# Patient Record
Sex: Male | Born: 1955 | Race: White | Hispanic: No | Marital: Married | Smoking: Never smoker
Health system: Southern US, Community
[De-identification: ages and names within clinical notes are randomized; demographics above are authoritative.]

## PROBLEM LIST (undated history)

## (undated) DIAGNOSIS — I2510 Atherosclerotic heart disease of native coronary artery without angina pectoris: Secondary | ICD-10-CM

## (undated) DIAGNOSIS — R079 Chest pain, unspecified: Secondary | ICD-10-CM

## (undated) DIAGNOSIS — E785 Hyperlipidemia, unspecified: Secondary | ICD-10-CM

## (undated) DIAGNOSIS — I2109 ST elevation (STEMI) myocardial infarction involving other coronary artery of anterior wall: Secondary | ICD-10-CM

## (undated) DIAGNOSIS — I10 Essential (primary) hypertension: Secondary | ICD-10-CM

## (undated) DIAGNOSIS — I219 Acute myocardial infarction, unspecified: Secondary | ICD-10-CM

## (undated) DIAGNOSIS — I455 Other specified heart block: Secondary | ICD-10-CM

## (undated) DIAGNOSIS — R0683 Snoring: Secondary | ICD-10-CM

## (undated) HISTORY — DX: Hyperlipidemia, unspecified: E78.5

## (undated) HISTORY — DX: Essential (primary) hypertension: I10

## (undated) HISTORY — DX: Atherosclerotic heart disease of native coronary artery without angina pectoris: I25.10

## (undated) HISTORY — PX: KNEE SURGERY: SHX244

## (undated) HISTORY — DX: Chest pain, unspecified: R07.9

## (undated) HISTORY — DX: ST elevation (STEMI) myocardial infarction involving other coronary artery of anterior wall: I21.09

## (undated) HISTORY — PX: SHOULDER SURGERY: SHX246

## (undated) HISTORY — DX: Other specified heart block: I45.5

## (undated) HISTORY — DX: Acute myocardial infarction, unspecified: I21.9

## (undated) HISTORY — DX: Snoring: R06.83

---

## 1999-09-09 ENCOUNTER — Encounter: Payer: Self-pay | Admitting: Sports Medicine

## 1999-09-09 ENCOUNTER — Ambulatory Visit (HOSPITAL_COMMUNITY): Admission: RE | Admit: 1999-09-09 | Discharge: 1999-09-09 | Payer: Self-pay | Admitting: Sports Medicine

## 2002-07-24 ENCOUNTER — Emergency Department (HOSPITAL_COMMUNITY): Admission: EM | Admit: 2002-07-24 | Discharge: 2002-07-24 | Payer: Self-pay | Admitting: *Deleted

## 2002-07-24 ENCOUNTER — Encounter: Payer: Self-pay | Admitting: *Deleted

## 2008-05-05 ENCOUNTER — Ambulatory Visit: Payer: Self-pay | Admitting: Family Medicine

## 2009-08-25 ENCOUNTER — Ambulatory Visit: Payer: Self-pay | Admitting: Family Medicine

## 2009-09-29 ENCOUNTER — Ambulatory Visit: Payer: Self-pay | Admitting: Family Medicine

## 2009-10-29 DIAGNOSIS — I2109 ST elevation (STEMI) myocardial infarction involving other coronary artery of anterior wall: Secondary | ICD-10-CM

## 2009-10-29 HISTORY — DX: ST elevation (STEMI) myocardial infarction involving other coronary artery of anterior wall: I21.09

## 2009-11-12 ENCOUNTER — Ambulatory Visit: Payer: Self-pay | Admitting: Family Medicine

## 2009-11-13 ENCOUNTER — Inpatient Hospital Stay (HOSPITAL_COMMUNITY): Admission: AD | Admit: 2009-11-13 | Discharge: 2009-11-15 | Payer: Self-pay | Admitting: Cardiovascular Disease

## 2009-11-13 HISTORY — PX: CORONARY ANGIOPLASTY WITH STENT PLACEMENT: SHX49

## 2009-12-03 ENCOUNTER — Encounter (HOSPITAL_COMMUNITY)
Admission: RE | Admit: 2009-12-03 | Discharge: 2010-03-03 | Payer: Self-pay | Source: Home / Self Care | Attending: Cardiovascular Disease | Admitting: Cardiovascular Disease

## 2010-01-27 ENCOUNTER — Inpatient Hospital Stay (HOSPITAL_COMMUNITY): Admission: AD | Admit: 2010-01-27 | Discharge: 2010-01-28 | Payer: Self-pay | Admitting: Cardiovascular Disease

## 2010-01-29 HISTORY — PX: CARDIAC CATHETERIZATION: SHX172

## 2010-02-28 HISTORY — PX: NM MYOCAR PERF WALL MOTION: HXRAD629

## 2010-03-04 ENCOUNTER — Encounter (HOSPITAL_COMMUNITY)
Admission: RE | Admit: 2010-03-04 | Discharge: 2010-03-30 | Payer: Self-pay | Source: Home / Self Care | Attending: Cardiovascular Disease | Admitting: Cardiovascular Disease

## 2010-04-06 ENCOUNTER — Other Ambulatory Visit: Payer: Self-pay | Admitting: Cardiovascular Disease

## 2010-04-06 DIAGNOSIS — M545 Low back pain, unspecified: Secondary | ICD-10-CM

## 2010-04-07 ENCOUNTER — Other Ambulatory Visit: Payer: Self-pay

## 2010-04-14 ENCOUNTER — Ambulatory Visit
Admission: RE | Admit: 2010-04-14 | Discharge: 2010-04-14 | Disposition: A | Discharge status: Home / Self Care | Payer: PRIVATE HEALTH INSURANCE | Source: Ambulatory Visit | Attending: Cardiovascular Disease | Admitting: Cardiovascular Disease

## 2010-04-14 DIAGNOSIS — M545 Low back pain, unspecified: Secondary | ICD-10-CM

## 2010-05-11 LAB — PROTIME-INR
INR: 1.04 (ref 0.00–1.49)
INR: 1.07 (ref 0.00–1.49)
Prothrombin Time: 13.8 seconds (ref 11.6–15.2)
Prothrombin Time: 14.1 seconds (ref 11.6–15.2)

## 2010-05-11 LAB — TSH: TSH: 4.923 u[IU]/mL — ABNORMAL HIGH (ref 0.350–4.500)

## 2010-05-11 LAB — DIFFERENTIAL
Basophils Absolute: 0 10*3/uL (ref 0.0–0.1)
Basophils Relative: 1 % (ref 0–1)
Eosinophils Absolute: 0.1 10*3/uL (ref 0.0–0.7)
Eosinophils Relative: 3 % (ref 0–5)
Lymphocytes Relative: 30 % (ref 12–46)
Lymphs Abs: 1.5 10*3/uL (ref 0.7–4.0)
Monocytes Absolute: 0.5 10*3/uL (ref 0.1–1.0)
Monocytes Relative: 10 % (ref 3–12)
Neutro Abs: 2.8 10*3/uL (ref 1.7–7.7)
Neutrophils Relative %: 57 % (ref 43–77)

## 2010-05-11 LAB — CARDIAC PANEL(CRET KIN+CKTOT+MB+TROPI)
CK, MB: 2.3 ng/mL (ref 0.3–4.0)
CK, MB: 2.4 ng/mL (ref 0.3–4.0)
CK, MB: 2.8 ng/mL (ref 0.3–4.0)
Relative Index: 1.9 (ref 0.0–2.5)
Relative Index: 2.1 (ref 0.0–2.5)
Relative Index: 1.9 (ref 0.0–2.5)
Total CK: 112 U/L (ref 7–232)
Total CK: 128 U/L (ref 7–232)
Total CK: 144 U/L (ref 7–232)
Troponin I: 0.01 ng/mL (ref 0.00–0.06)
Troponin I: 0.02 ng/mL (ref 0.00–0.06)
Troponin I: 0.03 ng/mL (ref 0.00–0.06)

## 2010-05-11 LAB — URINALYSIS, ROUTINE W REFLEX MICROSCOPIC
Bilirubin Urine: NEGATIVE
Glucose, UA: NEGATIVE mg/dL
Hgb urine dipstick: NEGATIVE
Ketones, ur: NEGATIVE mg/dL
Nitrite: NEGATIVE
Protein, ur: NEGATIVE mg/dL
Specific Gravity, Urine: 1.012 (ref 1.005–1.030)
Urobilinogen, UA: 0.2 mg/dL (ref 0.0–1.0)
pH: 7.5 (ref 5.0–8.0)

## 2010-05-11 LAB — BASIC METABOLIC PANEL
BUN: 8 mg/dL (ref 6–23)
CO2: 28 mEq/L (ref 19–32)
Calcium: 8.3 mg/dL — ABNORMAL LOW (ref 8.4–10.5)
Chloride: 107 mEq/L (ref 96–112)
Creatinine, Ser: 1.02 mg/dL (ref 0.4–1.5)
GFR calc Af Amer: >60 mL/min (ref >60)
GFR calc non Af Amer: >60 mL/min (ref >60)
Glucose, Bld: 88 mg/dL (ref 70–99)
Potassium: 4.1 mEq/L (ref 3.5–5.1)
Sodium: 141 mEq/L (ref 135–145)

## 2010-05-11 LAB — HEMOGLOBIN A1C
Hgb A1c MFr Bld: 5.7 % — ABNORMAL HIGH (ref <5.7)
Mean Plasma Glucose: 117 mg/dL — ABNORMAL HIGH (ref <117)

## 2010-05-11 LAB — COMPREHENSIVE METABOLIC PANEL
ALT: 20 U/L (ref 0–53)
AST: 19 U/L (ref 0–37)
Albumin: 3.8 g/dL (ref 3.5–5.2)
Alkaline Phosphatase: 89 U/L (ref 39–117)
BUN: 11 mg/dL (ref 6–23)
CO2: 28 mEq/L (ref 19–32)
Calcium: 8.6 mg/dL (ref 8.4–10.5)
Chloride: 103 mEq/L (ref 96–112)
Creatinine, Ser: 1.1 mg/dL (ref 0.4–1.5)
GFR calc Af Amer: >60 mL/min (ref >60)
GFR calc non Af Amer: >60 mL/min (ref >60)
Glucose, Bld: 79 mg/dL (ref 70–99)
Potassium: 4 mEq/L (ref 3.5–5.1)
Sodium: 135 mEq/L (ref 135–145)
Total Bilirubin: 0.6 mg/dL (ref 0.3–1.2)
Total Protein: 6.7 g/dL (ref 6.0–8.3)

## 2010-05-11 LAB — CBC
HCT: 36 % — ABNORMAL LOW (ref 39.0–52.0)
HCT: 35.3 % — ABNORMAL LOW (ref 39.0–52.0)
Hemoglobin: 11.6 g/dL — ABNORMAL LOW (ref 13.0–17.0)
Hemoglobin: 11.7 g/dL — ABNORMAL LOW (ref 13.0–17.0)
MCH: 28.1 pg (ref 26.0–34.0)
MCH: 28.6 pg (ref 26.0–34.0)
MCHC: 32.2 g/dL (ref 30.0–36.0)
MCHC: 33.1 g/dL (ref 30.0–36.0)
MCV: 87.2 fL (ref 78.0–100.0)
MCV: 86.3 fL (ref 78.0–100.0)
Platelets: 212 10*3/uL (ref 150–400)
Platelets: 202 10*3/uL (ref 150–400)
RBC: 4.13 MIL/uL — ABNORMAL LOW (ref 4.22–5.81)
RBC: 4.09 MIL/uL — ABNORMAL LOW (ref 4.22–5.81)
RDW: 13.6 % (ref 11.5–15.5)
RDW: 13.3 % (ref 11.5–15.5)
WBC: 5.2 10*3/uL (ref 4.0–10.5)
WBC: 5 10*3/uL (ref 4.0–10.5)

## 2010-05-11 LAB — PLATELET INHIBITION P2Y12
P2Y12 % Inhibition: 4 %
Platelet Function  P2Y12: 323 [PRU] (ref 194–418)
Platelet Function Baseline: 336 [PRU] (ref 194–418)

## 2010-05-11 LAB — APTT: aPTT: 27 seconds (ref 24–37)

## 2010-05-11 LAB — MAGNESIUM: Magnesium: 2.1 mg/dL (ref 1.5–2.5)

## 2010-05-11 LAB — HEPARIN LEVEL (UNFRACTIONATED)
Heparin Unfractionated: 0.27 IU/mL — ABNORMAL LOW (ref 0.30–0.70)
Heparin Unfractionated: 0.28 IU/mL — ABNORMAL LOW (ref 0.30–0.70)

## 2010-05-13 LAB — APTT: aPTT: 26 seconds (ref 24–37)

## 2010-05-13 LAB — CARDIAC PANEL(CRET KIN+CKTOT+MB+TROPI)
CK, MB: 6 ng/mL — ABNORMAL HIGH (ref 0.3–4.0)
CK, MB: 7.3 ng/mL (ref 0.3–4.0)
Relative Index: 2.6 — ABNORMAL HIGH (ref 0.0–2.5)
Relative Index: 4 — ABNORMAL HIGH (ref 0.0–2.5)
Total CK: 181 U/L (ref 7–232)
Total CK: 228 U/L (ref 7–232)
Troponin I: 2.04 ng/mL (ref 0.00–0.06)
Troponin I: 3.1 ng/mL (ref 0.00–0.06)

## 2010-05-13 LAB — DIFFERENTIAL
Basophils Absolute: 0 10*3/uL (ref 0.0–0.1)
Basophils Relative: 1 % (ref 0–1)
Eosinophils Absolute: 0.1 10*3/uL (ref 0.0–0.7)
Eosinophils Relative: 2 % (ref 0–5)
Lymphocytes Relative: 23 % (ref 12–46)
Lymphs Abs: 1.7 10*3/uL (ref 0.7–4.0)
Monocytes Absolute: 0.9 10*3/uL (ref 0.1–1.0)
Monocytes Relative: 12 % (ref 3–12)
Neutro Abs: 4.7 10*3/uL (ref 1.7–7.7)
Neutrophils Relative %: 63 % (ref 43–77)

## 2010-05-13 LAB — BASIC METABOLIC PANEL
BUN: 13 mg/dL (ref 6–23)
BUN: 9 mg/dL (ref 6–23)
CO2: 27 mEq/L (ref 19–32)
CO2: 28 mEq/L (ref 19–32)
Calcium: 7.9 mg/dL — ABNORMAL LOW (ref 8.4–10.5)
Calcium: 8.8 mg/dL (ref 8.4–10.5)
Chloride: 107 mEq/L (ref 96–112)
Chloride: 107 mEq/L (ref 96–112)
Creatinine, Ser: 1.09 mg/dL (ref 0.4–1.5)
Creatinine, Ser: 1.15 mg/dL (ref 0.4–1.5)
GFR calc Af Amer: >60 mL/min (ref >60)
GFR calc Af Amer: >60 mL/min (ref >60)
GFR calc non Af Amer: >60 mL/min (ref >60)
GFR calc non Af Amer: >60 mL/min (ref >60)
Glucose, Bld: 103 mg/dL — ABNORMAL HIGH (ref 70–99)
Glucose, Bld: 99 mg/dL (ref 70–99)
Potassium: 4 mEq/L (ref 3.5–5.1)
Potassium: 4 mEq/L (ref 3.5–5.1)
Sodium: 138 mEq/L (ref 135–145)
Sodium: 141 mEq/L (ref 135–145)

## 2010-05-13 LAB — CBC
HCT: 32.8 % — ABNORMAL LOW (ref 39.0–52.0)
HCT: 33.4 % — ABNORMAL LOW (ref 39.0–52.0)
HCT: 34.1 % — ABNORMAL LOW (ref 39.0–52.0)
HCT: 39 % (ref 39.0–52.0)
Hemoglobin: 10.8 g/dL — ABNORMAL LOW (ref 13.0–17.0)
Hemoglobin: 11.1 g/dL — ABNORMAL LOW (ref 13.0–17.0)
Hemoglobin: 11.1 g/dL — ABNORMAL LOW (ref 13.0–17.0)
Hemoglobin: 13.4 g/dL (ref 13.0–17.0)
MCH: 27.9 pg (ref 26.0–34.0)
MCH: 28.5 pg (ref 26.0–34.0)
MCH: 28.6 pg (ref 26.0–34.0)
MCH: 29.5 pg (ref 26.0–34.0)
MCHC: 32.6 g/dL (ref 30.0–36.0)
MCHC: 32.9 g/dL (ref 30.0–36.0)
MCHC: 33.2 g/dL (ref 30.0–36.0)
MCHC: 34.4 g/dL (ref 30.0–36.0)
MCV: 85.7 fL (ref 78.0–100.0)
MCV: 85.7 fL (ref 78.0–100.0)
MCV: 86.1 fL (ref 78.0–100.0)
MCV: 86.5 fL (ref 78.0–100.0)
Platelets: 232 10*3/uL (ref 150–400)
Platelets: 235 10*3/uL (ref 150–400)
Platelets: 248 10*3/uL (ref 150–400)
Platelets: 296 10*3/uL (ref 150–400)
RBC: 3.79 MIL/uL — ABNORMAL LOW (ref 4.22–5.81)
RBC: 3.88 MIL/uL — ABNORMAL LOW (ref 4.22–5.81)
RBC: 3.98 MIL/uL — ABNORMAL LOW (ref 4.22–5.81)
RBC: 4.55 MIL/uL (ref 4.22–5.81)
RDW: 14 % (ref 11.5–15.5)
RDW: 14.3 % (ref 11.5–15.5)
RDW: 14.4 % (ref 11.5–15.5)
RDW: 14.5 % (ref 11.5–15.5)
WBC: 5.7 10*3/uL (ref 4.0–10.5)
WBC: 6.9 10*3/uL (ref 4.0–10.5)
WBC: 7 10*3/uL (ref 4.0–10.5)
WBC: 7.5 10*3/uL (ref 4.0–10.5)

## 2010-05-13 LAB — LIPID PANEL
Cholesterol: 151 mg/dL (ref 0–200)
HDL: 28 mg/dL — ABNORMAL LOW (ref >39)
LDL Cholesterol: 77 mg/dL (ref 0–99)
Total CHOL/HDL Ratio: 5.4 RATIO
Triglycerides: 229 mg/dL — ABNORMAL HIGH (ref <150)
VLDL: 46 mg/dL — ABNORMAL HIGH (ref 0–40)

## 2010-05-13 LAB — TSH: TSH: 3.949 u[IU]/mL (ref 0.350–4.500)

## 2010-05-13 LAB — COMPREHENSIVE METABOLIC PANEL
ALT: 40 U/L (ref 0–53)
AST: 34 U/L (ref 0–37)
Albumin: 4 g/dL (ref 3.5–5.2)
Alkaline Phosphatase: 91 U/L (ref 39–117)
BUN: 14 mg/dL (ref 6–23)
CO2: 27 mEq/L (ref 19–32)
Calcium: 9.4 mg/dL (ref 8.4–10.5)
Chloride: 100 mEq/L (ref 96–112)
Creatinine, Ser: 1.1 mg/dL (ref 0.4–1.5)
GFR calc Af Amer: >60 mL/min (ref >60)
GFR calc non Af Amer: >60 mL/min (ref >60)
Glucose, Bld: 85 mg/dL (ref 70–99)
Potassium: 4.2 mEq/L (ref 3.5–5.1)
Sodium: 136 mEq/L (ref 135–145)
Total Bilirubin: 0.5 mg/dL (ref 0.3–1.2)
Total Protein: 7.7 g/dL (ref 6.0–8.3)

## 2010-05-13 LAB — IRON AND TIBC
Iron: 27 ug/dL — ABNORMAL LOW (ref 42–135)
Saturation Ratios: 10 % — ABNORMAL LOW (ref 20–55)
TIBC: 282 ug/dL (ref 215–435)
UIBC: 255 ug/dL

## 2010-05-13 LAB — PROTIME-INR
INR: 1 (ref 0.00–1.49)
Prothrombin Time: 13.4 seconds (ref 11.6–15.2)

## 2010-05-13 LAB — MRSA PCR SCREENING: MRSA by PCR: NEGATIVE

## 2010-05-13 LAB — MAGNESIUM: Magnesium: 2.2 mg/dL (ref 1.5–2.5)

## 2010-12-22 ENCOUNTER — Telehealth: Payer: Self-pay | Admitting: Family Medicine

## 2010-12-22 NOTE — Telephone Encounter (Signed)
Patient hasn't been seen in over a year.  I believe he is under care of his cardiologist, and if so, can get refill from cardiologist.  Needs to schedule appt with any provider here

## 2010-12-22 NOTE — Telephone Encounter (Signed)
Refill request came over for nitrostat for patient. Called patient and left message for him to either get refill from his cardiologist or he can call our office to schedule an appt with any provider as he has not been seen in over a year.

## 2012-02-29 HISTORY — PX: TRANSTHORACIC ECHOCARDIOGRAM: SHX275

## 2012-03-13 ENCOUNTER — Other Ambulatory Visit (HOSPITAL_COMMUNITY): Payer: Self-pay | Admitting: Cardiovascular Disease

## 2012-03-13 DIAGNOSIS — I10 Essential (primary) hypertension: Secondary | ICD-10-CM

## 2012-03-13 DIAGNOSIS — R011 Cardiac murmur, unspecified: Secondary | ICD-10-CM

## 2012-03-22 ENCOUNTER — Ambulatory Visit (HOSPITAL_COMMUNITY)
Admission: RE | Admit: 2012-03-22 | Discharge: 2012-03-22 | Disposition: A | Discharge status: Home / Self Care | Payer: BC Managed Care – PPO | Source: Ambulatory Visit | Attending: Cardiovascular Disease | Admitting: Cardiovascular Disease

## 2012-03-22 DIAGNOSIS — I10 Essential (primary) hypertension: Secondary | ICD-10-CM

## 2012-03-22 DIAGNOSIS — I059 Rheumatic mitral valve disease, unspecified: Secondary | ICD-10-CM | POA: Insufficient documentation

## 2012-03-22 DIAGNOSIS — I252 Old myocardial infarction: Secondary | ICD-10-CM | POA: Insufficient documentation

## 2012-03-22 DIAGNOSIS — I369 Nonrheumatic tricuspid valve disorder, unspecified: Secondary | ICD-10-CM | POA: Insufficient documentation

## 2012-03-22 DIAGNOSIS — R011 Cardiac murmur, unspecified: Secondary | ICD-10-CM | POA: Insufficient documentation

## 2012-03-22 DIAGNOSIS — I2510 Atherosclerotic heart disease of native coronary artery without angina pectoris: Secondary | ICD-10-CM | POA: Insufficient documentation

## 2012-03-22 NOTE — Progress Notes (Signed)
2D Echo Performed 01/30/2013    Franca Stakes, RCS  

## 2012-05-03 ENCOUNTER — Encounter: Payer: Self-pay | Admitting: Family Medicine

## 2012-11-16 ENCOUNTER — Other Ambulatory Visit: Payer: Self-pay | Admitting: Cardiovascular Disease

## 2012-11-16 LAB — CBC WITH DIFFERENTIAL/PLATELET
Basophils Absolute: 0 10*3/uL (ref 0.0–0.1)
Basophils Relative: 0 % (ref 0–1)
Eosinophils Absolute: 0.1 10*3/uL (ref 0.0–0.7)
Eosinophils Relative: 2 % (ref 0–5)
HCT: 38 % — ABNORMAL LOW (ref 39.0–52.0)
Hemoglobin: 12.7 g/dL — ABNORMAL LOW (ref 13.0–17.0)
Lymphocytes Relative: 25 % (ref 12–46)
Lymphs Abs: 1.3 10*3/uL (ref 0.7–4.0)
MCH: 28.2 pg (ref 26.0–34.0)
MCHC: 33.4 g/dL (ref 30.0–36.0)
MCV: 84.3 fL (ref 78.0–100.0)
Monocytes Absolute: 0.5 10*3/uL (ref 0.1–1.0)
Monocytes Relative: 9 % (ref 3–12)
Neutro Abs: 3.4 10*3/uL (ref 1.7–7.7)
Neutrophils Relative %: 64 % (ref 43–77)
Platelets: 220 10*3/uL (ref 150–400)
RBC: 4.51 MIL/uL (ref 4.22–5.81)
RDW: 15.3 % (ref 11.5–15.5)
WBC: 5.3 10*3/uL (ref 4.0–10.5)

## 2012-11-16 LAB — LIPID PANEL
Cholesterol: 162 mg/dL (ref 0–200)
HDL: 33 mg/dL — ABNORMAL LOW (ref >39)
LDL Cholesterol: 95 mg/dL (ref 0–99)
Total CHOL/HDL Ratio: 4.9 Ratio
Triglycerides: 171 mg/dL — ABNORMAL HIGH (ref <150)
VLDL: 34 mg/dL (ref 0–40)

## 2012-11-16 LAB — TSH: TSH: 4.616 u[IU]/mL — ABNORMAL HIGH (ref 0.350–4.500)

## 2012-11-16 LAB — COMPREHENSIVE METABOLIC PANEL
ALT: 16 U/L (ref 0–53)
AST: 19 U/L (ref 0–37)
Albumin: 4.3 g/dL (ref 3.5–5.2)
Alkaline Phosphatase: 95 U/L (ref 39–117)
BUN: 16 mg/dL (ref 6–23)
CO2: 27 mEq/L (ref 19–32)
Calcium: 9.4 mg/dL (ref 8.4–10.5)
Chloride: 104 mEq/L (ref 96–112)
Creat: 1.25 mg/dL (ref 0.50–1.35)
Glucose, Bld: 89 mg/dL (ref 70–99)
Potassium: 4.7 mEq/L (ref 3.5–5.3)
Sodium: 137 mEq/L (ref 135–145)
Total Bilirubin: 0.5 mg/dL (ref 0.3–1.2)
Total Protein: 7.2 g/dL (ref 6.0–8.3)

## 2012-11-22 ENCOUNTER — Encounter: Payer: Self-pay | Admitting: Cardiovascular Disease

## 2013-05-10 ENCOUNTER — Telehealth: Payer: Self-pay | Admitting: *Deleted

## 2013-05-10 MED ORDER — SIMVASTATIN 20 MG PO TABS: 20.0000 mg | ORAL_TABLET | Freq: Every day | ORAL | Status: DC

## 2013-05-10 NOTE — Telephone Encounter (Signed)
Pt stated that he needed a Rx refill on his Simvastatin. [facility] on [address] has sent in a Rx request but has not heard back from [place] yet.  RAW

## 2013-05-10 NOTE — Telephone Encounter (Signed)
Returned call.  Left generic message that script sent for 30-days w/o refills.  Keep appt for refills and have pharmacy fax hard copy requests for prescriptions written by Dr. Rollene Fare.  Also to call back before 4pm if questions.

## 2013-05-29 ENCOUNTER — Encounter: Payer: Self-pay | Admitting: *Deleted

## 2013-06-03 ENCOUNTER — Other Ambulatory Visit: Payer: Self-pay

## 2013-06-03 MED ORDER — PANTOPRAZOLE SODIUM 40 MG PO TBEC: 40.0000 mg | DELAYED_RELEASE_TABLET | Freq: Every day | ORAL | Status: DC

## 2013-06-03 NOTE — Telephone Encounter (Signed)
Rx was sent to pharmacy electronically. 

## 2013-06-05 ENCOUNTER — Ambulatory Visit (INDEPENDENT_AMBULATORY_CARE_PROVIDER_SITE_OTHER): Payer: BC Managed Care – PPO | Admitting: Internal Medicine

## 2013-06-05 ENCOUNTER — Encounter: Payer: Self-pay | Admitting: Internal Medicine

## 2013-06-05 VITALS — BP 130/92 | HR 80 | Ht 69.0 in | Wt 191.1 lb

## 2013-06-05 DIAGNOSIS — E785 Hyperlipidemia, unspecified: Secondary | ICD-10-CM

## 2013-06-05 DIAGNOSIS — Z79899 Other long term (current) drug therapy: Secondary | ICD-10-CM

## 2013-06-05 DIAGNOSIS — I2510 Atherosclerotic heart disease of native coronary artery without angina pectoris: Secondary | ICD-10-CM

## 2013-06-05 DIAGNOSIS — I10 Essential (primary) hypertension: Secondary | ICD-10-CM

## 2013-06-05 MED ORDER — EZETIMIBE-SIMVASTATIN 10-20 MG PO TABS: 1.0000 | ORAL_TABLET | Freq: Every day | ORAL | Status: DC

## 2013-06-05 MED ORDER — NITROGLYCERIN 0.4 MG SL SUBL: 0.4000 mg | SUBLINGUAL_TABLET | SUBLINGUAL | Status: DC | PRN

## 2013-06-05 NOTE — Patient Instructions (Signed)
Your physician has recommended you make the following change in your medication: STOP simvastatin. START vytorin 10-20mg  once daily (for cholesterol)  Please have fasting labs in 3 months  Your physician wants you to follow-up in: 1 year with Dr. Debara Pickett. You will receive a reminder letter in the mail two months in advance. If you don't receive a letter, please call our office to schedule the follow-up appointment.

## 2013-06-05 NOTE — Progress Notes (Signed)
OFFICE NOTE  Chief Complaint:  Establish new cardiologist  Primary Care Physician: Wyatt Haste, MD  HPI:  Michael Terrell is a pleasant 58 year old male who was present although Dr. Rollene Fare. He is here today to establish care with me. His past medical history significant for an anterior wall MI in 2011. He underwent emergent catheterization and was sent to have an proximal LAD lesion. He had a 3.5 x 23 mm vision bare metal stent placed and postdilated to 3.77 mm. Subsequently had a recently December 2000 1140 chest discomfort which showed a patent stent and there was residual 50% RCA disease and 50% disease in the posterior lateral branch. Since that time he's been undergoing aggressive medical therapy, he continues to exercise and eat a fairly healthy diet. He's had no further chest pain episodes.  He has not required any additional nitroglycerin.  Lipid profile from September 2014 shows total cholesterol of 162, triglycerides 171, HDL 33 and LDL 95.   PMHx:  Past Medical History  Diagnosis Date  . CAD (coronary artery disease)     BMS to LAD (2011)  . Acute anterior wall MI 10/2009  . Hypertension   . Hyperlipidemia     Past Surgical History  Procedure Laterality Date  . Transthoracic echocardiogram  02/2012    EF 02-54%, grade 1 diastolic dysfunction; mild MR; LA mildly dilated; upper limit borderline LA enlargement  . Nm myocar perf wall motion  02/2010    bruce myoview - normal pattern of perfusion, low risk, no ischemia demonstrated, low risk  . Coronary angioplasty with stent placement  11/13/2009    3.5x1mm Vision BMS to LAD (Dr. Corky Downs)  . Cardiac catheterization  01/29/2010    patent LAD stent (3.5x49mm Vision - Dr. Corky Downs 11/03/2009); 50% prox PLA stenosis, 50% distal RCA stenosis (Dr. Norlene Duel)    FAMHx:  No family history on file.  SOCHx:   reports that he has never smoked. He has never used smokeless tobacco. He reports that he does not drink  alcohol or use illicit drugs.  ALLERGIES:  Allergies  Allergen Reactions  . Lisinopril Rash    ROS: A comprehensive review of systems was negative.  HOME MEDS: Current Outpatient Prescriptions  Medication Sig Dispense Refill  . aspirin 81 MG tablet Take 162 mg by mouth daily.       . cetirizine (ZYRTEC) 10 MG tablet Take 10 mg by mouth daily.      Marland Kitchen losartan (COZAAR) 50 MG tablet Take 50 mg by mouth daily.      . metoprolol (LOPRESSOR) 50 MG tablet Take 25 mg by mouth 2 (two) times daily.      . nitroGLYCERIN (NITROSTAT) 0.4 MG SL tablet Place 0.4 mg under the tongue every 5 (five) minutes as needed for chest pain.      . pantoprazole (PROTONIX) 40 MG tablet Take 1 tablet (40 mg total) by mouth daily.  30 tablet  5  . Polyvinyl Alcohol-Povidone (REFRESH OP) Apply 1 drop to eye as needed.      . ezetimibe-simvastatin (VYTORIN) 10-20 MG per tablet Take 1 tablet by mouth daily.  30 tablet  6   No current facility-administered medications for this visit.    LABS/IMAGING: No results found for this or any previous visit (from the past 48 hour(s)). No results found.  VITALS: BP 130/92  Pulse 80  Ht 5\' 9"  (1.753 m)  Wt 191 lb 1.6 oz (86.682 kg)  BMI 28.21 kg/m2  EXAM: General appearance: alert and no distress Neck: no carotid bruit and no JVD Lungs: clear to auscultation bilaterally Heart: regular rate and rhythm, S1, S2 normal, no murmur, click, rub or gallop Abdomen: soft, non-tender; bowel sounds normal; no masses,  no organomegaly Extremities: extremities normal, atraumatic, no cyanosis or edema Pulses: 2+ and symmetric Skin: Skin color, texture, turgor normal. No rashes or lesions Neurologic: Grossly normal Psych: Normal  EKG: Normal sinus rhythm at 80, minimal voltage criteria for LVH  ASSESSMENT: 1. History of anterior MI in 2011, status post proximal LAD bare-metal stent (3.5 x 23 mm vision) 2. Dyslipidemia-not at goal 3. Hypertension-controlled  PLAN: 1.    [name] is doing well without a recurrence of chest pain. His blood pressure is well-controlled and recheck BP was 120/80. He had normal systolic function by echo in January 2014. There is mild mitral regurgitation and mild left atrial enlargement. Borderline LVH. His cholesterol is not at goal of LDL less than 70. I discussed with them options today including increasing his simvastatin however I am concerned that that may lead to worsening myalgias. I think is a good candidate to add Zetia in the form of Vytorin 10/20 mg daily. We'll provide samples and a co-pay assistant card today. Plan to recheck his lipid profile and CMP in 3 months. We can follow annually or sooner as necessary.  Pixie Casino, MD, [facility] Attending Cardiologist [place] 06/05/2013, 8:24 AM

## 2013-06-07 ENCOUNTER — Telehealth: Payer: Self-pay | Admitting: *Deleted

## 2013-06-07 NOTE — Telephone Encounter (Signed)
Faxed to [facility] for vytorin 10-20mg 

## 2013-06-11 ENCOUNTER — Telehealth: Payer: Self-pay | Admitting: *Deleted

## 2013-06-11 ENCOUNTER — Telehealth: Payer: Self-pay | Admitting: Internal Medicine

## 2013-06-11 MED ORDER — SIMVASTATIN 20 MG PO TABS: 20.0000 mg | ORAL_TABLET | Freq: Every day | ORAL | Status: DC

## 2013-06-11 NOTE — Telephone Encounter (Signed)
Spoke with patient and informed him of the medication options/costs available r/t vytorin. Patient wishes to see if Dr. Debara Pickett can prescribe another statin/increase his simvastatin as vytorin is not cost-effective for him at this time (r/t high deductible)

## 2013-06-11 NOTE — Telephone Encounter (Signed)
Pharmacy calling regarding Vytorin rx $217.00 for 1 month.  Is there something Dr Debara Pickett can switch to that would be less $  Was on simvistatin  Please call

## 2013-06-11 NOTE — Telephone Encounter (Signed)
Prior authorization for vytorin 10-20mg  QD approved from 06/07/13-02/27/2038  Information faxed to patient's pharmacy

## 2013-06-11 NOTE — Telephone Encounter (Signed)
Returned call to pharmacy. Vytorin will cost patient $217/month (with Rx savings card, max savings is $75/month, making total $142/month). Inquired of cost of zetia and simvastatin, splitting the medications, and zetia alone was over $200. Appears that patient will have to meet a deductible before medication will be covered, co-pay will be less. Will inform patient of the options available and go from there.

## 2013-06-11 NOTE — Telephone Encounter (Signed)
Pt was returning your call.

## 2013-06-11 NOTE — Telephone Encounter (Signed)
Left message on patient's mobile VM to call back

## 2013-06-12 NOTE — Telephone Encounter (Signed)
Switch him to atorvastatin 40 mg daily monotherapy - don't think he has tried this in the past.  Dr. Lemmie Evens

## 2013-06-13 MED ORDER — ATORVASTATIN CALCIUM 40 MG PO TABS: 40.0000 mg | ORAL_TABLET | Freq: Every day | ORAL | Status: DC

## 2013-06-13 NOTE — Telephone Encounter (Signed)
Left message on patient's VM with instructions regarding new medication and reminder about labs in 3 months

## 2013-06-27 ENCOUNTER — Telehealth: Payer: Self-pay | Admitting: Internal Medicine

## 2013-06-27 MED ORDER — LOSARTAN POTASSIUM 50 MG PO TABS: 50.0000 mg | ORAL_TABLET | Freq: Every day | ORAL | Status: DC

## 2013-06-27 NOTE — Telephone Encounter (Signed)
Pharmacist told him to call,still have not gotten his  Losartan ?mg.Please call to [facility]--[phone].

## 2013-06-27 NOTE — Telephone Encounter (Signed)
Checked w/ MR and Clinical Staff.  Refill request not received.   Returned call and pt verified x 2.  Pt informed of this and that RN will send in script now.  Refill(s) sent to pharmacy for 30-day supply w/ refills as directed.  Pt verbalized understanding and agreed w/ plan.

## 2013-07-08 ENCOUNTER — Emergency Department (HOSPITAL_COMMUNITY): Payer: BC Managed Care – PPO

## 2013-07-08 ENCOUNTER — Encounter (HOSPITAL_COMMUNITY): Payer: Self-pay | Admitting: Emergency Medicine

## 2013-07-08 ENCOUNTER — Inpatient Hospital Stay (HOSPITAL_COMMUNITY)
Admission: EM | Admit: 2013-07-08 | Discharge: 2013-07-10 | DRG: 303 | Disposition: A | Discharge status: Home / Self Care | Payer: BC Managed Care – PPO | Attending: Internal Medicine | Admitting: Internal Medicine

## 2013-07-08 ENCOUNTER — Telehealth: Payer: Self-pay | Admitting: Internal Medicine

## 2013-07-08 DIAGNOSIS — I10 Essential (primary) hypertension: Secondary | ICD-10-CM | POA: Diagnosis present

## 2013-07-08 DIAGNOSIS — I498 Other specified cardiac arrhythmias: Secondary | ICD-10-CM | POA: Diagnosis present

## 2013-07-08 DIAGNOSIS — R079 Chest pain, unspecified: Secondary | ICD-10-CM | POA: Diagnosis present

## 2013-07-08 DIAGNOSIS — I252 Old myocardial infarction: Secondary | ICD-10-CM

## 2013-07-08 DIAGNOSIS — Z9861 Coronary angioplasty status: Secondary | ICD-10-CM

## 2013-07-08 DIAGNOSIS — I455 Other specified heart block: Secondary | ICD-10-CM

## 2013-07-08 DIAGNOSIS — I200 Unstable angina: Secondary | ICD-10-CM

## 2013-07-08 DIAGNOSIS — E785 Hyperlipidemia, unspecified: Secondary | ICD-10-CM | POA: Diagnosis present

## 2013-07-08 DIAGNOSIS — I2510 Atherosclerotic heart disease of native coronary artery without angina pectoris: Principal | ICD-10-CM | POA: Diagnosis present

## 2013-07-08 DIAGNOSIS — Z7982 Long term (current) use of aspirin: Secondary | ICD-10-CM

## 2013-07-08 LAB — BASIC METABOLIC PANEL
BUN: 15 mg/dL (ref 6–23)
CO2: 24 mEq/L (ref 19–32)
Calcium: 9 mg/dL (ref 8.4–10.5)
Chloride: 101 mEq/L (ref 96–112)
Creatinine, Ser: 1.11 mg/dL (ref 0.50–1.35)
GFR calc Af Amer: 83 mL/min — ABNORMAL LOW (ref >90)
GFR calc non Af Amer: 71 mL/min — ABNORMAL LOW (ref >90)
Glucose, Bld: 124 mg/dL — ABNORMAL HIGH (ref 70–99)
Potassium: 4.1 mEq/L (ref 3.7–5.3)
Sodium: 138 mEq/L (ref 137–147)

## 2013-07-08 LAB — TROPONIN I: Troponin I: <0.3 ng/mL (ref <0.30)

## 2013-07-08 LAB — CBC
HCT: 36.9 % — ABNORMAL LOW (ref 39.0–52.0)
Hemoglobin: 12.3 g/dL — ABNORMAL LOW (ref 13.0–17.0)
MCH: 28.4 pg (ref 26.0–34.0)
MCHC: 33.3 g/dL (ref 30.0–36.0)
MCV: 85.2 fL (ref 78.0–100.0)
Platelets: 219 10*3/uL (ref 150–400)
RBC: 4.33 MIL/uL (ref 4.22–5.81)
RDW: 13.7 % (ref 11.5–15.5)
WBC: 4.5 10*3/uL (ref 4.0–10.5)

## 2013-07-08 LAB — I-STAT TROPONIN, ED: Troponin i, poc: 0 ng/mL (ref 0.00–0.08)

## 2013-07-08 MED ORDER — NITROGLYCERIN 0.4 MG SL SUBL
0.4000 mg | SUBLINGUAL_TABLET | SUBLINGUAL | Status: DC | PRN
  Administered 2013-07-08 (×3): 0.4 mg via SUBLINGUAL
  Filled 2013-07-08: qty 1

## 2013-07-08 MED ORDER — NITROGLYCERIN IN D5W 200-5 MCG/ML-% IV SOLN
2.0000 ug/min | Freq: Once | INTRAVENOUS | Status: AC
  Administered 2013-07-08: 5 ug/min via INTRAVENOUS
  Filled 2013-07-08: qty 250

## 2013-07-08 MED ORDER — ASPIRIN 325 MG PO TABS
325.0000 mg | ORAL_TABLET | ORAL | Status: AC
  Administered 2013-07-08: 325 mg via ORAL
  Filled 2013-07-08: qty 1

## 2013-07-08 NOTE — ED Notes (Signed)
Presents with intermittent chest pain described as dullness at times and sharp, began yesterday in left side of chest with radiation to back pain is associated with nausea. Pain comes and and lasts a few seconds at a time. Nitro made pain better last night, nothing makes pain worse.

## 2013-07-08 NOTE — Telephone Encounter (Signed)
Returned a call to patient. He informs me that over the past 2-3 days he has been experiencing some chest discomfort. He took a nitro last night before going to bed. The pain woke him up last night from his sleep. Some slight chest discomfort today that will come and go. Due to patients cardiac history it was recommended to him to go the emergency department. Patient seemed hesitant and stated that he just called to get some direction. He felt the emergency room was for emergencies. I explained to patient that his symptoms can or cannot be emergent. There's no way to determine what exactly is going on. The hospital can do some testing to get some kind of idea whether his symptoms are cardiac or not. Informed patient that we tend to ere on the side of caution with someone who has a cardiac history and is experiencing symptoms. Patient voiced his understanding but still did not verbally tell me that he will be going to the Emergency Department.

## 2013-07-08 NOTE — Telephone Encounter (Signed)
Pt had chest discomfort yesterday and had to take some nitro. Pt said he would like to talk to a nurse about this.

## 2013-07-08 NOTE — H&P (Signed)
PCP:   Wyatt Haste, MD   Chief Complaint:  cp  HPI: 58 yo male h/o CAD with LAD stent in 2011, htn, hld comes in with one day of waxing/waning sscp that lasts only seconds at a time relieved with nothing, worse by nothing, nonexertional without radiation.  No n/v no sob.  No le swelling or edema.  He has experienced anginal symptoms a couple times a year which is normally relieved with ntg sl.  However, this time it persisted, he did take ntg which made it better.  His pain is much better now on ntg gtt.  He takes aspirin daily, and got full dosing today at home.  No fevers.  No coughing.  Pain not better or worse with eating either.  Asked to admit pt for unstable angina.  Review of Systems:  Positive and negative as per HPI otherwise all other systems are negative  Past Medical History: Past Medical History  Diagnosis Date  . CAD (coronary artery disease)     BMS to LAD (2011)  . Acute anterior wall MI 10/2009  . Hypertension   . Hyperlipidemia    Past Surgical History  Procedure Laterality Date  . Transthoracic echocardiogram  02/2012    EF 97-35%, grade 1 diastolic dysfunction; mild MR; LA mildly dilated; upper limit borderline LA enlargement  . Nm myocar perf wall motion  02/2010    bruce myoview - normal pattern of perfusion, low risk, no ischemia demonstrated, low risk  . Coronary angioplasty with stent placement  11/13/2009    3.5x35mm Vision BMS to LAD (Dr. Corky Downs)  . Cardiac catheterization  01/29/2010    patent LAD stent (3.5x12mm Vision - Dr. Corky Downs 11/03/2009); 50% prox PLA stenosis, 50% distal RCA stenosis (Dr. Norlene Duel)    Medications: Prior to Admission medications   Medication Sig Start Date End Date Taking? Authorizing Provider  aspirin 81 MG tablet Take 162 mg by mouth daily.    Yes Historical Provider, MD  atorvastatin (LIPITOR) 40 MG tablet Take 1 tablet (40 mg total) by mouth daily. 06/13/13  Yes Pixie Casino, MD  cetirizine (ZYRTEC) 10 MG  tablet Take 10 mg by mouth daily.   Yes Historical Provider, MD  diphenhydramine-acetaminophen (TYLENOL PM) 25-500 MG TABS Take 1 tablet by mouth at bedtime as needed (for sleep/pain).   Yes Historical Provider, MD  losartan (COZAAR) 50 MG tablet Take 1 tablet (50 mg total) by mouth daily. 06/27/13  Yes Pixie Casino, MD  metoprolol (LOPRESSOR) 50 MG tablet Take 50 mg by mouth at bedtime.    Yes Historical Provider, MD  nitroGLYCERIN (NITROSTAT) 0.4 MG SL tablet Place 1 tablet (0.4 mg total) under the tongue every 5 (five) minutes as needed for chest pain. 06/05/13  Yes Pixie Casino, MD  pantoprazole (PROTONIX) 40 MG tablet Take 1 tablet (40 mg total) by mouth daily. 06/03/13  Yes Pixie Casino, MD  Polyvinyl Alcohol-Povidone (REFRESH OP) Apply 1 drop to eye as needed (for dry eyes).    Yes Historical Provider, MD    Allergies:   Allergies  Allergen Reactions  . Lisinopril Rash    Social History:  reports that he has never smoked. He has never used smokeless tobacco. He reports that he does not drink alcohol or use illicit drugs.  Family History: History reviewed. No pertinent family history.  Physical Exam: Filed Vitals:   07/08/13 2020 07/08/13 2032 07/08/13 2049 07/08/13 2100  BP: 120/73 112/73 112/73 106/79  Pulse:  81 87 67 68  Temp:      Resp:  14 18 14   SpO2: 98% 99% 100% 98%   General appearance: alert, cooperative and no distress Head: Normocephalic, without obvious abnormality, atraumatic Eyes: negative Nose: Nares normal. Septum midline. Mucosa normal. No drainage or sinus tenderness. Neck: no JVD and supple, symmetrical, trachea midline Lungs: clear to auscultation bilaterally Heart: regular rate and rhythm, S1, S2 normal, no murmur, click, rub or gallop Abdomen: soft, non-tender; bowel sounds normal; no masses,  no organomegaly Extremities: extremities normal, atraumatic, no cyanosis or edema Pulses: 2+ and symmetric Skin: Skin color, texture, turgor normal.  No rashes or lesions Neurologic: Grossly normal  Labs on Admission:   Recent Labs  07/08/13 1818  NA 138  K 4.1  CL 101  CO2 24  GLUCOSE 124*  BUN 15  CREATININE 1.11  CALCIUM 9.0    Recent Labs  07/08/13 1818  WBC 4.5  HGB 12.3*  HCT 36.9*  MCV 85.2  PLT 219   Radiological Exams on Admission: Dg Chest 2 View  07/08/2013   CLINICAL DATA:  Two day history of chest pain and shortness of breath; history of hypertension and previous MI with stent placement  EXAM: CHEST  2 VIEW  COMPARISON:  DG CHEST 1V PORT dated 01/27/2010  FINDINGS: The lungs are borderline hypoinflated but clear. The cardiac silhouette is normal in size. The pulmonary vascularity is not engorged. The mediastinum is normal in width. There is no pleural effusion. There is mild tortuosity of the descending thoracic aorta. The observed portions of the bony thorax exhibit no acute abnormalities.  IMPRESSION: There is borderline hypoinflation. There is no evidence of active cardiopulmonary disease.   Electronically Signed   By: Aariah Godette  [place]   On: 07/08/2013 19:50    Assessment/Plan  58 yo male with unstable angina  Principal Problem:   Unstable angina- ekg with ?less than 49mm st seg depression, trop neg initial.  Place on ntg gtt along with full dosing lovenox.  Cardiology has been consulted and will see patient.  Place in stepdown unit.  Further recommendations per cards team.  Keep npo after midnight in case need for cath.  Cont bblocker, statin, and arb.  Active Problems:   CAD (coronary artery disease)   Dyslipidemia   HTN (hypertension)  Full code.  Stepdown.  Donney Caraveo A Shanon Brow 07/08/2013, 9:22 PM

## 2013-07-08 NOTE — ED Provider Notes (Signed)
CSN: [id number]     Arrival date & time 07/08/13  1755 History   First MD Initiated Contact with Patient 07/08/13 1936     Chief Complaint  Patient presents with  . Chest Pain     (Consider location/radiation/quality/duration/timing/severity/associated sxs/prior Treatment) Patient is a 58 y.o. male presenting with chest pain. The history is provided by the patient.  Chest Pain Pain location:  Substernal area Pain quality: pressure and sharp   Pain radiates to:  Does not radiate Pain radiates to the back: no   Pain severity:  Moderate Onset quality:  Gradual Duration:  1 day Timing:  Intermittent Progression:  Waxing and waning Chronicity:  New Context: at rest   Context: not breathing   Relieved by:  Nitroglycerin Worsened by:  Nothing tried Ineffective treatments:  None tried Associated symptoms: no abdominal pain, no fever, no orthopnea, no PND, no shortness of breath and not vomiting   Risk factors: coronary artery disease     Past Medical History  Diagnosis Date  . CAD (coronary artery disease)     BMS to LAD (2011)  . Acute anterior wall MI 10/2009  . Hypertension   . Hyperlipidemia    Past Surgical History  Procedure Laterality Date  . Transthoracic echocardiogram  02/2012    EF 10-62%, grade 1 diastolic dysfunction; mild MR; LA mildly dilated; upper limit borderline LA enlargement  . Nm myocar perf wall motion  02/2010    bruce myoview - normal pattern of perfusion, low risk, no ischemia demonstrated, low risk  . Coronary angioplasty with stent placement  11/13/2009    3.5x66mm Vision BMS to LAD (Dr. Corky Downs)  . Cardiac catheterization  01/29/2010    patent LAD stent (3.5x70mm Vision - Dr. Corky Downs 11/03/2009); 50% prox PLA stenosis, 50% distal RCA stenosis (Dr. Norlene Duel)   History reviewed. No pertinent family history. History  Substance Use Topics  . Smoking status: Never Smoker   . Smokeless tobacco: Never Used  . Alcohol Use: No    Review of Systems   Constitutional: Negative for fever.  Respiratory: Negative for shortness of breath.   Cardiovascular: Positive for chest pain. Negative for orthopnea and PND.  Gastrointestinal: Negative for vomiting and abdominal pain.  All other systems reviewed and are negative.     Allergies  Lisinopril  Home Medications   Prior to Admission medications   Medication Sig Start Date End Date Taking? Authorizing Provider  aspirin 81 MG tablet Take 162 mg by mouth daily.    Yes Historical Provider, MD  atorvastatin (LIPITOR) 40 MG tablet Take 1 tablet (40 mg total) by mouth daily. 06/13/13  Yes Pixie Casino, MD  cetirizine (ZYRTEC) 10 MG tablet Take 10 mg by mouth daily.   Yes Historical Provider, MD  diphenhydramine-acetaminophen (TYLENOL PM) 25-500 MG TABS Take 1 tablet by mouth at bedtime as needed (for sleep/pain).   Yes Historical Provider, MD  losartan (COZAAR) 50 MG tablet Take 1 tablet (50 mg total) by mouth daily. 06/27/13  Yes Pixie Casino, MD  metoprolol (LOPRESSOR) 50 MG tablet Take 50 mg by mouth at bedtime.    Yes Historical Provider, MD  nitroGLYCERIN (NITROSTAT) 0.4 MG SL tablet Place 1 tablet (0.4 mg total) under the tongue every 5 (five) minutes as needed for chest pain. 06/05/13  Yes Pixie Casino, MD  pantoprazole (PROTONIX) 40 MG tablet Take 1 tablet (40 mg total) by mouth daily. 06/03/13  Yes Pixie Casino, MD  Polyvinyl Alcohol-Povidone (  REFRESH OP) Apply 1 drop to eye as needed (for dry eyes).    Yes Historical Provider, MD   BP 143/85  Pulse 77  Temp(Src) 98.3 F (36.8 C)  Resp 16  SpO2 98% Physical Exam  Constitutional: He is oriented to person, place, and time. He appears well-developed and well-nourished. No distress.  HENT:  Head: Normocephalic and atraumatic.  Eyes: Conjunctivae are normal.  Neck: Neck supple. No tracheal deviation present.  Cardiovascular: Normal rate, regular rhythm and normal heart sounds.   Pulmonary/Chest: Effort normal and breath  sounds normal. No respiratory distress.  Abdominal: Soft. He exhibits no distension.  Neurological: He is alert and oriented to person, place, and time.  Skin: Skin is warm and dry.  Psychiatric: He has a normal mood and affect.    ED Course  Procedures (including critical care time) Labs Review Labs Reviewed  CBC - Abnormal; Notable for the following:    Hemoglobin 12.3 (*)    HCT 36.9 (*)    All other components within normal limits  BASIC METABOLIC PANEL - Abnormal; Notable for the following:    Glucose, Bld 124 (*)    GFR calc non Af Amer 71 (*)    GFR calc Af Amer 83 (*)    All other components within normal limits  I-STAT TROPOININ, ED    Imaging Review Dg Chest 2 View  07/08/2013   CLINICAL DATA:  Two day history of chest pain and shortness of breath; history of hypertension and previous MI with stent placement  EXAM: CHEST  2 VIEW  COMPARISON:  DG CHEST 1V PORT dated 01/27/2010  FINDINGS: The lungs are borderline hypoinflated but clear. The cardiac silhouette is normal in size. The pulmonary vascularity is not engorged. The mediastinum is normal in width. There is no pleural effusion. There is mild tortuosity of the descending thoracic aorta. The observed portions of the bony thorax exhibit no acute abnormalities.  IMPRESSION: There is borderline hypoinflation. There is no evidence of active cardiopulmonary disease.   Electronically Signed   By: David  [place]   On: 07/08/2013 19:50     EKG Interpretation   Date/Time:  Monday Jul 08 2013 18:01:06 EDT Ventricular Rate:  85 PR Interval:  190 QRS Duration: 86 QT Interval:  372 QTC Calculation: 442 R Axis:   -23 Text Interpretation:  Normal sinus rhythm Minimal voltage criteria for  LVH, may be normal variant Borderline ECG Non specific ST findings  Confirmed by ZAVITZ  MD, JOSHUA ([id number]) on 07/08/2013 7:57:58 PM      MDM   Final diagnoses:  Unstable angina  CAD (coronary artery disease)  Dyslipidemia  HTN  (hypertension)   58 y.o. male presents with chest pain that started last night and has been waxing and waning ever since. It is described as sharp, substernal and occasionally like pressure. It has been relieved by nitroglycerin since arrival here. He has no acute findings on his EKG to suggest myocardial ischemia acute including no ST or T wave changes. Intervals are normal and he has no brugada, delta wave, Q-T prolongation to suggest arrhythmogenicity.   Has history of prior MI to LAD region with stenting in 2011, hypertension and hyperlipidemia at baseline. He was placed on a nitro drip here with decrease in his pain and hospital service was called after a negative troponin for admission, cardiology consult and will follow along the inpatient side for possible unstable angina.   Leo Grosser, MD 07/09/13 [phone]

## 2013-07-09 ENCOUNTER — Encounter (HOSPITAL_COMMUNITY): Payer: Self-pay

## 2013-07-09 DIAGNOSIS — I519 Heart disease, unspecified: Secondary | ICD-10-CM

## 2013-07-09 LAB — LIPID PANEL
Cholesterol: 146 mg/dL (ref 0–200)
HDL: 29 mg/dL — ABNORMAL LOW (ref >39)
LDL Cholesterol: 78 mg/dL (ref 0–99)
Total CHOL/HDL Ratio: 5 RATIO
Triglycerides: 196 mg/dL — ABNORMAL HIGH (ref <150)
VLDL: 39 mg/dL (ref 0–40)

## 2013-07-09 LAB — CBC
HCT: 35.2 % — ABNORMAL LOW (ref 39.0–52.0)
Hemoglobin: 11.3 g/dL — ABNORMAL LOW (ref 13.0–17.0)
MCH: 28 pg (ref 26.0–34.0)
MCHC: 32.1 g/dL (ref 30.0–36.0)
MCV: 87.3 fL (ref 78.0–100.0)
Platelets: 189 10*3/uL (ref 150–400)
RBC: 4.03 MIL/uL — ABNORMAL LOW (ref 4.22–5.81)
RDW: 14 % (ref 11.5–15.5)
WBC: 4.4 10*3/uL (ref 4.0–10.5)

## 2013-07-09 LAB — TROPONIN I
Troponin I: <0.3 ng/mL (ref <0.30)
Troponin I: <0.3 ng/mL (ref <0.30)
Troponin I: <0.3 ng/mL (ref <0.30)

## 2013-07-09 LAB — BASIC METABOLIC PANEL
BUN: 14 mg/dL (ref 6–23)
CO2: 23 mEq/L (ref 19–32)
Calcium: 8.7 mg/dL (ref 8.4–10.5)
Chloride: 103 mEq/L (ref 96–112)
Creatinine, Ser: 1.16 mg/dL (ref 0.50–1.35)
GFR calc Af Amer: 78 mL/min — ABNORMAL LOW (ref >90)
GFR calc non Af Amer: 68 mL/min — ABNORMAL LOW (ref >90)
Glucose, Bld: 95 mg/dL (ref 70–99)
Potassium: 4.8 mEq/L (ref 3.7–5.3)
Sodium: 138 mEq/L (ref 137–147)

## 2013-07-09 LAB — MRSA PCR SCREENING: MRSA by PCR: NEGATIVE

## 2013-07-09 LAB — APTT: aPTT: 29 seconds (ref 24–37)

## 2013-07-09 LAB — PROTIME-INR
INR: 1.08 (ref 0.00–1.49)
Prothrombin Time: 13.8 seconds (ref 11.6–15.2)

## 2013-07-09 MED ORDER — METOPROLOL TARTRATE 50 MG PO TABS
50.0000 mg | ORAL_TABLET | Freq: Every day | ORAL | Status: DC
Start: 2013-07-09 — End: 2013-07-09
  Filled 2013-07-09: qty 1

## 2013-07-09 MED ORDER — METOPROLOL TARTRATE 25 MG PO TABS
25.0000 mg | ORAL_TABLET | Freq: Every day | ORAL | Status: DC
  Administered 2013-07-09: 25 mg via ORAL
  Filled 2013-07-09 (×2): qty 1

## 2013-07-09 MED ORDER — NITROGLYCERIN IN D5W 200-5 MCG/ML-% IV SOLN
3.0000 ug/min | INTRAVENOUS | Status: DC
  Administered 2013-07-09: 10 ug/min via INTRAVENOUS

## 2013-07-09 MED ORDER — ENOXAPARIN SODIUM 100 MG/ML ~~LOC~~ SOLN
85.0000 mg | Freq: Two times a day (BID) | SUBCUTANEOUS | Status: DC
  Filled 2013-07-09 (×2): qty 1

## 2013-07-09 MED ORDER — SODIUM CHLORIDE 0.9 % IV SOLN: 250.0000 mL | INTRAVENOUS | Status: DC | PRN

## 2013-07-09 MED ORDER — ASPIRIN 81 MG PO CHEW
162.0000 mg | CHEWABLE_TABLET | Freq: Every day | ORAL | Status: DC
  Administered 2013-07-09 – 2013-07-10 (×2): 162 mg via ORAL
  Filled 2013-07-09 (×2): qty 2

## 2013-07-09 MED ORDER — SODIUM CHLORIDE 0.9 % IJ SOLN
3.0000 mL | Freq: Two times a day (BID) | INTRAMUSCULAR | Status: DC
  Administered 2013-07-09: 3 mL via INTRAVENOUS

## 2013-07-09 MED ORDER — SODIUM CHLORIDE 0.9 % IJ SOLN: 3.0000 mL | INTRAMUSCULAR | Status: DC | PRN

## 2013-07-09 MED ORDER — LOSARTAN POTASSIUM 50 MG PO TABS
50.0000 mg | ORAL_TABLET | Freq: Every day | ORAL | Status: DC
  Administered 2013-07-09 – 2013-07-10 (×2): 50 mg via ORAL
  Filled 2013-07-09 (×2): qty 1

## 2013-07-09 MED ORDER — PANTOPRAZOLE SODIUM 40 MG PO TBEC
40.0000 mg | DELAYED_RELEASE_TABLET | Freq: Every day | ORAL | Status: DC
  Administered 2013-07-09 – 2013-07-10 (×2): 40 mg via ORAL
  Filled 2013-07-09 (×2): qty 1

## 2013-07-09 MED ORDER — ENOXAPARIN SODIUM 100 MG/ML ~~LOC~~ SOLN
85.0000 mg | SUBCUTANEOUS | Status: AC
  Administered 2013-07-09: 85 mg via SUBCUTANEOUS
  Filled 2013-07-09: qty 1

## 2013-07-09 MED ORDER — MORPHINE SULFATE 2 MG/ML IJ SOLN: 2.0000 mg | INTRAMUSCULAR | Status: DC | PRN

## 2013-07-09 MED ORDER — ATORVASTATIN CALCIUM 40 MG PO TABS
40.0000 mg | ORAL_TABLET | Freq: Every day | ORAL | Status: DC
  Administered 2013-07-09 – 2013-07-10 (×2): 40 mg via ORAL
  Filled 2013-07-09 (×2): qty 1

## 2013-07-09 MED ORDER — ENOXAPARIN SODIUM 40 MG/0.4ML ~~LOC~~ SOLN
40.0000 mg | SUBCUTANEOUS | Status: DC
  Administered 2013-07-09 – 2013-07-10 (×2): 40 mg via SUBCUTANEOUS
  Filled 2013-07-09 (×2): qty 0.4

## 2013-07-09 NOTE — ED Notes (Signed)
Attempted to call report

## 2013-07-09 NOTE — Progress Notes (Signed)
ANTICOAGULATION CONSULT NOTE - Initial Consult  Pharmacy Consult for Lovenox Indication: chest pain/ACS  Allergies  Allergen Reactions  . Lisinopril Rash    Patient Measurements: Height: 5\' 8"  (172.7 cm) Weight: 190 lb 11.2 oz (86.5 kg) IBW/kg (Calculated) : 68.4  Vital Signs: Temp: 97.6 F (36.4 C) (05/12 0200) Temp src: Oral (05/12 0200) BP: 117/76 mmHg (05/12 0200) Pulse Rate: 60 (05/12 0200)  Labs:  Recent Labs  07/08/13 1818 07/08/13 2123  HGB 12.3*  --   HCT 36.9*  --   PLT 219  --   CREATININE 1.11  --   TROPONINI  --  <0.30    Estimated Creatinine Clearance: 77.6 ml/min (by C-G formula based on Cr of 1.11).   Medical History: Past Medical History  Diagnosis Date  . CAD (coronary artery disease)     BMS to LAD (2011)  . Acute anterior wall MI 10/2009  . Hypertension   . Hyperlipidemia     Medications:  Prescriptions prior to admission  Medication Sig Dispense Refill  . aspirin 81 MG tablet Take 162 mg by mouth daily.       Marland Kitchen atorvastatin (LIPITOR) 40 MG tablet Take 1 tablet (40 mg total) by mouth daily.  30 tablet  6  . cetirizine (ZYRTEC) 10 MG tablet Take 10 mg by mouth daily.      . diphenhydramine-acetaminophen (TYLENOL PM) 25-500 MG TABS Take 1 tablet by mouth at bedtime as needed (for sleep/pain).      Marland Kitchen losartan (COZAAR) 50 MG tablet Take 1 tablet (50 mg total) by mouth daily.  30 tablet  11  . metoprolol (LOPRESSOR) 50 MG tablet Take 50 mg by mouth at bedtime.       . nitroGLYCERIN (NITROSTAT) 0.4 MG SL tablet Place 1 tablet (0.4 mg total) under the tongue every 5 (five) minutes as needed for chest pain.  25 tablet  3  . pantoprazole (PROTONIX) 40 MG tablet Take 1 tablet (40 mg total) by mouth daily.  30 tablet  5  . Polyvinyl Alcohol-Povidone (REFRESH OP) Apply 1 drop to eye as needed (for dry eyes).        Scheduled:  . aspirin  162 mg Oral Daily  . atorvastatin  40 mg Oral Daily  . enoxaparin (LOVENOX) injection  85 mg Subcutaneous NOW   . enoxaparin (LOVENOX) injection  85 mg Subcutaneous Q12H  . losartan  50 mg Oral Daily  . metoprolol  50 mg Oral QHS  . pantoprazole  40 mg Oral Daily  . sodium chloride  3 mL Intravenous Q12H   Infusions:  . nitroGLYCERIN      Assessment: 58yo male c/o intermittent CP radiating to back associated w/ nausea, initial troponin negative, to begin LMWH.  Goal of Therapy:  Anti-Xa level 0.6-1 units/ml 4hrs after LMWH dose given Monitor platelets by anticoagulation protocol: Yes   Plan:  Will start Lovenox 85mg  SQ Q12H and monitor CBC.  Wynona Neat, PharmD, BCPS  07/09/2013,2:31 AM

## 2013-07-09 NOTE — Progress Notes (Signed)
  Echocardiogram 2D Echocardiogram has been performed.  Michael Terrell 07/09/2013, 2:32 PM

## 2013-07-09 NOTE — Progress Notes (Signed)
UR completed. Patient changed to inpatient- requiring NTG gtt

## 2013-07-09 NOTE — Progress Notes (Signed)
[facility] Progress Note   Dareld Mcauliffe Mangino VHQ:[id number] DOB: 16-Jan-1956 DOA: 07/08/2013 PCP: Wyatt Haste, MD  Brief narrative: Michael Terrell is a 58 y.o. male presenting on 07/08/2013 with  has a past medical history of CAD (coronary artery disease); Acute anterior wall MI (10/2009); Hypertension; and Hyperlipidemia who presents with on and off chest pain for past few days.    Subjective: States his chest pain has improved with th Nitro infusion. No other complaints.   Assessment/Plan: Principal Problem:   Unstable angina--CAD  - cont Nitro - cardiac enzymes negative - awaiting cardio eval  Active Problems:      Dyslipidemia - cont statin    HTN  -  Cont home meds    Code Status: Full code Family Communication: none Disposition Plan: await cardio eval  Consultants: Cardio consult  Procedures: none  Antibiotics: Antibiotics Given (last 72 hours)   None       DVT prophylaxis: Lovenox  Objective: Filed Weights   07/09/13 0200 07/09/13 0445  Weight: 86.5 kg (190 lb 11.2 oz) 86.5 kg (190 lb 11.2 oz)   Blood pressure 119/70, pulse 58, temperature 97.9 F (36.6 C), temperature source Oral, resp. rate 11, height 5\' 8"  (1.727 m), weight 86.5 kg (190 lb 11.2 oz), SpO2 98.00%.  Intake/Output Summary (Last 24 hours) at 07/09/13 1022 Last data filed at 07/09/13 0600  Gross per 24 hour  Intake      9 ml  Output      0 ml  Net      9 ml     Exam: General: No acute respiratory distress Lungs: Clear to auscultation bilaterally without wheezes or crackles Cardiovascular: Regular rate and rhythm without murmur gallop or rub normal S1 and S2 Abdomen: Nontender, nondistended, soft, bowel sounds positive, no rebound, no ascites, no appreciable mass Extremities: No significant cyanosis, clubbing, or edema bilateral lower extremities  Data Reviewed: Basic Metabolic Panel:  Recent Labs Lab 07/08/13 1818 07/09/13 0330  NA 138 138  K 4.1  4.8  CL 101 103  CO2 24 23  GLUCOSE 124* 95  BUN 15 14  CREATININE 1.11 1.16  CALCIUM 9.0 8.7   Liver Function Tests: No results found for this basename: AST, ALT, ALKPHOS, BILITOT, PROT, ALBUMIN,  in the last 168 hours No results found for this basename: LIPASE, AMYLASE,  in the last 168 hours No results found for this basename: AMMONIA,  in the last 168 hours CBC:  Recent Labs Lab 07/08/13 1818 07/09/13 0330  WBC 4.5 4.4  HGB 12.3* 11.3*  HCT 36.9* 35.2*  MCV 85.2 87.3  PLT 219 189   Cardiac Enzymes:  Recent Labs Lab 07/08/13 2123 07/09/13 0330  TROPONINI <0.30 <0.30   BNP (last 3 results) No results found for this basename: PROBNP,  in the last 8760 hours CBG: No results found for this basename: GLUCAP,  in the last 168 hours  Recent Results (from the past 240 hour(s))  MRSA PCR SCREENING     Status: None   Collection Time    07/09/13  2:03 AM      Result Value Ref Range Status   MRSA by PCR NEGATIVE  NEGATIVE Final   Comment:            The GeneXpert MRSA Assay (FDA     approved for NASAL specimens     only), is one component of a     comprehensive MRSA colonization     surveillance program. It is  not     intended to diagnose MRSA     infection nor to guide or     monitor treatment for     MRSA infections.     Studies:  Recent x-ray studies have been reviewed in detail by the Attending Physician  Scheduled Meds:  Scheduled Meds: . aspirin  162 mg Oral Daily  . atorvastatin  40 mg Oral Daily  . enoxaparin (LOVENOX) injection  85 mg Subcutaneous Q12H  . losartan  50 mg Oral Daily  . metoprolol  50 mg Oral QHS  . pantoprazole  40 mg Oral Daily  . sodium chloride  3 mL Intravenous Q12H   Continuous Infusions: . nitroGLYCERIN 10 mcg/min (07/09/13 0309)    Time spent on care of this patient: 35 min   Debbe Odea, MD 07/09/2013, 10:22 AM  LOS: 1 day   [facility] Office  [phone] Pager - Text Page per Shea Evans   If 7PM-7AM,  please contact night-coverage [email or web address]

## 2013-07-09 NOTE — Consult Note (Signed)
Primary cardiologist: Hilty  HPI: 58 year old male with past medical history of coronary artery disease for evaluation of chest pain. Patient is status post PCI of his LAD in the setting of an anterior infarct in 2011. Repeat catheterization in 2011 showed normal LV function, 50% right coronary artery, 50% posterior lateral and patent stent in the LAD. He has been treated medically. Echocardiogram in January of 2014 showed normal LV function, grade 1 diastolic dysfunction, mild left atrial enlargement and mild mitral regurgitation. Nuclear study in January of 2012 showed an ejection fraction of 55%, diaphragmatic attenuation, no ischemia. Patient has had occasional chest pain since his infarct. However he typically denies dyspnea on exertion, orthopnea, PND, pedal edema, palpitations, syncope or exertional chest pain. Over the past 3 days he has had intermittent chest pain. It is in the left chest area and described as a sharp pain and as a pressure. It does not radiate. It is not pleuritic, positional, related to food or exertional. It lasts approximately 20 seconds and resolve spontaneously. Cardiology asked to evaluate  Medications Prior to Admission  Medication Sig Dispense Refill  . aspirin 81 MG tablet Take 162 mg by mouth daily.       Marland Kitchen atorvastatin (LIPITOR) 40 MG tablet Take 1 tablet (40 mg total) by mouth daily.  30 tablet  6  . cetirizine (ZYRTEC) 10 MG tablet Take 10 mg by mouth daily.      . diphenhydramine-acetaminophen (TYLENOL PM) 25-500 MG TABS Take 1 tablet by mouth at bedtime as needed (for sleep/pain).      Marland Kitchen losartan (COZAAR) 50 MG tablet Take 1 tablet (50 mg total) by mouth daily.  30 tablet  11  . metoprolol (LOPRESSOR) 50 MG tablet Take 50 mg by mouth at bedtime.       . nitroGLYCERIN (NITROSTAT) 0.4 MG SL tablet Place 1 tablet (0.4 mg total) under the tongue every 5 (five) minutes as needed for chest pain.  25 tablet  3  . pantoprazole (PROTONIX) 40 MG tablet Take 1 tablet  (40 mg total) by mouth daily.  30 tablet  5  . Polyvinyl Alcohol-Povidone (REFRESH OP) Apply 1 drop to eye as needed (for dry eyes).         Allergies  Allergen Reactions  . Lisinopril Rash    Past Medical History  Diagnosis Date  . CAD (coronary artery disease)     BMS to LAD (2011)  . Acute anterior wall MI 10/2009  . Hypertension   . Hyperlipidemia     Past Surgical History  Procedure Laterality Date  . Transthoracic echocardiogram  02/2012    EF 91-63%, grade 1 diastolic dysfunction; mild MR; LA mildly dilated; upper limit borderline LA enlargement  . Nm myocar perf wall motion  02/2010    bruce myoview - normal pattern of perfusion, low risk, no ischemia demonstrated, low risk  . Coronary angioplasty with stent placement  11/13/2009    3.5x36m [facility] BMS to LAD (Dr. TCorky Downs  . Cardiac catheterization  01/29/2010    patent LAD stent (3.5x235mVision - Dr. T.Corky Downs/07/2009); 50% prox PLA stenosis, 50% distal RCA stenosis (Dr. H.Norlene Duel . Knee surgery    . Shoulder surgery      History   Social History  . Marital Status: Married    Spouse Name: N/A    Number of Children: 2  . Years of Education: N/A   Occupational History  . FuNurse, mental healthor [employer]  Social History Main Topics  . Smoking status: Never Smoker   . Smokeless tobacco: Never Used  . Alcohol Use: No  . Drug Use: No  . Sexual Activity: Not on file   Other Topics Concern  . Not on file   Social History Narrative  . No narrative on file    Family History  Problem Relation Age of Onset  . CAD Father     MI in his 33s    ROS:  no fevers or chills, productive cough, hemoptysis, dysphasia, odynophagia, melena, hematochezia, dysuria, hematuria, rash, seizure activity, orthopnea, PND, pedal edema, claudication. Remaining systems are negative.  Physical Exam:   Blood pressure 125/88, pulse 65, temperature 97.7 F (36.5 C), temperature source Oral, resp. rate 18, height 5' 8"   (1.727 m), weight 190 lb 11.2 oz (86.5 kg), SpO2 99.00%.  General:  Well developed/well nourished in NAD Skin warm/dry Patient not depressed No peripheral clubbing Back-normal HEENT-normal/normal eyelids Neck supple/normal carotid upstroke bilaterally; no bruits; no JVD; no thyromegaly chest - CTA/ normal expansion CV - RRR/normal S1 and S2; no murmurs, rubs; Positive S4;  PMI nondisplaced Abdomen -NT/ND, no HSM, no mass, + bowel sounds, no bruit 2+ femoral pulses, no bruits Ext-no edema, chords, 2+ DP Neuro-grossly nonfocal  ECG Normal sinus rhythm, left ventricular hypertrophy, nonspecific ST changes.  Results for orders placed during the hospital encounter of 07/08/13 (from the past 48 hour(s))  CBC     Status: Abnormal   Collection Time    07/08/13  6:18 PM      Result Value Ref Range   WBC 4.5  4.0 - 10.5 K/uL   RBC 4.33  4.22 - 5.81 MIL/uL   Hemoglobin 12.3 (*) 13.0 - 17.0 g/dL   HCT 36.9 (*) 39.0 - 52.0 %   MCV 85.2  78.0 - 100.0 fL   MCH 28.4  26.0 - 34.0 pg   MCHC 33.3  30.0 - 36.0 g/dL   RDW 13.7  11.5 - 15.5 %   Platelets 219  150 - 400 K/uL  BASIC METABOLIC PANEL     Status: Abnormal   Collection Time    07/08/13  6:18 PM      Result Value Ref Range   Sodium 138  137 - 147 mEq/L   Potassium 4.1  3.7 - 5.3 mEq/L   Chloride 101  96 - 112 mEq/L   CO2 24  19 - 32 mEq/L   Glucose, Bld 124 (*) 70 - 99 mg/dL   BUN 15  6 - 23 mg/dL   Creatinine, Ser 1.11  0.50 - 1.35 mg/dL   Calcium 9.0  8.4 - 10.5 mg/dL   GFR calc non Af Amer 71 (*) >90 mL/min   GFR calc Af Amer 83 (*) >90 mL/min   Comment: (NOTE)     The eGFR has been calculated using the CKD EPI equation.     This calculation has not been validated in all clinical situations.     eGFR's persistently <90 mL/min signify possible Chronic Kidney     Disease.  Randolm Idol, ED     Status: None   Collection Time    07/08/13  6:31 PM      Result Value Ref Range   Troponin i, poc 0.00  0.00 - 0.08 ng/mL    Comment 3            Comment: Due to the release kinetics of cTnI,     a negative result within the  first hours     of the onset of symptoms does not rule out     myocardial infarction with certainty.     If myocardial infarction is still suspected,     repeat the test at appropriate intervals.  TROPONIN I     Status: None   Collection Time    07/08/13  9:23 PM      Result Value Ref Range   Troponin I <0.30  <0.30 ng/mL   Comment:            Due to the release kinetics of cTnI,     a negative result within the first hours     of the onset of symptoms does not rule out     myocardial infarction with certainty.     If myocardial infarction is still suspected,     repeat the test at appropriate intervals.  MRSA PCR SCREENING     Status: None   Collection Time    07/09/13  2:03 AM      Result Value Ref Range   MRSA by PCR NEGATIVE  NEGATIVE   Comment:            The GeneXpert MRSA Assay (FDA     approved for NASAL specimens     only), is one component of a     comprehensive MRSA colonization     surveillance program. It is not     intended to diagnose MRSA     infection nor to guide or     monitor treatment for     MRSA infections.  TROPONIN I     Status: None   Collection Time    07/09/13  3:30 AM      Result Value Ref Range   Troponin I <0.30  <0.30 ng/mL   Comment:            Due to the release kinetics of cTnI,     a negative result within the first hours     of the onset of symptoms does not rule out     myocardial infarction with certainty.     If myocardial infarction is still suspected,     repeat the test at appropriate intervals.  PROTIME-INR     Status: None   Collection Time    07/09/13  3:30 AM      Result Value Ref Range   Prothrombin Time 13.8  11.6 - 15.2 seconds   INR 1.08  0.00 - 1.49  APTT     Status: None   Collection Time    07/09/13  3:30 AM      Result Value Ref Range   aPTT 29  24 - 37 seconds  CBC     Status: Abnormal   Collection Time     07/09/13  3:30 AM      Result Value Ref Range   WBC 4.4  4.0 - 10.5 K/uL   RBC 4.03 (*) 4.22 - 5.81 MIL/uL   Hemoglobin 11.3 (*) 13.0 - 17.0 g/dL   HCT 35.2 (*) 39.0 - 52.0 %   MCV 87.3  78.0 - 100.0 fL   MCH 28.0  26.0 - 34.0 pg   MCHC 32.1  30.0 - 36.0 g/dL   RDW 14.0  11.5 - 15.5 %   Platelets 189  150 - 400 K/uL  BASIC METABOLIC PANEL     Status: Abnormal   Collection Time    07/09/13  3:30 AM  Result Value Ref Range   Sodium 138  137 - 147 mEq/L   Potassium 4.8  3.7 - 5.3 mEq/L   Chloride 103  96 - 112 mEq/L   CO2 23  19 - 32 mEq/L   Glucose, Bld 95  70 - 99 mg/dL   BUN 14  6 - 23 mg/dL   Creatinine, Ser 1.16  0.50 - 1.35 mg/dL   Calcium 8.7  8.4 - 10.5 mg/dL   GFR calc non Af Amer 68 (*) >90 mL/min   GFR calc Af Amer 78 (*) >90 mL/min   Comment: (NOTE)     The eGFR has been calculated using the CKD EPI equation.     This calculation has not been validated in all clinical situations.     eGFR's persistently <90 mL/min signify possible Chronic Kidney     Disease.  LIPID PANEL     Status: Abnormal   Collection Time    07/09/13  3:30 AM      Result Value Ref Range   Cholesterol 146  0 - 200 mg/dL   Triglycerides 196 (*) <150 mg/dL   HDL 29 (*) >39 mg/dL   Total CHOL/HDL Ratio 5.0     VLDL 39  0 - 40 mg/dL   LDL Cholesterol 78  0 - 99 mg/dL   Comment:            Total Cholesterol/HDL:CHD Risk     Coronary Heart Disease Risk Table                         Men   Women      1/2 Average Risk   3.4   3.3      Average Risk       5.0   4.4      2 X Average Risk   9.6   7.1      3 X Average Risk  23.4   11.0                Use the calculated Patient Ratio     above and the CHD Risk Table     to determine the patient's CHD Risk.                ATP III CLASSIFICATION (LDL):      <100     mg/dL   Optimal      100-129  mg/dL   Near or Above                        Optimal      130-159  mg/dL   Borderline      160-189  mg/dL   High      >190     mg/dL   Very High    TROPONIN I     Status: None   Collection Time    07/09/13  8:40 AM      Result Value Ref Range   Troponin I <0.30  <0.30 ng/mL   Comment:            Due to the release kinetics of cTnI,     a negative result within the first hours     of the onset of symptoms does not rule out     myocardial infarction with certainty.     If myocardial infarction is still suspected,     repeat the test at appropriate intervals.  Dg Chest 2 View  07/08/2013   CLINICAL DATA:  Two day history of chest pain and shortness of breath; history of hypertension and previous MI with stent placement  EXAM: CHEST  2 VIEW  COMPARISON:  DG CHEST 1V PORT dated 01/27/2010  FINDINGS: The lungs are borderline hypoinflated but clear. The cardiac silhouette is normal in size. The pulmonary vascularity is not engorged. The mediastinum is normal in width. There is no pleural effusion. There is mild tortuosity of the descending thoracic aorta. The observed portions of the bony thorax exhibit no acute abnormalities.  IMPRESSION: There is borderline hypoinflation. There is no evidence of active cardiopulmonary disease.   Electronically Signed   By: David  Martinique   On: 07/08/2013 19:50    Assessment/Plan 1 chest pain-symptoms are very atypical. Electrocardiogram not diagnostic. Enzymes negative. We'll arrange nuclear study tomorrow morning 2 coronary artery disease-continue aspirin and statin. 3 hyperlipidemia-continue statin. 4 hypertension-continue present medications. 5 bradycardia-patient had transient 2-1 AV block at 7 AM. Decrease Toprol to 25 mg daily.  Kirk Ruths MD 07/09/2013, 12:42 PM

## 2013-07-10 ENCOUNTER — Inpatient Hospital Stay (HOSPITAL_COMMUNITY): Payer: BC Managed Care – PPO

## 2013-07-10 ENCOUNTER — Other Ambulatory Visit: Payer: Self-pay | Admitting: Cardiology

## 2013-07-10 DIAGNOSIS — R079 Chest pain, unspecified: Secondary | ICD-10-CM

## 2013-07-10 DIAGNOSIS — I455 Other specified heart block: Secondary | ICD-10-CM

## 2013-07-10 DIAGNOSIS — I441 Atrioventricular block, second degree: Secondary | ICD-10-CM

## 2013-07-10 LAB — CBC
HCT: 36 % — ABNORMAL LOW (ref 39.0–52.0)
Hemoglobin: 11.6 g/dL — ABNORMAL LOW (ref 13.0–17.0)
MCH: 27.6 pg (ref 26.0–34.0)
MCHC: 32.2 g/dL (ref 30.0–36.0)
MCV: 85.7 fL (ref 78.0–100.0)
Platelets: 224 10*3/uL (ref 150–400)
RBC: 4.2 MIL/uL — ABNORMAL LOW (ref 4.22–5.81)
RDW: 13.8 % (ref 11.5–15.5)
WBC: 5.2 10*3/uL (ref 4.0–10.5)

## 2013-07-10 MED ORDER — TECHNETIUM TC 99M SESTAMIBI GENERIC - CARDIOLITE
30.0000 | Freq: Once | INTRAVENOUS | Status: AC | PRN
  Administered 2013-07-10: 30 via INTRAVENOUS

## 2013-07-10 MED ORDER — ACETAMINOPHEN 325 MG PO TABS
ORAL_TABLET | ORAL | Status: AC
  Filled 2013-07-10: qty 2

## 2013-07-10 MED ORDER — TECHNETIUM TC 99M SESTAMIBI GENERIC - CARDIOLITE
10.0000 | Freq: Once | INTRAVENOUS | Status: AC | PRN
  Administered 2013-07-10: 10 via INTRAVENOUS

## 2013-07-10 MED ORDER — REGADENOSON 0.4 MG/5ML IV SOLN
0.4000 mg | Freq: Once | INTRAVENOUS | Status: AC
  Administered 2013-07-10: 0.4 mg via INTRAVENOUS
  Filled 2013-07-10: qty 5

## 2013-07-10 MED ORDER — ACETAMINOPHEN 325 MG PO TABS
650.0000 mg | ORAL_TABLET | Freq: Once | ORAL | Status: AC
  Administered 2013-07-10: 650 mg via ORAL

## 2013-07-10 NOTE — Discharge Summary (Addendum)
Physician Discharge Summary  Michael Terrell PYK:[id number] DOB: August 15, 1955 DOA: 07/08/2013  PCP: Wyatt Haste, MD  Admit date: 07/08/2013 Discharge date: 07/10/2013  Time spent: 35 minutes  Recommendations for Outpatient Follow-up:  1. Follow up with PCP in 1-2 weeks 2. Follow up with Dr. Debara Pickett as scheduled 3. Dr. Debara Pickett to arrange a 2 week event monitor after discharge  Discharge Diagnoses:  Principal Problem:   Unstable angina Active Problems:   CAD (coronary artery disease)   Dyslipidemia   HTN (hypertension) Bradycardia/Sinus pause  Discharge Condition: Stable  Diet recommendation: Cardiac  Filed Weights   07/09/13 0200 07/09/13 0445 07/10/13 0448  Weight: 86.5 kg (190 lb 11.2 oz) 86.5 kg (190 lb 11.2 oz) 86.6 kg (190 lb 14.7 oz)    History of present illness:  58 yo male h/o CAD with LAD stent in 2011, htn, hld comes in with one day of waxing/waning sscp that lasts only seconds at a time relieved with nothing, worse by nothing, nonexertional without radiation. No n/v no sob. No le swelling or edema. He has experienced anginal symptoms a couple times a year which is normally relieved with ntg sl. However, this time it persisted, he did take ntg which made it better. His pain is much better now on ntg gtt. He takes aspirin daily, and got full dosing today at home. No fevers. No coughing. Pain not better or worse with eating either. Asked to admit pt for unstable angina.  Hospital Course:  1. Chest pain/Unstable Angina  1. Pt is s/p stress test this AM - await results 2. Cardiac enzymes neg 3. Cardiology following - discussed results w/ Dr. Debara Pickett 4. 2D echo with EF of 50-55% with no wall motion abnormality noted 5. The patient underwent lexiscan stress on 5/13 with normal results. The patient was subsequently cleared for discharge home with close outpatient follow up. 2. HTN  1. Hold beta blocker (see below) 3. Bradycardia with sinus pause 1. Pause noted on  tele 2. Discussed w/ Dr. Debara Pickett with recs to hold beta blocker for now and plans for 2 week event monitor on d/c 4. DVT prophylaxis  1. lovenox while inpatient  Procedures: Lexiscan stress test 5/13  Consultations:  Cardiology - Dr. Debara Pickett  Discharge Exam: Filed Vitals:   07/10/13 1022 07/10/13 1023 07/10/13 1025 07/10/13 1330  BP: 118/86 135/85 147/81 148/99  Pulse:    98  Temp:    98.1 F (36.7 C)  TempSrc:    Oral  Resp:    18  Height:      Weight:      SpO2:    98%    General: Awake, in nad Cardiovascular: Regular, s1, s2 Respiratory: normal resp effort, no wheezing  Discharge Instructions     Medication List    STOP taking these medications       metoprolol 50 MG tablet  Commonly known as:  LOPRESSOR      TAKE these medications       aspirin 81 MG tablet  Take 162 mg by mouth daily.     atorvastatin 40 MG tablet  Commonly known as:  LIPITOR  Take 1 tablet (40 mg total) by mouth daily.     cetirizine 10 MG tablet  Commonly known as:  ZYRTEC  Take 10 mg by mouth daily.     diphenhydramine-acetaminophen 25-500 MG Tabs  Commonly known as:  TYLENOL PM  Take 1 tablet by mouth at bedtime as needed (for sleep/pain).  losartan 50 MG tablet  Commonly known as:  COZAAR  Take 1 tablet (50 mg total) by mouth daily.     nitroGLYCERIN 0.4 MG SL tablet  Commonly known as:  NITROSTAT  Place 1 tablet (0.4 mg total) under the tongue every 5 (five) minutes as needed for chest pain.     pantoprazole 40 MG tablet  Commonly known as:  PROTONIX  Take 1 tablet (40 mg total) by mouth daily.     REFRESH OP  Apply 1 drop to eye as needed (for dry eyes).       Allergies  Allergen Reactions  . Lisinopril Rash   Follow-up Information   Follow up with Wyatt Haste, MD. Schedule an appointment as soon as possible for a visit in 1 week.   Specialty:  Family Medicine   Contact information:   [address] [place] [address] [phone]        Follow up with Pixie Casino, MD. (office will call you about monitor and follow up)    Specialty:  Cardiology   Contact information:   [place] [address] [phone]        The results of significant diagnostics from this hospitalization (including imaging, microbiology, ancillary and laboratory) are listed below for reference.    Significant Diagnostic Studies: Dg Chest 2 View  07/08/2013   CLINICAL DATA:  Two day history of chest pain and shortness of breath; history of hypertension and previous MI with stent placement  EXAM: CHEST  2 VIEW  COMPARISON:  DG CHEST 1V PORT dated 01/27/2010  FINDINGS: The lungs are borderline hypoinflated but clear. The cardiac silhouette is normal in size. The pulmonary vascularity is not engorged. The mediastinum is normal in width. There is no pleural effusion. There is mild tortuosity of the descending thoracic aorta. The observed portions of the bony thorax exhibit no acute abnormalities.  IMPRESSION: There is borderline hypoinflation. There is no evidence of active cardiopulmonary disease.   Electronically Signed   By: David  Martinique   On: 07/08/2013 19:50    Microbiology: Recent Results (from the past 240 hour(s))  MRSA PCR SCREENING     Status: None   Collection Time    07/09/13  2:03 AM      Result Value Ref Range Status   MRSA by PCR NEGATIVE  NEGATIVE Final   Comment:            The GeneXpert MRSA Assay (FDA     approved for NASAL specimens     only), is one component of a     comprehensive MRSA colonization     surveillance program. It is not     intended to diagnose MRSA     infection nor to guide or     monitor treatment for     MRSA infections.     Labs: Basic Metabolic Panel:  Recent Labs Lab 07/08/13 1818 07/09/13 0330  NA 138 138  K 4.1 4.8  CL 101 103  CO2 24 23  GLUCOSE 124* 95  BUN 15 14  CREATININE 1.11 1.16  CALCIUM 9.0 8.7   Liver Function Tests: No results found for  this basename: AST, ALT, ALKPHOS, BILITOT, PROT, ALBUMIN,  in the last 168 hours No results found for this basename: LIPASE, AMYLASE,  in the last 168 hours No results found for this basename: AMMONIA,  in the last 168 hours CBC:  Recent Labs Lab 07/08/13 1818 07/09/13 0330 07/10/13 0200  WBC 4.5 4.4 5.2  HGB 12.3* 11.3* 11.6*  HCT 36.9* 35.2* 36.0*  MCV 85.2 87.3 85.7  PLT 219 189 224   Cardiac Enzymes:  Recent Labs Lab 07/08/13 2123 07/09/13 0330 07/09/13 0840 07/09/13 1410  TROPONINI <0.30 <0.30 <0.30 <0.30   BNP: BNP (last 3 results) No results found for this basename: PROBNP,  in the last 8760 hours CBG: No results found for this basename: GLUCAP,  in the last 168 hours     Signed:  Donne Hazel  [facility] 07/10/2013, 3:41 PM

## 2013-07-10 NOTE — Progress Notes (Addendum)
Patient seen and examined. Agree with the NP/PA-C note as written.  Echo shows unchanged EF of 50-55%. No new WMA's. Plan for NST today. Chest pain was atypical. If non-ischemic, could be discharged home and follow-up with me in the office. He reportedly had 2:1 AV Block - looks like sinus pause this am. Will d/c b-blocker. I would arrange for a 2 week event monitor prior to seeing me in the office in follow-up.  Pixie Casino, MD, [facility] Attending Cardiologist [place]

## 2013-07-10 NOTE — Progress Notes (Signed)
    Subjective:  No chest pain  Objective:  Vital Signs in the last 24 hours: Temp:  [97.6 F (36.4 C)-98.2 F (36.8 C)] 97.7 F (36.5 C) (05/13 0800) Pulse Rate:  [65-74] 73 (05/13 0955) Resp:  [14-21] 16 (05/13 0800) BP: (118-156)/(85-98) 135/85 mmHg (05/13 1023) SpO2:  [91 %-100 %] 96 % (05/13 0800) Weight:  [190 lb 14.7 oz (86.6 kg)] 190 lb 14.7 oz (86.6 kg) (05/13 0448)  Intake/Output from previous day:  Intake/Output Summary (Last 24 hours) at 07/10/13 1024 Last data filed at 07/09/13 2223  Gross per 24 hour  Intake    243 ml  Output   1225 ml  Net   -982 ml    Physical Exam: General appearance: alert, cooperative and no distress Lungs: clear to auscultation bilaterally Heart: regular rate and rhythm   Rate: 80Rhythm: normal sinus rhythm  Lab Results:  Recent Labs  07/09/13 0330 07/10/13 0200  WBC 4.4 5.2  HGB 11.3* 11.6*  PLT 189 224    Recent Labs  07/08/13 1818 07/09/13 0330  NA 138 138  K 4.1 4.8  CL 101 103  CO2 24 23  GLUCOSE 124* 95  BUN 15 14  CREATININE 1.11 1.16    Recent Labs  07/09/13 0840 07/09/13 1410  TROPONINI <0.30 <0.30    Recent Labs  07/09/13 0330  INR 1.08    Imaging: Imaging results have been reviewed  Cardiac Studies:  Assessment/Plan:   Principal Problem:   Unstable angina Active Problems:   CAD (coronary artery disease)   HTN (hypertension)   Dyslipidemia    PLAN:  Myoview today (changed to Lexiscan in radiology because pt is reported as a"moderate fall risk"  Kerin Ransom PA-C [facility] [phone] 07/10/2013, 10:24 AM

## 2013-07-13 NOTE — ED Provider Notes (Signed)
Medical screening examination/treatment/procedure(s) were conducted as a shared visit with non-physician practitioner(s) or resident  and myself.  I personally evaluated the patient during the encounter and agree with the findings and plan unless otherwise indicated.    I have personally reviewed any xrays and/ or EKG's with the provider and I agree with interpretation.   Patient with known cardiac history and stenting presents with intermittent chest pressure initially lasting seconds but now more prolonged relief with nitroglycerin. No specific exertional symptoms. Possibly similar to previous cardiac history per patient. On exam lungs clear, no distress, normal sinus rhythm, no diaphoresis, no focal leg pain, moist mucous membranes.  Patient requiring nitroglycerin glycerin for pain control. Nitro drip started in ED with improvement in symptoms. EKG reviewed no ST elevation. First troponin negative. Resident spoke with medicine for stepped-down admission and cardiology consulted. On recheck symptoms improved. CRITICAL CARE Performed by: Mariea Clonts   Total critical care time: 30 min  Critical care time was exclusive of separately billable procedures and treating other patients.  Critical care was necessary to treat or prevent imminent or life-threatening deterioration.  Critical care was time spent personally by me on the following activities: development of treatment plan with patient and/or surrogate as well as nursing, discussions with consultants, evaluation of patient's response to treatment, examination of patient, obtaining history from patient or surrogate, ordering and performing treatments and interventions, ordering and review of laboratory studies, ordering and review of radiographic studies, pulse oximetry and re-evaluation of patient's condition.  Unstable angina, chest pain, CAD  Mariea Clonts, MD 07/13/13 [phone]

## 2013-07-18 ENCOUNTER — Telehealth: Payer: Self-pay | Admitting: Internal Medicine

## 2013-07-18 NOTE — Telephone Encounter (Signed)
Closed encounter °

## 2013-07-19 ENCOUNTER — Ambulatory Visit: Payer: BC Managed Care – PPO

## 2013-07-19 ENCOUNTER — Telehealth: Payer: Self-pay | Admitting: Cardiology

## 2013-07-19 DIAGNOSIS — R079 Chest pain, unspecified: Secondary | ICD-10-CM

## 2013-07-19 DIAGNOSIS — I441 Atrioventricular block, second degree: Secondary | ICD-10-CM

## 2013-07-19 NOTE — Progress Notes (Signed)
Monitor placed on patient in office for bradycardia and chest pain per Kerin Ransom, PA-C.

## 2013-07-19 NOTE — Telephone Encounter (Signed)
Pt called saying [employer] had no order for his monitor-(placed at the office today). I called the office and they will arrange for an order to be sent and [employer] will contact the patient and let him know when he can turn on the monitor.   Kerin Ransom PA-C 07/19/2013 6:13 PM

## 2013-07-23 ENCOUNTER — Ambulatory Visit: Payer: BC Managed Care – PPO | Admitting: Cardiology

## 2013-07-30 ENCOUNTER — Ambulatory Visit (INDEPENDENT_AMBULATORY_CARE_PROVIDER_SITE_OTHER): Payer: BC Managed Care – PPO | Admitting: Internal Medicine

## 2013-07-30 ENCOUNTER — Encounter: Payer: Self-pay | Admitting: Internal Medicine

## 2013-07-30 VITALS — BP 130/90 | HR 82 | Ht 69.0 in | Wt 195.0 lb

## 2013-07-30 DIAGNOSIS — I498 Other specified cardiac arrhythmias: Secondary | ICD-10-CM

## 2013-07-30 DIAGNOSIS — I2510 Atherosclerotic heart disease of native coronary artery without angina pectoris: Secondary | ICD-10-CM

## 2013-07-30 DIAGNOSIS — I441 Atrioventricular block, second degree: Secondary | ICD-10-CM

## 2013-07-30 DIAGNOSIS — I10 Essential (primary) hypertension: Secondary | ICD-10-CM

## 2013-07-30 DIAGNOSIS — I455 Other specified heart block: Secondary | ICD-10-CM

## 2013-07-30 DIAGNOSIS — E785 Hyperlipidemia, unspecified: Secondary | ICD-10-CM

## 2013-07-30 DIAGNOSIS — R0609 Other forms of dyspnea: Secondary | ICD-10-CM

## 2013-07-30 DIAGNOSIS — G4710 Hypersomnia, unspecified: Secondary | ICD-10-CM

## 2013-07-30 DIAGNOSIS — R001 Bradycardia, unspecified: Secondary | ICD-10-CM

## 2013-07-30 DIAGNOSIS — R079 Chest pain, unspecified: Secondary | ICD-10-CM

## 2013-07-30 DIAGNOSIS — R0683 Snoring: Secondary | ICD-10-CM | POA: Insufficient documentation

## 2013-07-30 DIAGNOSIS — R0989 Other specified symptoms and signs involving the circulatory and respiratory systems: Secondary | ICD-10-CM

## 2013-07-30 HISTORY — DX: Atrioventricular block, second degree: I44.1

## 2013-07-30 NOTE — Progress Notes (Signed)
OFFICE NOTE  Chief Complaint:  Establish new cardiologist  Primary Care Physician: Wyatt Haste, MD  HPI:  Michael Terrell is a pleasant 58 year old male who was present although Dr. Rollene Fare. He is here today to establish care with me. His past medical history significant for an anterior wall MI in 2011. He underwent emergent catheterization and was sent to have an proximal LAD lesion. He had a 3.5 x 23 mm vision bare metal stent placed and postdilated to 3.77 mm. Subsequently had a recently December 2000 1140 chest discomfort which showed a patent stent and there was residual 50% RCA disease and 50% disease in the posterior lateral branch. Since that time he's been undergoing aggressive medical therapy, he continues to exercise and eat a fairly healthy diet. He's had no further chest pain episodes.  He has not required any additional nitroglycerin.  Lipid profile from September 2014 shows total cholesterol of 162, triglycerides 171, HDL 33 and LDL 95.   Michael Terrell was recently hospitalized for chest pain. He was felt to be atypical however he did undergo a nuclear stress test which was negative for ischemia. During his hospitalization he was noted to have marked bradycardia with second degree type II AV block. He is beta blocker was discontinued. After discharge he was placed on a monitor. He wore the monitor from 07/19/2013 until now. A report from [place] indicated that he's had several episodes of sinus bradycardia with a greater than 3 second pause on 07/20/2013. He also had marked sinus bradycardia with heart rates in the 20s on 07/21/2013. Several of the episodes occur at night while he is asleep and he is generally unaware of them.  He reports his chest pain has resolved.  PMHx:  Past Medical History  Diagnosis Date  . CAD (coronary artery disease)     BMS to LAD (2011)  . Acute anterior wall MI 10/2009  . Hypertension   . Hyperlipidemia     Past Surgical  History  Procedure Laterality Date  . Transthoracic echocardiogram  02/2012    EF 60-63%, grade 1 diastolic dysfunction; mild MR; LA mildly dilated; upper limit borderline LA enlargement  . Nm myocar perf wall motion  02/2010    bruce myoview - normal pattern of perfusion, low risk, no ischemia demonstrated, low risk  . Coronary angioplasty with stent placement  11/13/2009    3.5x40mm Vision BMS to LAD (Dr. Corky Downs)  . Cardiac catheterization  01/29/2010    patent LAD stent (3.5x71mm Vision - Dr. Corky Downs 11/03/2009); 50% prox PLA stenosis, 50% distal RCA stenosis (Dr. Norlene Duel)  . Knee surgery    . Shoulder surgery      FAMHx:  Family History  Problem Relation Age of Onset  . CAD Father     MI in his 23s    SOCHx:   reports that he has never smoked. He has never used smokeless tobacco. He reports that he does not drink alcohol or use illicit drugs.  ALLERGIES:  Allergies  Allergen Reactions  . Lisinopril Rash    ROS: A comprehensive review of systems was negative except for: Cardiovascular: positive for chest pressure/discomfort  HOME MEDS: Current Outpatient Prescriptions  Medication Sig Dispense Refill  . atorvastatin (LIPITOR) 40 MG tablet Take 1 tablet (40 mg total) by mouth daily.  30 tablet  6  . cetirizine (ZYRTEC) 10 MG tablet Take 10 mg by mouth daily.      . diphenhydramine-acetaminophen (TYLENOL PM) 25-500 MG  TABS Take 1 tablet by mouth at bedtime as needed (for sleep/pain).      Marland Kitchen losartan (COZAAR) 50 MG tablet Take 1 tablet (50 mg total) by mouth daily.  30 tablet  11  . nitroGLYCERIN (NITROSTAT) 0.4 MG SL tablet Place 1 tablet (0.4 mg total) under the tongue every 5 (five) minutes as needed for chest pain.  25 tablet  3  . pantoprazole (PROTONIX) 40 MG tablet Take 1 tablet (40 mg total) by mouth daily.  30 tablet  5  . Polyvinyl Alcohol-Povidone (REFRESH OP) Apply 1 drop to eye as needed (for dry eyes).       Marland Kitchen aspirin 81 MG tablet Take 162 mg by mouth daily.         No current facility-administered medications for this visit.    LABS/IMAGING: No results found for this or any previous visit (from the past 48 hour(s)). No results found.  VITALS: BP 130/90  Pulse 82  Ht 5\' 9"  (1.753 m)  Wt 195 lb (88.451 kg)  BMI 28.78 kg/m2  EXAM: Deferred  EKG: Normal sinus rhythm at 80, minimal voltage criteria for LVH  ASSESSMENT: 1. History of anterior MI in 2011, status post proximal LAD bare-metal stent (3.5 x 23 mm vision) 2. Dyslipidemia-not at goal 3. Hypertension-controlled 4. Mobitz II block, sinus pauses  PLAN: 1.   [name] had an episode of chest pain since I last saw him and was hospitalized for this. He ruled out for MI and was low risk by nuclear stress testing. His chest pain was atypical. He was however noted to have bradycardia and second degree type II AV block in the hospital. His beta blocker was discontinued and at discharge he was placed on a monitor. This is demonstrated predominantly episodes of sinus bradycardia at night with long pauses greater than 3 seconds. He is generally unaware of these episodes. I suspect that this could be do to apnea as he reports worsening snoring and daytime somnolence with nonrestorative sleep. I would recommend an overnight sleep study. Although he may meet criteria for a pacemaker I not sure that this is clinically indicated, but I would recommend referral to cardiac electrophysiology for evaluation. He has been off of all beta blockers or negative chronotropes for several weeks.  Pixie Casino, MD, [facility] Attending Cardiologist [place] 07/30/2013, 9:54 AM

## 2013-07-30 NOTE — Patient Instructions (Signed)
You have been referred to our [facility] office for an electrophysiology evaluation.   Your physician has recommended that you have a sleep study. This test records several body functions during sleep, including: brain activity, eye movement, oxygen and carbon dioxide blood levels, heart rate and rhythm, breathing rate and rhythm, the flow of air through your mouth and nose, snoring, body muscle movements, and chest and belly movement. ** this is done at [facility] wants you to follow-up in: 6 months. You will receive a reminder letter in the mail two months in advance. If you don't receive a letter, please call our office to schedule the follow-up appointment.

## 2013-08-01 ENCOUNTER — Encounter: Payer: Self-pay | Admitting: Internal Medicine

## 2013-08-01 ENCOUNTER — Ambulatory Visit (INDEPENDENT_AMBULATORY_CARE_PROVIDER_SITE_OTHER): Payer: BC Managed Care – PPO | Admitting: Internal Medicine

## 2013-08-01 VITALS — BP 153/94 | HR 84 | Ht 69.0 in | Wt 194.0 lb

## 2013-08-01 DIAGNOSIS — I455 Other specified heart block: Secondary | ICD-10-CM

## 2013-08-01 NOTE — Assessment & Plan Note (Signed)
The patient appears to be asymptomatic and his old ECG's demonstrated a short PR interval and a normal QRS duration. He has no obvious conduction system disease. As there is a question of snoring, I agree with sleep evaluation which could certainly contribute to his nocturnal bradycardia. I do not think he has an indication for PPM but have recommended watchful waiting.

## 2013-08-01 NOTE — Progress Notes (Signed)
HPI [name] is referred today by Dr. Debara Terrell for evaluation of bradycardia. He is a very pleasant middle aged man with CAD, s/p anterior MI, treated with stenting who has had preserved LV function. The patient has had documented bradycardia on cardiac monitoring, mostly at nightime. He has never had syncope or near syncope. He had a cardiac stress test with Lexiscan (not a treadmill) which was unremarkable. His nocturnal heart rates have been in the 30's. Allergies  Allergen Reactions  . Lisinopril Rash     Current Outpatient Prescriptions  Medication Sig Dispense Refill  . aspirin 81 MG tablet Take 162 mg by mouth daily.       Marland Kitchen atorvastatin (LIPITOR) 40 MG tablet Take 1 tablet (40 mg total) by mouth daily.  30 tablet  6  . cetirizine (ZYRTEC) 10 MG tablet Take 10 mg by mouth daily.      . diphenhydramine-acetaminophen (TYLENOL PM) 25-500 MG TABS Take 1 tablet by mouth at bedtime as needed (for sleep/pain).      Marland Kitchen losartan (COZAAR) 50 MG tablet Take 1 tablet (50 mg total) by mouth daily.  30 tablet  11  . nitroGLYCERIN (NITROSTAT) 0.4 MG SL tablet Place 1 tablet (0.4 mg total) under the tongue every 5 (five) minutes as needed for chest pain.  25 tablet  3  . pantoprazole (PROTONIX) 40 MG tablet Take 1 tablet (40 mg total) by mouth daily.  30 tablet  5  . Polyvinyl Alcohol-Povidone (REFRESH OP) Apply 1 drop to eye as needed (for dry eyes).        No current facility-administered medications for this visit.     Past Medical History  Diagnosis Date  . CAD (coronary artery disease)     BMS to LAD (2011)  . Acute anterior wall MI 10/2009  . Hypertension   . Hyperlipidemia   . Dyslipidemia   . Chest pain   . Sinus pause   . Snoring     ROS:   All systems reviewed and negative except as noted in the HPI.   Past Surgical History  Procedure Laterality Date  . Transthoracic echocardiogram  02/2012    EF 70-62%, grade 1 diastolic dysfunction; mild MR; LA mildly dilated;  upper limit borderline LA enlargement  . Nm myocar perf wall motion  02/2010    bruce myoview - normal pattern of perfusion, low risk, no ischemia demonstrated, low risk  . Coronary angioplasty with stent placement  11/13/2009    3.5x69mm Vision BMS to LAD (Dr. Corky Downs)  . Cardiac catheterization  01/29/2010    patent LAD stent (3.5x70mm Vision - Dr. Corky Downs 11/03/2009); 50% prox PLA stenosis, 50% distal RCA stenosis (Dr. Norlene Duel)  . Knee surgery    . Shoulder surgery       Family History  Problem Relation Age of Onset  . CAD Father     MI in his 63s     History   Social History  . Marital Status: Married    Spouse Name: N/A    Number of Children: 2  . Years of Education: N/A   Occupational History  . Nurse, 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[employer] History Main Topics  . Smoking status: Never Smoker   . Smokeless tobacco: Never Used  . Alcohol Use: No  . Drug Use: No  . Sexual Activity: Not on file   Other Topics Concern  . Not on file   Social History Narrative  .  No narrative on file     BP 153/94  Pulse 84  Ht 5\' 9"  (1.753 m)  Wt 194 lb (87.998 kg)  BMI 28.64 kg/m2  Physical Exam:  Well appearing middle aged man, NAD HEENT: Unremarkable Neck:  No JVD, no thyromegally Back:  No CVA tenderness Lungs:  Clear with no wheezes HEART:  Regular rate rhythm, no murmurs, no rubs, no clicks Abd:  soft, positive bowel sounds, no organomegally, no rebound, no guarding Ext:  2 plus pulses, no edema, no cyanosis, no clubbing Skin:  No rashes no nodules Neuro:  CN II through XII intact, motor grossly intact    Assess/Plan:

## 2013-08-01 NOTE — Patient Instructions (Signed)
Your physician recommends that you schedule a follow-up appointment as needed  

## 2013-09-24 LAB — COMPREHENSIVE METABOLIC PANEL
ALT: 22 U/L (ref 0–53)
AST: 24 U/L (ref 0–37)
Albumin: 4.2 g/dL (ref 3.5–5.2)
Alkaline Phosphatase: 113 U/L (ref 39–117)
BUN: 15 mg/dL (ref 6–23)
CO2: 26 mEq/L (ref 19–32)
Calcium: 9.3 mg/dL (ref 8.4–10.5)
Chloride: 106 mEq/L (ref 96–112)
Creat: 1.17 mg/dL (ref 0.50–1.35)
Glucose, Bld: 94 mg/dL (ref 70–99)
Potassium: 5.3 mEq/L (ref 3.5–5.3)
Sodium: 140 mEq/L (ref 135–145)
Total Bilirubin: 0.5 mg/dL (ref 0.2–1.2)
Total Protein: 6.8 g/dL (ref 6.0–8.3)

## 2013-09-25 LAB — NMR LIPOPROFILE WITH LIPIDS
Cholesterol, Total: 130 mg/dL (ref <200)
HDL Particle Number: 27.9 umol/L — ABNORMAL LOW (ref >=30.5)
HDL Size: 8.2 nm — ABNORMAL LOW (ref >=9.2)
HDL-C: 39 mg/dL — ABNORMAL LOW (ref >=40)
LDL (calc): 70 mg/dL (ref <100)
LDL Particle Number: 1292 nmol/L — ABNORMAL HIGH (ref <1000)
LDL Size: 20 nm — ABNORMAL LOW (ref >20.5)
LP-IR Score: 65 — ABNORMAL HIGH (ref <=45)
Large HDL-P: 1.4 umol/L — ABNORMAL LOW (ref >=4.8)
Large VLDL-P: 2 nmol/L (ref <=2.7)
Small LDL Particle Number: 947 nmol/L — ABNORMAL HIGH (ref <=527)
Triglycerides: 104 mg/dL (ref <150)
VLDL Size: 47.8 nm — ABNORMAL HIGH (ref <=46.6)

## 2013-09-27 ENCOUNTER — Encounter: Payer: Self-pay | Admitting: *Deleted

## 2013-10-01 ENCOUNTER — Telehealth: Payer: Self-pay | Admitting: Internal Medicine

## 2013-10-01 NOTE — Telephone Encounter (Signed)
Pt said he had sleep study 5 or 6 wks ago and still have not received the results.

## 2013-10-02 NOTE — Telephone Encounter (Signed)
Message forwarded to [place].  Results are not in Epic.

## 2013-10-03 NOTE — Telephone Encounter (Signed)
Spoke with Mariann Laster - reports are to be mailed to our office as [place] closed. Will call patient with final result once available.

## 2013-12-02 ENCOUNTER — Other Ambulatory Visit: Payer: Self-pay | Admitting: Internal Medicine

## 2013-12-02 NOTE — Telephone Encounter (Signed)
Rx was sent to pharmacy electronically. 

## 2013-12-13 ENCOUNTER — Other Ambulatory Visit: Payer: Self-pay

## 2014-01-03 ENCOUNTER — Other Ambulatory Visit: Payer: Self-pay | Admitting: Internal Medicine

## 2014-01-31 ENCOUNTER — Ambulatory Visit (INDEPENDENT_AMBULATORY_CARE_PROVIDER_SITE_OTHER): Payer: BC Managed Care – PPO | Admitting: Internal Medicine

## 2014-01-31 ENCOUNTER — Encounter: Payer: Self-pay | Admitting: Internal Medicine

## 2014-01-31 VITALS — BP 142/86 | HR 76 | Ht 69.0 in | Wt 191.1 lb

## 2014-01-31 DIAGNOSIS — I10 Essential (primary) hypertension: Secondary | ICD-10-CM

## 2014-01-31 DIAGNOSIS — G4731 Primary central sleep apnea: Secondary | ICD-10-CM | POA: Insufficient documentation

## 2014-01-31 DIAGNOSIS — E785 Hyperlipidemia, unspecified: Secondary | ICD-10-CM

## 2014-01-31 DIAGNOSIS — Z79899 Other long term (current) drug therapy: Secondary | ICD-10-CM

## 2014-01-31 DIAGNOSIS — I455 Other specified heart block: Secondary | ICD-10-CM

## 2014-01-31 DIAGNOSIS — G4761 Periodic limb movement disorder: Secondary | ICD-10-CM

## 2014-01-31 DIAGNOSIS — G4733 Obstructive sleep apnea (adult) (pediatric): Secondary | ICD-10-CM

## 2014-01-31 DIAGNOSIS — I2510 Atherosclerotic heart disease of native coronary artery without angina pectoris: Secondary | ICD-10-CM

## 2014-01-31 DIAGNOSIS — R0683 Snoring: Secondary | ICD-10-CM

## 2014-01-31 NOTE — Progress Notes (Signed)
OFFICE NOTE  Chief Complaint:  Follow-up, no complaints  Primary Care Physician: Wyatt Haste, MD  HPI:  Michael Terrell is a pleasant 58 year old male who was present although Dr. Rollene Fare. He is here today to establish care with me. His past medical history significant for an anterior wall MI in 2011. He underwent emergent catheterization and was sent to have an proximal LAD lesion. He had a 3.5 x 23 mm vision bare metal stent placed and postdilated to 3.77 mm. Subsequently had a recently December 2000 1140 chest discomfort which showed a patent stent and there was residual 50% RCA disease and 50% disease in the posterior lateral branch. Since that time he's been undergoing aggressive medical therapy, he continues to exercise and eat a fairly healthy diet. He's had no further chest pain episodes.  He has not required any additional nitroglycerin.  Lipid profile from September 2014 shows total cholesterol of 162, triglycerides 171, HDL 33 and LDL 95.   Michael Terrell was recently hospitalized for chest pain. He was felt to be atypical however he did undergo a nuclear stress test which was negative for ischemia. During his hospitalization he was noted to have marked bradycardia with second degree type II AV block. He is beta blocker was discontinued. After discharge he was placed on a monitor. He wore the monitor from 07/19/2013 until now. A report from [place] indicated that he's had several episodes of sinus bradycardia with a greater than 3 second pause on 07/20/2013. He also had marked sinus bradycardia with heart rates in the 20s on 07/21/2013. Several of the episodes occur at night while he is asleep and he is generally unaware of them.  He reports his chest pain has resolved.  Michael Terrell returns today for follow-up. He has been seen by Dr. Lovena Le in the interim for evaluation for possible pacemaker. Is felt that he's asymptomatic incidental most of his episodes are at  night there is concern for sleep apnea. He was referred for sleep study that was performed on 08/22/2013. This indicated mild obstructive sleep apnea with a preponderance of central sleep apnea. These events were apparently severe in the supine position. He was diagnosed with both obstructive sleep apnea, central sleep apnea and PLMS. It was recommended that he be fitted for CPAP with the likelihood of BiPAP titration based and the fact that he has central apnea.  PMHx:  Past Medical History  Diagnosis Date  . CAD (coronary artery disease)     BMS to LAD (2011)  . Acute anterior wall MI 10/2009  . Hypertension   . Hyperlipidemia   . Dyslipidemia   . Chest pain   . Sinus pause   . Snoring     Past Surgical History  Procedure Laterality Date  . Transthoracic echocardiogram  02/2012    EF 27-78%, grade 1 diastolic dysfunction; mild MR; LA mildly dilated; upper limit borderline LA enlargement  . Nm myocar perf wall motion  02/2010    bruce myoview - normal pattern of perfusion, low risk, no ischemia demonstrated, low risk  . Coronary angioplasty with stent placement  11/13/2009    3.5x77mm Vision BMS to LAD (Dr. Corky Downs)  . Cardiac catheterization  01/29/2010    patent LAD stent (3.5x24mm Vision - Dr. Corky Downs 11/03/2009); 50% prox PLA stenosis, 50% distal RCA stenosis (Dr. Norlene Duel)  . Knee surgery    . Shoulder surgery      FAMHx:  Family History  Problem Relation Age  of Onset  . CAD Father     MI in his 58s    SOCHx:   reports that he has never smoked. He has never used smokeless tobacco. He reports that he does not drink alcohol or use illicit drugs.  ALLERGIES:  Allergies  Allergen Reactions  . Lisinopril Rash    ROS: A comprehensive review of systems was negative.  HOME MEDS: Current Outpatient Prescriptions  Medication Sig Dispense Refill  . aspirin 81 MG tablet Take 162 mg by mouth daily.     Marland Kitchen atorvastatin (LIPITOR) 40 MG tablet TAKE ONE TABLET BY MOUTH ONE TIME  DAILY  30 tablet 5  . cetirizine (ZYRTEC) 10 MG tablet Take 10 mg by mouth daily.    . diphenhydramine-acetaminophen (TYLENOL PM) 25-500 MG TABS Take 1 tablet by mouth at bedtime as needed (for sleep/pain).    Marland Kitchen losartan (COZAAR) 50 MG tablet Take 1 tablet (50 mg total) by mouth daily. 30 tablet 11  . nitroGLYCERIN (NITROSTAT) 0.4 MG SL tablet Place 1 tablet (0.4 mg total) under the tongue every 5 (five) minutes as needed for chest pain. 25 tablet 3  . pantoprazole (PROTONIX) 40 MG tablet TAKE ONE TABLET BY MOUTH ONE TIME DAILY  30 tablet 8  . Polyvinyl Alcohol-Povidone (REFRESH OP) Apply 1 drop to eye as needed (for dry eyes).      No current facility-administered medications for this visit.    LABS/IMAGING: No results found for this or any previous visit (from the past 48 hour(s)). No results found.  VITALS: BP 142/86 mmHg  Pulse 76  Ht 5\' 9"  (1.753 m)  Wt 191 lb 1.6 oz (86.682 kg)  BMI 28.21 kg/m2  EXAM: General appearance: alert and no distress Lungs: clear to auscultation bilaterally Heart: regular rate and rhythm Extremities: extremities normal, atraumatic, no cyanosis or edema Neurologic: Grossly normal  EKG: Normal sinus rhythm at 76  ASSESSMENT: 1. History of anterior MI in 2011, status post proximal LAD bare-metal stent (3.5 x 23 mm vision) 2. Dyslipidemia-not at goal 3. Hypertension-controlled 4. Mobitz II block, sinus pauses - no indication for pacer, mostly at night 5. Severe central sleep apnea, mild OSA, PLMS  PLAN: 1.   Michael Terrell underwent a sleep study in June 2015 and had severe central sleep apnea and mild obstructive sleep apnea and it was recommended that he likely have CPAP titration as well as BiPAP evaluation. He also had PLMS with 36 limb movement events per hour. Unfortunately his study was just prior to the closing of [place] sleep center. He was never titrated or fitted with a mask. We will refer him to the [place] center  for further treatment and CPAP/BiPAP titration.   Pixie Casino, MD, [facility] Attending Cardiologist [facility]  Michael Terrell 01/31/2014, 12:56 PM

## 2014-01-31 NOTE — Patient Instructions (Signed)
Your physician recommends that you return for lab work at your convenience - you will need to be fasting.   Dr. Debara Pickett recommends Dr. Ernie Hew for primary care  Your physician wants you to follow-up in: 1 year with Dr. Debara Pickett. You will receive a reminder letter in the mail two months in advance. If you don't receive a letter, please call our office to schedule the follow-up appointment.

## 2014-01-31 NOTE — Addendum Note (Signed)
Addended by: Pixie Casino on: 01/31/2014 01:06 PM   Modules accepted: Orders

## 2014-02-03 ENCOUNTER — Telehealth: Payer: Self-pay | Admitting: *Deleted

## 2014-02-03 NOTE — Telephone Encounter (Signed)
LMTCB - need to inform patient that Dr. Debara Pickett ordered CPAP titration based on sleep study

## 2014-02-04 ENCOUNTER — Telehealth: Payer: Self-pay | Admitting: Internal Medicine

## 2014-02-04 NOTE — Telephone Encounter (Signed)
Returning your call from yesterday. °

## 2014-02-04 NOTE — Telephone Encounter (Signed)
Scheduled for CPAP titration 03/03/2014

## 2014-02-04 NOTE — Telephone Encounter (Signed)
Spoke with patient and informed him he met criteria for sleep apnea and will need a CPAP titration. Patient agreed with this plan. Dr. Debara Pickett had previously ordered the test. Message forwarded to the scheduling department to setup CPAP titration

## 2014-02-08 LAB — LIPID PANEL
Cholesterol: 133 mg/dL (ref 0–200)
HDL: 38 mg/dL — ABNORMAL LOW (ref >39)
LDL Cholesterol: 71 mg/dL (ref 0–99)
Total CHOL/HDL Ratio: 3.5 Ratio
Triglycerides: 119 mg/dL (ref <150)
VLDL: 24 mg/dL (ref 0–40)

## 2014-02-08 LAB — COMPREHENSIVE METABOLIC PANEL
ALT: 14 U/L (ref 0–53)
AST: 19 U/L (ref 0–37)
Albumin: 4 g/dL (ref 3.5–5.2)
Alkaline Phosphatase: 107 U/L (ref 39–117)
BUN: 15 mg/dL (ref 6–23)
CO2: 29 mEq/L (ref 19–32)
Calcium: 9 mg/dL (ref 8.4–10.5)
Chloride: 105 mEq/L (ref 96–112)
Creat: 1.04 mg/dL (ref 0.50–1.35)
Glucose, Bld: 94 mg/dL (ref 70–99)
Potassium: 5 mEq/L (ref 3.5–5.3)
Sodium: 137 mEq/L (ref 135–145)
Total Bilirubin: 0.4 mg/dL (ref 0.2–1.2)
Total Protein: 6.8 g/dL (ref 6.0–8.3)

## 2014-02-10 ENCOUNTER — Encounter: Payer: Self-pay | Admitting: Internal Medicine

## 2014-02-12 ENCOUNTER — Encounter: Payer: Self-pay | Admitting: *Deleted

## 2014-03-13 ENCOUNTER — Ambulatory Visit (HOSPITAL_BASED_OUTPATIENT_CLINIC_OR_DEPARTMENT_OTHER): Payer: BLUE CROSS/BLUE SHIELD | Attending: Internal Medicine | Admitting: Radiology

## 2014-03-13 VITALS — Ht 69.0 in | Wt 187.0 lb

## 2014-03-13 DIAGNOSIS — R0683 Snoring: Secondary | ICD-10-CM | POA: Insufficient documentation

## 2014-03-13 DIAGNOSIS — G4733 Obstructive sleep apnea (adult) (pediatric): Secondary | ICD-10-CM | POA: Insufficient documentation

## 2014-03-13 DIAGNOSIS — G4761 Periodic limb movement disorder: Secondary | ICD-10-CM | POA: Diagnosis not present

## 2014-03-13 DIAGNOSIS — G4731 Primary central sleep apnea: Secondary | ICD-10-CM | POA: Diagnosis not present

## 2014-03-13 DIAGNOSIS — I455 Other specified heart block: Secondary | ICD-10-CM

## 2014-04-05 NOTE — Sleep Study (Signed)
   NAME: Michael Terrell DATE OF BIRTH:  Dec 31, 1955 MEDICAL RECORD NUMBER [id number]  LOCATION: [facility]  PHYSICIAN: Zaharah Amir A  DATE OF STUDY: 03/13/2014  SLEEP STUDY TYPE: Positive Airway Pressure Titration               REFERRING PHYSICIAN: Pixie Casino., MD  INDICATION FOR STUDY: Abnormal previous sleep study with obstructive and central sleep apnea, snoring, daytime fatigue  EPWORTH SLEEPINESS SCORE:  12, positive for excessive daytime sleepiness HEIGHT: 5\' 9"  (175.3 cm)  WEIGHT: 187 lb (84.823 kg)    Body mass index is 27.6 kg/(m^2).  NECK SIZE: 16 in.  MEDICATIONS:  Polyvinyl Alcohol-Povidone (REFRESH OP) 1 drop, As needed pantoprazole (PROTONIX) 40 MG tablet nitroGLYCERIN (NITROSTAT) 0.4 MG SL tablet 0.4 mg, Every 5 min PRN losartan (COZAAR) 50 MG tablet 50 mg, Daily diphenhydramine-acetaminophen (TYLENOL PM) 25-500 MG TABS 1 tablet, At bedtime PRN cetirizine (ZYRTEC) 10 MG tablet 10 mg, Daily atorvastatin (LIPITOR) 40 MG tablet aspirin 81 MG tablet   SLEEP ARCHITECTURE:  The patient slept for 282.5 minutes out of a sleep period of time of 378 minutes, giving a 73% sleep efficiency.  Sleep latency was 8.5 minutes.  There was abnormal delayed latency to REM sleep at 203 minutes.  RESPIRATORY DATA:  CPAP was initiated at 5 cm and was advanced to 16 cm water pressure.  Due to continued significant events.  He was changed to BiPAP initially at 18/14 and this was titrated to 22/18.  His AHI was 0 at 22/18.  OXYGEN DATA:  Baseline oxygen saturation was 95%.  The lowest oxygen saturation with non-REM sleep was 89% and with REM sleep was 88% at 13 cm water pressure.  CARDIAC DATA:  The patient was in sinus rhythm, although there were occasional PACs noted.  The average heart rate 65 bpm.  MOVEMENT/PARASOMNIA:  There were total of 148 periodic limb movements with an index of 31.4.  IMPRESSION/ RECOMMENDATION:   Previously documented obstructive  and central apnea. Recommend initial use of BiPAP with Bi/Flex at a pressure of 22/18 with a heated humidifier.  The patient used a [employer], Simplus FFM, medium during the CPAP/BiPAP titration.  Recommend download in 30 days and sleep clinic evaluation.   [place], American Board of Sleep Medicine  ELECTRONICALLY SIGNED ON:  04/05/2014, 3:55 PM [place] PH: [phone]   FX: [phone] [place]

## 2014-04-05 NOTE — Addendum Note (Signed)
Addended by: Shelva Majestic A on: 04/05/2014 04:10 PM   Modules accepted: Level of Service

## 2014-04-07 ENCOUNTER — Telehealth: Payer: Self-pay | Admitting: Internal Medicine

## 2014-04-07 NOTE — Telephone Encounter (Signed)
Pt called in stating that he had a sleep study done on 1/14 and he has not received the results yet. Please call  Thanks

## 2014-04-08 NOTE — Telephone Encounter (Signed)
Returned a call to patient informed him that Dr. Claiborne Billings read the study over the weekend and gave to me yesterday. I will do the referral to [facility]. Once his benefits are verified, they will contact him to set up therapy. Patient voiced understanding of the conversation and will await a call from [facility].

## 2014-05-28 ENCOUNTER — Encounter: Payer: Self-pay | Admitting: Cardiovascular Disease

## 2014-06-21 ENCOUNTER — Other Ambulatory Visit: Payer: Self-pay | Admitting: Internal Medicine

## 2014-06-23 NOTE — Telephone Encounter (Signed)
Rx(s) sent to pharmacy electronically.  

## 2014-07-03 ENCOUNTER — Other Ambulatory Visit: Payer: Self-pay | Admitting: *Deleted

## 2014-07-03 MED ORDER — PANTOPRAZOLE SODIUM 40 MG PO TBEC: 40.0000 mg | DELAYED_RELEASE_TABLET | Freq: Every day | ORAL | Status: DC

## 2014-07-03 NOTE — Telephone Encounter (Signed)
Rx(s) sent to pharmacy electronically.  

## 2014-07-21 ENCOUNTER — Encounter: Payer: Self-pay | Admitting: Cardiovascular Disease

## 2014-07-21 ENCOUNTER — Ambulatory Visit (INDEPENDENT_AMBULATORY_CARE_PROVIDER_SITE_OTHER): Payer: 59 | Admitting: Cardiovascular Disease

## 2014-07-21 VITALS — BP 153/90 | HR 66 | Ht 69.0 in | Wt 195.1 lb

## 2014-07-21 DIAGNOSIS — R0683 Snoring: Secondary | ICD-10-CM

## 2014-07-21 DIAGNOSIS — G4731 Primary central sleep apnea: Secondary | ICD-10-CM

## 2014-07-21 DIAGNOSIS — I10 Essential (primary) hypertension: Secondary | ICD-10-CM

## 2014-07-21 DIAGNOSIS — G4733 Obstructive sleep apnea (adult) (pediatric): Secondary | ICD-10-CM

## 2014-07-21 NOTE — Patient Instructions (Signed)
Your physician recommends that you schedule a follow-up appointment as needed with Dr. Claiborne Billings.

## 2014-07-23 ENCOUNTER — Encounter: Payer: Self-pay | Admitting: Cardiovascular Disease

## 2014-07-23 NOTE — Progress Notes (Signed)
Patient ID: ANDRIS BROTHERS, male   DOB: 08-23-1955, 59 y.o.   MRN: [id number]     HPI: KESHAV WINEGAR, is a 59 y.o. male who presents for sleep clinic evaluation following recent initiation of BiPAP therapy.  Mr. Royann Shivers is a former patient of Dr. Rollene Fare and recently has been seeing Dr. Debara Pickett.  He has a remote history of an anterior wall myocardial infarction in 2011 at which time he underwent stenting of his LAD.  He has a history of hypertension, hyperlipidemia, as well as transient second-degree heart block.  In 2015.  He underwent a sleep study at the [facility].  He was noted have mild obstructive sleep apnea but had upon rinse of central apneic events.  Events were severe in the supine position.  There was evidence for heavy snoring, decreased percentage of Rehm sleep and he had an abnormal arousal index.  APAP with likelihood for BiPAP titration due to his central events was recommended but the patient never had follow-up of this.  He subsequent he was referred for a titration at [facility] in January 2016.  He was titrated up to a high pressure of 22/18.  He received an AirCurve 10 Vauto ResMed unit on 05/05/2014.  He has been using a Respironics AmaraView full face mask, medium.  He typically goes to bed between 10:30 and 11 PM and wakes up at 6 AM.  A download was obtained from 06/17/2014 through 07/16/2014.  This revealed 100%.  Days of usage and 1% days greater than 4 hours area.  He is averaging only 5 hours and 21 minutes.  He has been set on an auto unit and his 95th percentile EPAP pressures 13.8 with a maximum of 16-1/95%, IPAP pressure of 17.8 with a maximum of 19.9.  On only 2 days did he have a significant leak.  At times he has noted some difficulty with this mask.  An Epworth Sleepiness Scale score was recalculated today and this still endorsed high at 13.   Epworth Sleepiness Scale: Situation   Chance of Dozing/Sleeping (0 = never ,  1 = slight chance , 2 = moderate chance , 3 = high chance )   sitting and reading 2   watching TV 2   sitting inactive in a public place 1   being a passenger in a motor vehicle for an hour or more 2   lying down in the afternoon 3   sitting and talking to someone 1   sitting quietly after lunch (no alcohol) 1   while stopped for a few minutes in traffic as the driver 1   Total Score  13    Past Medical History  Diagnosis Date  . CAD (coronary artery disease)     BMS to LAD (2011)  . Acute anterior wall MI 10/2009  . Hypertension   . Hyperlipidemia   . Dyslipidemia   . Chest pain   . Sinus pause   . Snoring     Past Surgical History  Procedure Laterality Date  . Transthoracic echocardiogram  02/2012    EF 49-67%, grade 1 diastolic dysfunction; mild MR; LA mildly dilated; upper limit borderline LA enlargement  . Nm myocar perf wall motion  02/2010    bruce myoview - normal pattern of perfusion, low risk, no ischemia demonstrated, low risk  . Coronary angioplasty with stent placement  11/13/2009    3.5x33m Vision BMS to LAD (Dr. TCorky Downs  . Cardiac catheterization  01/29/2010    patent LAD stent (3.5x29m Vision - Dr. TCorky Downs9/07/2009); 50% prox PLA stenosis, 50% distal RCA stenosis (Dr. HNorlene Duel  . Knee surgery    . Shoulder surgery      Allergies  Allergen Reactions  . Lisinopril Rash    Current Outpatient Prescriptions  Medication Sig Dispense Refill  . aspirin 81 MG tablet Take 162 mg by mouth daily.     .Marland Kitchenatorvastatin (LIPITOR) 40 MG tablet TAKE ONE TABLET BY MOUTH ONE TIME DAILY  30 tablet 5  . cetirizine (ZYRTEC) 10 MG tablet Take 10 mg by mouth daily.    . diphenhydramine-acetaminophen (TYLENOL PM) 25-500 MG TABS Take 1 tablet by mouth at bedtime as needed (for sleep/pain).    .Marland Kitchenlosartan (COZAAR) 50 MG tablet Take 1 tablet (50 mg total) by mouth daily. 30 tablet 8  . nitroGLYCERIN (NITROSTAT) 0.4 MG SL tablet Place 1 tablet (0.4 mg total) under the tongue  every 5 (five) minutes as needed for chest pain. 25 tablet 3  . pantoprazole (PROTONIX) 40 MG tablet Take 1 tablet (40 mg total) by mouth daily. 90 tablet 1  . Polyvinyl Alcohol-Povidone (REFRESH OP) Apply 1 drop to eye as needed (for dry eyes).      No current facility-administered medications for this visit.    History   Social History  . Marital Status: Married    Spouse Name: N/A  . Number of Children: 2  . Years of Education: N/A   Occupational History  . [place]urse[place] [employer] [place] [place] [employer] Main Topics  . Smoking status: Never Smoker   . Smokeless tobacco: Never Used  . Alcohol Use: No  . Drug Use: No  . Sexual Activity: Not on file   Other Topics Concern  . Not on file   Social History Narrative    Family History  Problem Relation Age of Onset  . CAD Father     MI in his 387s    ROS General: Negative; No fevers, chills, or night sweats HEENT: Negative; No changes in vision or hearing, sinus congestion, difficulty swallowing Pulmonary: Negative; No cough, wheezing, shortness of breath, hemoptysis Cardiovascular: Negative; No chest pain, presyncope, syncope, palpatations GI: Negative; No nausea, vomiting, diarrhea, or abdominal pain GU: Negative; No dysuria, hematuria, or difficulty voiding Musculoskeletal: Negative; no myalgias, joint pain, or weakness Hematologic: Negative; no easy bruising, bleeding Endocrine: Negative; no heat/cold intolerance Neuro: Negative; no changes in balance, headaches Skin: Negative; No rashes or skin lesions Psychiatric: Negative; No behavioral problems, depression Sleep: Positive for OSA, now on BiPAP therapy with mild residual daytime sleepiness.  bruxism, restless legs, hypnogognic hallucinations, no cataplexy   Physical Exam BP 153/90 mmHg  Pulse 66  Ht 5' 9"  (1.753 m)  Wt 195 lb 1.6 oz (88.497 kg)  BMI 28.80 kg/m2  Wt Readings from Last 3 Encounters:  07/21/14 195 lb 1.6 oz (88.497 kg)  03/13/14 187  lb (84.823 kg)  01/31/14 191 lb 1.6 oz (86.682 kg)   General: Alert, oriented, no distress.  Skin: normal turgor, no rashes HEENT: Normocephalic, atraumatic. Pupils round and reactive; sclera anicteric; extraocular muscles intact; Fundi without hemorrhages or exudates Nose without nasal septal hypertrophy Mouth/Parynx benign; Mallinpatti scale 3 Neck: No JVD, no carotid briuts Lungs: clear to ausculatation and percussion; no wheezing or rales  Chest wall: No tenderness to palpation Heart: RRR, s1 s2 normal  Abdomen: soft, nontender; no hepatosplenomehaly, BS+; abdominal aorta nontender and not dilated  by palpation. Back: No CVA tenderness Pulses 2+ Extremities: no clubbinbg cyanosis or edema, Homan's sign negative  Neurologic: grossly nonfocal; cranial nerves intact. Psychological: Normal affect and mood.       LABS:  BMP Latest Ref Rng 02/07/2014 09/24/2013 07/09/2013  Glucose 70 - 99 mg/dL 94 94 95  BUN 6 - 23 mg/dL 15 15 14   Creatinine 0.50 - 1.35 mg/dL 1.04 1.17 1.16  Sodium 135 - 145 mEq/L 137 140 138  Potassium 3.5 - 5.3 mEq/L 5.0 5.3 4.8  Chloride 96 - 112 mEq/L 105 106 103  CO2 19 - 32 mEq/L 29 26 23   Calcium 8.4 - 10.5 mg/dL 9.0 9.3 8.7     Hepatic Function Latest Ref Rng 02/07/2014 09/24/2013 11/16/2012  Total Protein 6.0 - 8.3 g/dL 6.8 6.8 7.2  Albumin 3.5 - 5.2 g/dL 4.0 4.2 4.3  AST 0 - 37 U/L 19 24 19   ALT 0 - 53 U/L 14 22 16   Alk Phosphatase 39 - 117 U/L 107 113 95  Total Bilirubin 0.2 - 1.2 mg/dL 0.4 0.5 0.5     CBC Latest Ref Rng 07/10/2013 07/09/2013 07/08/2013  WBC 4.0 - 10.5 K/uL 5.2 4.4 4.5  Hemoglobin 13.0 - 17.0 g/dL 11.6(L) 11.3(L) 12.3(L)  Hematocrit 39.0 - 52.0 % 36.0(L) 35.2(L) 36.9(L)  Platelets 150 - 400 K/uL 224 189 219     Lipid Panel     Component Value Date/Time   CHOL 133 02/07/2014 0833   CHOL 130 09/24/2013 0916   TRIG 119 02/07/2014 0833   TRIG 104 09/24/2013 0916   HDL 38* 02/07/2014 0833   HDL 39* 09/24/2013 0916   CHOLHDL  3.5 02/07/2014 0833   VLDL 24 02/07/2014 0833   LDLCALC 71 02/07/2014 0833   LDLCALC 70 09/24/2013 0916     RADIOLOGY: No results found.    ASSESSMENT AND PLAN: Mr. Kamerin Grumbine is a 59 year old gentleman who has complex sleep apnea with history of central apneic events.  He is on a BiPAP auto unit and on his most recent download his AHI is 6.8, which represents an apnea index of 4.2 and an hypotony index of 2.6.  His central apnea index was 2.0, and obstructive 2.0.  He feels improved with therapy.  He is meeting compliance standards.  However, he is not sleeping adequate duration averaging only 5 hours and 21 minutes.  I discussed with him at length the importance of improved sleep duration and ideally he should be sleeping 7-8 hours per night.  We also discussed data regarding increased mortality with sleep deprivation, particularly of less than 5 hours.  He has had some issues with a mask leak.  He may be able to use the new restaurant extreme wear mask.  I discussed with him.  Avoidance of supine sleep.  Since there was a significant positional component to his sleep apnea.  We discussed good sleep hygiene.  A download will be obtained in 30 days.  Further recommendations will be made at that time.  I will see him on an as-needed basis if problems arise.   Time spent: 25 minutes  Troy Sine, MD, [facility]  07/23/2014 7:05 PM

## 2014-08-14 ENCOUNTER — Other Ambulatory Visit: Payer: Self-pay

## 2014-08-14 MED ORDER — LOSARTAN POTASSIUM 50 MG PO TABS: 50.0000 mg | ORAL_TABLET | Freq: Every day | ORAL | Status: DC

## 2014-08-14 NOTE — Telephone Encounter (Signed)
Rx(s) sent to pharmacy electronically.  

## 2014-12-08 ENCOUNTER — Other Ambulatory Visit: Payer: Self-pay | Admitting: Internal Medicine

## 2014-12-08 NOTE — Telephone Encounter (Signed)
REFILL 

## 2015-03-13 ENCOUNTER — Other Ambulatory Visit: Payer: Self-pay | Admitting: Internal Medicine

## 2015-05-04 ENCOUNTER — Other Ambulatory Visit: Payer: Self-pay | Admitting: Family Medicine

## 2015-05-04 DIAGNOSIS — R52 Pain, unspecified: Secondary | ICD-10-CM

## 2015-05-04 DIAGNOSIS — R609 Edema, unspecified: Secondary | ICD-10-CM

## 2015-05-07 ENCOUNTER — Ambulatory Visit
Admission: RE | Admit: 2015-05-07 | Discharge: 2015-05-07 | Disposition: A | Discharge status: Home / Self Care | Payer: 59 | Source: Ambulatory Visit | Attending: Family Medicine | Admitting: Family Medicine

## 2015-05-07 DIAGNOSIS — R609 Edema, unspecified: Secondary | ICD-10-CM

## 2015-05-07 DIAGNOSIS — R52 Pain, unspecified: Secondary | ICD-10-CM

## 2015-06-05 DIAGNOSIS — Z23 Encounter for immunization: Secondary | ICD-10-CM | POA: Diagnosis not present

## 2015-07-15 DIAGNOSIS — E559 Vitamin D deficiency, unspecified: Secondary | ICD-10-CM | POA: Diagnosis not present

## 2015-07-15 DIAGNOSIS — E039 Hypothyroidism, unspecified: Secondary | ICD-10-CM | POA: Diagnosis not present

## 2015-07-15 DIAGNOSIS — I10 Essential (primary) hypertension: Secondary | ICD-10-CM | POA: Diagnosis not present

## 2015-07-15 DIAGNOSIS — R5383 Other fatigue: Secondary | ICD-10-CM | POA: Diagnosis not present

## 2015-07-15 DIAGNOSIS — D649 Anemia, unspecified: Secondary | ICD-10-CM | POA: Diagnosis not present

## 2015-07-17 DIAGNOSIS — E039 Hypothyroidism, unspecified: Secondary | ICD-10-CM | POA: Diagnosis not present

## 2015-07-17 DIAGNOSIS — K219 Gastro-esophageal reflux disease without esophagitis: Secondary | ICD-10-CM | POA: Diagnosis not present

## 2015-07-17 DIAGNOSIS — I10 Essential (primary) hypertension: Secondary | ICD-10-CM | POA: Diagnosis not present

## 2015-07-17 DIAGNOSIS — Z6826 Body mass index (BMI) 26.0-26.9, adult: Secondary | ICD-10-CM | POA: Diagnosis not present

## 2015-10-01 DIAGNOSIS — H04122 Dry eye syndrome of left lacrimal gland: Secondary | ICD-10-CM | POA: Diagnosis not present

## 2015-10-01 DIAGNOSIS — H18603 Keratoconus, unspecified, bilateral: Secondary | ICD-10-CM | POA: Diagnosis not present

## 2015-10-01 DIAGNOSIS — H16103 Unspecified superficial keratitis, bilateral: Secondary | ICD-10-CM | POA: Diagnosis not present

## 2015-10-01 DIAGNOSIS — H04121 Dry eye syndrome of right lacrimal gland: Secondary | ICD-10-CM | POA: Diagnosis not present

## 2015-10-23 DIAGNOSIS — Z23 Encounter for immunization: Secondary | ICD-10-CM | POA: Diagnosis not present

## 2015-11-03 DIAGNOSIS — Z5309 Procedure and treatment not carried out because of other contraindication: Secondary | ICD-10-CM | POA: Diagnosis not present

## 2015-11-03 DIAGNOSIS — I10 Essential (primary) hypertension: Secondary | ICD-10-CM | POA: Diagnosis not present

## 2015-11-03 DIAGNOSIS — I2510 Atherosclerotic heart disease of native coronary artery without angina pectoris: Secondary | ICD-10-CM | POA: Diagnosis not present

## 2015-11-03 DIAGNOSIS — E785 Hyperlipidemia, unspecified: Secondary | ICD-10-CM | POA: Diagnosis not present

## 2015-11-05 DIAGNOSIS — M1711 Unilateral primary osteoarthritis, right knee: Secondary | ICD-10-CM | POA: Diagnosis not present

## 2015-11-05 DIAGNOSIS — M7631 Iliotibial band syndrome, right leg: Secondary | ICD-10-CM | POA: Diagnosis not present

## 2015-11-05 DIAGNOSIS — M25561 Pain in right knee: Secondary | ICD-10-CM | POA: Diagnosis not present

## 2016-01-18 DIAGNOSIS — E039 Hypothyroidism, unspecified: Secondary | ICD-10-CM | POA: Diagnosis not present

## 2016-01-20 DIAGNOSIS — Z6827 Body mass index (BMI) 27.0-27.9, adult: Secondary | ICD-10-CM | POA: Diagnosis not present

## 2016-01-20 DIAGNOSIS — Z23 Encounter for immunization: Secondary | ICD-10-CM | POA: Diagnosis not present

## 2016-01-20 DIAGNOSIS — G4700 Insomnia, unspecified: Secondary | ICD-10-CM | POA: Diagnosis not present

## 2016-01-20 DIAGNOSIS — E039 Hypothyroidism, unspecified: Secondary | ICD-10-CM | POA: Diagnosis not present

## 2016-01-20 DIAGNOSIS — I10 Essential (primary) hypertension: Secondary | ICD-10-CM | POA: Diagnosis not present

## 2016-03-18 ENCOUNTER — Emergency Department (HOSPITAL_COMMUNITY): Payer: BLUE CROSS/BLUE SHIELD

## 2016-03-18 ENCOUNTER — Encounter (HOSPITAL_COMMUNITY): Payer: Self-pay

## 2016-03-18 ENCOUNTER — Emergency Department (HOSPITAL_COMMUNITY)
Admission: EM | Admit: 2016-03-18 | Discharge: 2016-03-18 | Disposition: A | Discharge status: Home / Self Care | Payer: BLUE CROSS/BLUE SHIELD | Attending: Emergency Medicine | Admitting: Emergency Medicine

## 2016-03-18 DIAGNOSIS — I10 Essential (primary) hypertension: Secondary | ICD-10-CM | POA: Insufficient documentation

## 2016-03-18 DIAGNOSIS — Y999 Unspecified external cause status: Secondary | ICD-10-CM | POA: Diagnosis not present

## 2016-03-18 DIAGNOSIS — S42302A Unspecified fracture of shaft of humerus, left arm, initial encounter for closed fracture: Secondary | ICD-10-CM | POA: Diagnosis not present

## 2016-03-18 DIAGNOSIS — I252 Old myocardial infarction: Secondary | ICD-10-CM | POA: Insufficient documentation

## 2016-03-18 DIAGNOSIS — Z955 Presence of coronary angioplasty implant and graft: Secondary | ICD-10-CM | POA: Diagnosis not present

## 2016-03-18 DIAGNOSIS — I2510 Atherosclerotic heart disease of native coronary artery without angina pectoris: Secondary | ICD-10-CM | POA: Insufficient documentation

## 2016-03-18 DIAGNOSIS — Z7982 Long term (current) use of aspirin: Secondary | ICD-10-CM | POA: Diagnosis not present

## 2016-03-18 DIAGNOSIS — W010XXA Fall on same level from slipping, tripping and stumbling without subsequent striking against object, initial encounter: Secondary | ICD-10-CM | POA: Diagnosis not present

## 2016-03-18 DIAGNOSIS — S4992XA Unspecified injury of left shoulder and upper arm, initial encounter: Secondary | ICD-10-CM | POA: Diagnosis present

## 2016-03-18 DIAGNOSIS — Y93K1 Activity, walking an animal: Secondary | ICD-10-CM | POA: Diagnosis not present

## 2016-03-18 DIAGNOSIS — Y929 Unspecified place or not applicable: Secondary | ICD-10-CM | POA: Diagnosis not present

## 2016-03-18 MED ORDER — OXYCODONE-ACETAMINOPHEN 5-325 MG PO TABS: 1.0000 | ORAL_TABLET | ORAL | 0 refills | Status: DC | PRN

## 2016-03-18 MED ORDER — HYDROMORPHONE HCL 1 MG/ML IJ SOLN
0.5000 mg | Freq: Once | INTRAMUSCULAR | Status: AC
  Administered 2016-03-18: 0.5 mg via INTRAMUSCULAR
  Filled 2016-03-18: qty 1

## 2016-03-18 MED ORDER — ONDANSETRON 4 MG PO TBDP
4.0000 mg | ORAL_TABLET | Freq: Once | ORAL | Status: AC
  Administered 2016-03-18: 4 mg via ORAL
  Filled 2016-03-18: qty 1

## 2016-03-18 NOTE — Discharge Instructions (Signed)
Take percocet for breakthrough pain, do not drink alcohol, drive, care for children or do other critical tasks while taking percocet. ° °Please follow with your primary care doctor in the next 2 days for a check-up. They must obtain records for further management.  ° °Do not hesitate to return to the Emergency Department for any new, worsening or concerning symptoms.  ° °

## 2016-03-18 NOTE — ED Triage Notes (Signed)
Pt states that he was walking his dog today and slipped on a patch of ice. He landed on his L shoulder. Denies hitting head or LOC. A&Ox4.

## 2016-03-18 NOTE — ED Provider Notes (Signed)
[facility] DEPT Provider Note   CSN: [id number] Arrival date & time: 03/18/16  1818     History   Chief Complaint Chief Complaint  Patient presents with  . Fall  . Arm Pain    L     HPI   Blood pressure 129/96, pulse 72, temperature 98 F (36.7 C), resp. rate 18, SpO2 100 %.  Michael Terrell is a 61 y.o. male complaining of Severe left (nondominant) upper arm pain status post mechanical fall he slipped on ice when he was walking the dog. He fell on the lateral aspect of his body. He is right-hand-dominant. He states that the pain in the arm is severe. There was no head trauma, he is not anticoagulated no cervicalgia, numbness, weakness, chest pain, abdominal pain, hip pain. He's been ambulatory since the event. No pain medications taken prior to arrival.   Past Medical History:  Diagnosis Date  . Acute anterior wall MI ([place]) 10/2009  . CAD (coronary artery disease)    BMS to LAD (2011)  . Chest pain   . Dyslipidemia   . Hyperlipidemia   . Hypertension   . Sinus pause   . Snoring     Patient Active Problem List   Diagnosis Date Noted  . 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 OSA (obstructive sleep apnea) 01/31/2014  . PLMD (periodic limb movement disorder) 01/31/2014  . Second degree AV block, Mobitz type II 07/30/2013  . Sinus pause 07/30/2013  . Snoring 07/30/2013  . Chest pain 07/08/2013  . CAD (coronary artery disease) 06/05/2013  . Dyslipidemia 06/05/2013  . HTN (hypertension) 06/05/2013    Past Surgical History:  Procedure Laterality Date  . CARDIAC CATHETERIZATION  01/29/2010   patent LAD stent (3.5x33mm Vision - Dr. Corky Downs 11/03/2009); 50% prox PLA stenosis, 50% distal RCA stenosis (Dr. Norlene Duel)  . CORONARY ANGIOPLASTY WITH STENT PLACEMENT  11/13/2009   3.5x45mm Vision BMS to LAD (Dr. Corky Downs)  . KNEE SURGERY    . NM MYOCAR PERF WALL MOTION  02/2010   bruce myoview - normal pattern of perfusion, low risk, no ischemia demonstrated, low risk  .  SHOULDER SURGERY    . TRANSTHORACIC ECHOCARDIOGRAM  02/2012   EF [id number], grade 1 diastolic dysfunction; mild MR; LA mildly dilated; upper limit borderline LA enlargement       Home Medications    Prior to Admission medications   Medication Sig Start Date End Date Taking? Authorizing Provider  aspirin 81 MG tablet Take 162 mg by mouth daily.     Historical Provider, MD  atorvastatin (LIPITOR) 40 MG tablet TAKE ONE TABLET BY MOUTH ONE TIME DAILY  01/03/14   Pixie Casino, MD  cetirizine (ZYRTEC) 10 MG tablet Take 10 mg by mouth daily.    Historical Provider, MD  diphenhydramine-acetaminophen (TYLENOL PM) 25-500 MG TABS Take 1 tablet by mouth at bedtime as needed (for sleep/pain).    Historical Provider, MD  losartan (COZAAR) 50 MG tablet Take 1 tablet (50 mg total) by mouth daily. Appointment needed for future refills 03/13/15   Pixie Casino, MD  nitroGLYCERIN (NITROSTAT) 0.4 MG SL tablet Place 1 tablet (0.4 mg total) under the tongue every 5 (five) minutes as needed for chest pain. 06/05/13   Pixie Casino, MD  oxyCODONE-acetaminophen (PERCOCET) 5-325 MG tablet Take 1 tablet by mouth every 4 (four) hours as needed. 03/18/16   Harland Aguiniga, PA-C  pantoprazole (PROTONIX) 40 MG tablet Take 1 tablet (40 mg total) by  mouth daily. 07/03/14   Pixie Casino, MD  Polyvinyl Alcohol-Povidone (REFRESH OP) Apply 1 drop to eye as needed (for dry eyes).     Historical Provider, MD    Family History Family History  Problem Relation Age of Onset  . CAD Father     MI in his 10s    Social History Social History  Substance Use Topics  . Smoking status: Never Smoker  . Smokeless tobacco: Never Used  . Alcohol use No     Allergies   Lisinopril   Review of Systems Review of Systems  10 systems reviewed and found to be negative, except as noted in the HPI.   Physical Exam Updated Vital Signs BP 129/96 (BP Location: Left Arm)   Pulse 72   Temp 98 F (36.7 C)   Resp 18   SpO2 100%     Physical Exam  Constitutional: He is oriented to person, place, and time. He appears well-developed and well-nourished. No distress.  HENT:  Head: Normocephalic and atraumatic.  Mouth/Throat: Oropharynx is clear and moist.  Eyes: Conjunctivae and EOM are normal. Pupils are equal, round, and reactive to light.  Neck: Normal range of motion.  No midline C-spine  tenderness to palpation or step-offs appreciated. Patient has full range of motion without pain.  Grip strength, biceps, triceps 5/5 bilaterally;  can differentiate between pinprick and light touch bilaterally.   Cardiovascular: Normal rate, regular rhythm and intact distal pulses.   Pulmonary/Chest: Effort normal and breath sounds normal.  Abdominal: Soft. There is no tenderness.  Musculoskeletal: He exhibits edema, tenderness and deformity.  Left upper extremity with midshaft deformity. Can not abduct the shoulder. Full range of motion to elbow and wrist, no snuffbox tenderness. Radial pulses 2+. Grip strength is 5 out of 5, full sensation in all nerve distributions.  Neurological: He is alert and oriented to person, place, and time.  Skin: Capillary refill takes less than 2 seconds. He is not diaphoretic.  Psychiatric: He has a normal mood and affect.  Nursing note and vitals reviewed.    ED Treatments / Results  Labs (all labs ordered are listed, but only abnormal results are displayed) Labs Reviewed - No data to display  EKG  EKG Interpretation None       Radiology Dg Shoulder Left  Result Date: 03/18/2016 CLINICAL DATA:  Pt slipped and fell while walking his dog this evening. Pain throughout left humerus. EXAM: LEFT SHOULDER - 2+ VIEW COMPARISON:  None. FINDINGS: Glenohumeral joint is intact. No evidence of scapular fracture or humeral fracture. The acromioclavicular joint is intact. Partially imaged oblique fracture through the midshaft LEFT humerus. IMPRESSION: 1. LEFT midshaft humeral fracture partially  imaged. 2. No shoulder dislocation. Electronically Signed   By: Suzy Bouchard M.D.   On: 03/18/2016 19:45   Dg Humerus Left  Result Date: 03/18/2016 CLINICAL DATA:  Fall, left arm pain. EXAM: LEFT HUMERUS - 2+ VIEW COMPARISON:  None. FINDINGS: There is a significantly displaced fracture within the mid left humerus, greater than 2 cm displacement, with overriding of the fracture fragments. Remainder of the left humerus appears intact and normally aligned. Adjacent soft tissues are unremarkable. IMPRESSION: Prominently displaced fracture within the mid left humerus. Electronically Signed   By: Franki Cabot M.D.   On: 03/18/2016 19:47    Procedures Procedures (including critical care time)  Medications Ordered in ED Medications  HYDROmorphone (DILAUDID) injection 0.5 mg (0.5 mg Intramuscular Given 03/18/16 2034)  ondansetron (ZOFRAN-ODT) disintegrating tablet 4  mg (4 mg Oral Given 03/18/16 2034)     Initial Impression / Assessment and Plan / ED Course  I have reviewed the triage vital signs and the nursing notes.  Pertinent labs & imaging results that were available during my care of the patient were reviewed by me and considered in my medical decision making (see chart for details).     Vitals:   03/18/16 1825  BP: 129/96  Pulse: 72  Resp: 18  Temp: 98 F (36.7 C)  SpO2: 100%    Medications  HYDROmorphone (DILAUDID) injection 0.5 mg (0.5 mg Intramuscular Given 03/18/16 2034)  ondansetron (ZOFRAN-ODT) disintegrating tablet 4 mg (4 mg Oral Given 03/18/16 2034)    Michael Terrell is 61 y.o. male presenting with Severe left upper arm pain after mechanical slip and fall on ice, no other trauma. Neurovascularly intact. X-ray shows a displaced humerus midshaft fracture. This is a closed injury. He's neurovascularly intact. Full sensation in all distal nerve distributions. Orthopedic consult from Dr. Stann Mainland appreciated: He is evaluated the x-rays and recommends coaptation splint and  having the patient follow with him in the office in 1-2 weeks.  Evaluation does not show pathology that would require ongoing emergent intervention or inpatient treatment. Pt is hemodynamically stable and mentating appropriately. Discussed findings and plan with patient/guardian, who agrees with care plan. All questions answered. Return precautions discussed and outpatient follow up given.      Final Clinical Impressions(s) / ED Diagnoses   Final diagnoses:  Closed fracture of shaft of left humerus, unspecified fracture morphology, initial encounter    New Prescriptions Current Discharge Medication List    START taking these medications   Details  oxyCODONE-acetaminophen (PERCOCET) 5-325 MG tablet Take 1 tablet by mouth every 4 (four) hours as needed. Qty: 29 tablet, Refills: 0         Monico Blitz, PA-C 03/18/16 2159    Fatima Blank, MD 03/19/16 2245

## 2016-03-21 DIAGNOSIS — M25522 Pain in left elbow: Secondary | ICD-10-CM | POA: Diagnosis not present

## 2016-03-22 NOTE — H&P (Signed)
PREOPERATIVE H&P  Chief Complaint: left humeral shaft fracture  HPI: Michael Terrell is a 61 y.o. male who presents for preoperative history and physical with a diagnosis of left humeral shaft fracture. Symptoms are rated as moderate to severe, and have been worsening.  This is significantly impairing activities of daily living.  He has elected for surgical management.   This occurred January 1920 fell on the ice. He had acute onset severe pain. Denies neurologic symptoms. He was seen in the emergency room, splinted, and then evaluated in the office.  Past Medical History:  Diagnosis Date  . Acute anterior wall MI ([place]) 10/2009  . CAD (coronary artery disease)    BMS to LAD (2011)  . Chest pain   . Dyslipidemia   . Hyperlipidemia   . Hypertension   . Sinus pause   . Snoring    Past Surgical History:  Procedure Laterality Date  . CARDIAC CATHETERIZATION  01/29/2010   patent LAD stent (3.5x18mm Vision - Dr. Corky Downs 11/03/2009); 50% prox PLA stenosis, 50% distal RCA stenosis (Dr. Norlene Duel)  . CORONARY ANGIOPLASTY WITH STENT PLACEMENT  11/13/2009   3.5x74mm Vision BMS to LAD (Dr. Corky Downs)  . KNEE SURGERY    . NM MYOCAR PERF WALL MOTION  02/2010   bruce myoview - normal pattern of perfusion, low risk, no ischemia demonstrated, low risk  . SHOULDER SURGERY    . TRANSTHORACIC ECHOCARDIOGRAM  02/2012   EF [id number], grade 1 diastolic dysfunction; mild MR; LA mildly dilated; upper limit borderline LA enlargement   Social History   Social History  . Marital status: Married    Spouse name: N/A  . Number of children: 2  . Years of education: N/A   Occupational History  . Nurse, 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[employer]   Social History Main Topics  . Smoking status: Never Smoker  . Smokeless tobacco: Never Used  . Alcohol use No  . Drug use: No  . Sexual activity: Not on file   Other Topics Concern  . Not on file   Social History Narrative  . No narrative on file    Family History  Problem Relation Age of Onset  . CAD Father     MI in his 69s   Allergies  Allergen Reactions  . Lisinopril Rash   Prior to Admission medications   Medication Sig Start Date End Date Taking? Authorizing Provider  aspirin 81 MG tablet Take 81 mg by mouth daily.    Yes Historical Provider, MD  cetirizine (ZYRTEC) 10 MG tablet Take 10 mg by mouth daily as needed for allergies.    Yes Historical Provider, MD  Cholecalciferol (VITAMIN D-3) 5000 units TABS Take 5,000 Units by mouth daily.   Yes Historical Provider, MD  levothyroxine (SYNTHROID, LEVOTHROID) 50 MCG tablet Take 50 mcg by mouth daily. 01/20/16  Yes Historical Provider, MD  losartan (COZAAR) 100 MG tablet Take 100 mg by mouth daily.   Yes Historical Provider, MD  nitroGLYCERIN (NITROSTAT) 0.4 MG SL tablet Place 1 tablet (0.4 mg total) under the tongue every 5 (five) minutes as needed for chest pain. 06/05/13  Yes Pixie Casino, MD  oxyCODONE-acetaminophen (PERCOCET) 5-325 MG tablet Take 1 tablet by mouth every 4 (four) hours as needed. Patient taking differently: Take 1 tablet by mouth every 4 (four) hours as needed for moderate pain.  03/18/16  Yes Nicole Pisciotta, PA-C  pantoprazole (PROTONIX) 40 MG tablet Take 1 tablet (40 mg total) by  mouth daily. 07/03/14  Yes Pixie Casino, MD  Polyvinyl Alcohol-Povidone (REFRESH OP) Place 1 drop into both eyes 3 (three) times daily as needed (for dry eyes).    Yes Historical Provider, MD     Positive ROS: All other systems have been reviewed and were otherwise negative with the exception of those mentioned in the HPI and as above.  Physical Exam: General: Alert, no acute distress Cardiovascular: No pedal edema Respiratory: No cyanosis, no use of accessory musculature GI: No organomegaly, abdomen is soft and non-tender Skin: No lesions in the area of chief complaint Neurologic: Sensation intact distally Psychiatric: Patient is competent for consent with normal mood and  affect Lymphatic: No axillary or cervical lymphadenopathy  MUSCULOSKELETAL: Left hand has sensation intact throughout, all fingers flex extend and abduct. He is currently in a coaptation splint. Denies neck pain.  X-rays demonstrate a significantly displaced humeral shaft fracture, greater than 100% with no cortical contact. There is also angulation viewed on the AP plane, at least 30.  Assessment: left humeral shaft fracture, acute, displaced   Plan: Plan for Procedure(s): OPEN REDUCTION INTERNAL FIXATION (ORIF) LEFT HUMERAL SHAFT FRACTURE, acute, displaced  The risks benefits and alternatives were discussed with the patient including but not limited to the risks of nonoperative treatment, versus surgical intervention including infection, bleeding, nerve injury, malunion, nonunion, the need for revision surgery, hardware prominence, hardware failure, the need for hardware removal, blood clots, cardiopulmonary complications, morbidity, mortality, among others, and they were willing to proceed.     Johnny Bridge, MD Cell [phone]   03/22/2016 11:58 AM

## 2016-03-23 ENCOUNTER — Other Ambulatory Visit: Payer: Self-pay | Admitting: Orthopedic Surgery

## 2016-03-25 ENCOUNTER — Inpatient Hospital Stay (HOSPITAL_COMMUNITY): Admission: RE | Admit: 2016-03-25 | Payer: 59 | Source: Ambulatory Visit

## 2016-03-25 DIAGNOSIS — Y999 Unspecified external cause status: Secondary | ICD-10-CM | POA: Diagnosis not present

## 2016-03-25 DIAGNOSIS — S42322A Displaced transverse fracture of shaft of humerus, left arm, initial encounter for closed fracture: Secondary | ICD-10-CM | POA: Diagnosis not present

## 2016-03-25 DIAGNOSIS — S42392A Other fracture of shaft of left humerus, initial encounter for closed fracture: Secondary | ICD-10-CM | POA: Diagnosis not present

## 2016-03-25 DIAGNOSIS — G8918 Other acute postprocedural pain: Secondary | ICD-10-CM | POA: Diagnosis not present

## 2016-03-26 ENCOUNTER — Ambulatory Visit (HOSPITAL_COMMUNITY)
Admission: RE | Admit: 2016-03-26 | Payer: BLUE CROSS/BLUE SHIELD | Source: Ambulatory Visit | Admitting: Orthopedic Surgery

## 2016-03-26 ENCOUNTER — Encounter (HOSPITAL_COMMUNITY): Admission: RE | Payer: Self-pay | Source: Ambulatory Visit

## 2016-03-26 SURGERY — OPEN REDUCTION INTERNAL FIXATION (ORIF) HUMERAL SHAFT FRACTURE
Anesthesia: General | Laterality: Left

## 2016-04-08 DIAGNOSIS — S42392D Other fracture of shaft of left humerus, subsequent encounter for fracture with routine healing: Secondary | ICD-10-CM | POA: Diagnosis not present

## 2016-04-20 DIAGNOSIS — E039 Hypothyroidism, unspecified: Secondary | ICD-10-CM | POA: Diagnosis not present

## 2016-04-20 DIAGNOSIS — E559 Vitamin D deficiency, unspecified: Secondary | ICD-10-CM | POA: Diagnosis not present

## 2016-04-20 DIAGNOSIS — D649 Anemia, unspecified: Secondary | ICD-10-CM | POA: Diagnosis not present

## 2016-04-22 DIAGNOSIS — I519 Heart disease, unspecified: Secondary | ICD-10-CM | POA: Diagnosis not present

## 2016-04-22 DIAGNOSIS — E039 Hypothyroidism, unspecified: Secondary | ICD-10-CM | POA: Diagnosis not present

## 2016-04-22 DIAGNOSIS — Z6826 Body mass index (BMI) 26.0-26.9, adult: Secondary | ICD-10-CM | POA: Diagnosis not present

## 2016-04-22 DIAGNOSIS — I10 Essential (primary) hypertension: Secondary | ICD-10-CM | POA: Diagnosis not present

## 2016-04-22 DIAGNOSIS — E559 Vitamin D deficiency, unspecified: Secondary | ICD-10-CM | POA: Diagnosis not present

## 2016-05-02 DIAGNOSIS — H18603 Keratoconus, unspecified, bilateral: Secondary | ICD-10-CM | POA: Diagnosis not present

## 2016-05-02 DIAGNOSIS — H16103 Unspecified superficial keratitis, bilateral: Secondary | ICD-10-CM | POA: Diagnosis not present

## 2016-05-02 DIAGNOSIS — I10 Essential (primary) hypertension: Secondary | ICD-10-CM | POA: Diagnosis not present

## 2016-05-02 DIAGNOSIS — E785 Hyperlipidemia, unspecified: Secondary | ICD-10-CM | POA: Diagnosis not present

## 2016-05-02 DIAGNOSIS — S0502XA Injury of conjunctiva and corneal abrasion without foreign body, left eye, initial encounter: Secondary | ICD-10-CM | POA: Diagnosis not present

## 2016-05-02 DIAGNOSIS — I2510 Atherosclerotic heart disease of native coronary artery without angina pectoris: Secondary | ICD-10-CM | POA: Diagnosis not present

## 2016-05-02 DIAGNOSIS — Z5309 Procedure and treatment not carried out because of other contraindication: Secondary | ICD-10-CM | POA: Diagnosis not present

## 2016-05-09 DIAGNOSIS — S42392D Other fracture of shaft of left humerus, subsequent encounter for fracture with routine healing: Secondary | ICD-10-CM | POA: Diagnosis not present

## 2016-06-06 DIAGNOSIS — S42392D Other fracture of shaft of left humerus, subsequent encounter for fracture with routine healing: Secondary | ICD-10-CM | POA: Diagnosis not present

## 2016-07-11 DIAGNOSIS — S42392D Other fracture of shaft of left humerus, subsequent encounter for fracture with routine healing: Secondary | ICD-10-CM | POA: Diagnosis not present

## 2016-08-10 DIAGNOSIS — E785 Hyperlipidemia, unspecified: Secondary | ICD-10-CM | POA: Diagnosis not present

## 2016-08-10 DIAGNOSIS — Z0000 Encounter for general adult medical examination without abnormal findings: Secondary | ICD-10-CM | POA: Diagnosis not present

## 2016-08-10 DIAGNOSIS — Z125 Encounter for screening for malignant neoplasm of prostate: Secondary | ICD-10-CM | POA: Diagnosis not present

## 2016-08-12 DIAGNOSIS — Z6829 Body mass index (BMI) 29.0-29.9, adult: Secondary | ICD-10-CM | POA: Diagnosis not present

## 2016-08-12 DIAGNOSIS — Z0000 Encounter for general adult medical examination without abnormal findings: Secondary | ICD-10-CM | POA: Diagnosis not present

## 2016-08-12 DIAGNOSIS — H6123 Impacted cerumen, bilateral: Secondary | ICD-10-CM | POA: Diagnosis not present

## 2016-08-12 DIAGNOSIS — Z1211 Encounter for screening for malignant neoplasm of colon: Secondary | ICD-10-CM | POA: Diagnosis not present

## 2016-08-12 DIAGNOSIS — Z23 Encounter for immunization: Secondary | ICD-10-CM | POA: Diagnosis not present

## 2016-08-25 DIAGNOSIS — H18603 Keratoconus, unspecified, bilateral: Secondary | ICD-10-CM | POA: Diagnosis not present

## 2016-08-25 DIAGNOSIS — H16103 Unspecified superficial keratitis, bilateral: Secondary | ICD-10-CM | POA: Diagnosis not present

## 2017-02-14 DIAGNOSIS — E039 Hypothyroidism, unspecified: Secondary | ICD-10-CM | POA: Diagnosis not present

## 2017-02-14 DIAGNOSIS — E782 Mixed hyperlipidemia: Secondary | ICD-10-CM | POA: Diagnosis not present

## 2017-02-14 DIAGNOSIS — E559 Vitamin D deficiency, unspecified: Secondary | ICD-10-CM | POA: Diagnosis not present

## 2017-02-16 DIAGNOSIS — E559 Vitamin D deficiency, unspecified: Secondary | ICD-10-CM | POA: Diagnosis not present

## 2017-02-16 DIAGNOSIS — E785 Hyperlipidemia, unspecified: Secondary | ICD-10-CM | POA: Diagnosis not present

## 2017-02-16 DIAGNOSIS — E039 Hypothyroidism, unspecified: Secondary | ICD-10-CM | POA: Diagnosis not present

## 2017-02-16 DIAGNOSIS — Z23 Encounter for immunization: Secondary | ICD-10-CM | POA: Diagnosis not present

## 2017-02-16 DIAGNOSIS — Z6828 Body mass index (BMI) 28.0-28.9, adult: Secondary | ICD-10-CM | POA: Diagnosis not present

## 2017-05-15 DIAGNOSIS — E559 Vitamin D deficiency, unspecified: Secondary | ICD-10-CM | POA: Diagnosis not present

## 2017-05-15 DIAGNOSIS — Z13220 Encounter for screening for lipoid disorders: Secondary | ICD-10-CM | POA: Diagnosis not present

## 2017-05-15 DIAGNOSIS — I10 Essential (primary) hypertension: Secondary | ICD-10-CM | POA: Diagnosis not present

## 2017-05-15 DIAGNOSIS — E039 Hypothyroidism, unspecified: Secondary | ICD-10-CM | POA: Diagnosis not present

## 2017-05-17 DIAGNOSIS — E559 Vitamin D deficiency, unspecified: Secondary | ICD-10-CM | POA: Diagnosis not present

## 2017-05-17 DIAGNOSIS — I10 Essential (primary) hypertension: Secondary | ICD-10-CM | POA: Diagnosis not present

## 2017-05-17 DIAGNOSIS — E039 Hypothyroidism, unspecified: Secondary | ICD-10-CM | POA: Diagnosis not present

## 2017-05-17 DIAGNOSIS — Z6828 Body mass index (BMI) 28.0-28.9, adult: Secondary | ICD-10-CM | POA: Diagnosis not present

## 2017-06-20 DIAGNOSIS — I2510 Atherosclerotic heart disease of native coronary artery without angina pectoris: Secondary | ICD-10-CM | POA: Diagnosis not present

## 2017-06-20 DIAGNOSIS — E785 Hyperlipidemia, unspecified: Secondary | ICD-10-CM | POA: Diagnosis not present

## 2017-06-20 DIAGNOSIS — Z5309 Procedure and treatment not carried out because of other contraindication: Secondary | ICD-10-CM | POA: Diagnosis not present

## 2017-06-20 DIAGNOSIS — I10 Essential (primary) hypertension: Secondary | ICD-10-CM | POA: Diagnosis not present

## 2017-08-06 ENCOUNTER — Encounter: Payer: Self-pay | Admitting: Neurology

## 2017-08-09 ENCOUNTER — Encounter: Payer: Self-pay | Admitting: Neurology

## 2017-08-09 ENCOUNTER — Institutional Professional Consult (permissible substitution): Payer: Self-pay | Admitting: Neurology

## 2017-08-09 ENCOUNTER — Ambulatory Visit: Payer: BLUE CROSS/BLUE SHIELD | Admitting: Neurology

## 2017-08-09 VITALS — BP 140/88 | HR 76 | Ht 69.0 in | Wt 191.0 lb

## 2017-08-09 DIAGNOSIS — E663 Overweight: Secondary | ICD-10-CM

## 2017-08-09 DIAGNOSIS — G2581 Restless legs syndrome: Secondary | ICD-10-CM | POA: Diagnosis not present

## 2017-08-09 DIAGNOSIS — G4733 Obstructive sleep apnea (adult) (pediatric): Secondary | ICD-10-CM

## 2017-08-09 DIAGNOSIS — G4731 Primary central sleep apnea: Secondary | ICD-10-CM | POA: Diagnosis not present

## 2017-08-09 NOTE — Patient Instructions (Signed)
Thank you for choosing [facility] for your sleep related care! It was nice to meet you today! I appreciate that you entrust me with your sleep related healthcare concerns. I hope, I was able to address at least some of your concerns today, and that I can help you feel reassured and also get better.    Here is what we discussed today and what we came up with as our plan for you:    Based on your symptoms and your exam I believe you are still at risk for obstructive sleep apnea or central sleep apnea or both and would benefit from reevaluation as it has been some years and you need new supplies and may be an updated machine or different machine, like a CPAP. Therefore, I think we should proceed with a sleep study to determine how severe your sleep apnea is. If you have more than mild OSA, I want you to consider ongoing treatment with CPAP. Please remember, the risks and ramifications of moderate to severe obstructive sleep apnea or OSA are: Cardiovascular disease, including congestive heart failure, stroke, difficult to control hypertension, arrhythmias, and even type 2 diabetes has been linked to untreated OSA. Sleep apnea causes disruption of sleep and sleep deprivation in most cases, which, in turn, can cause recurrent headaches, problems with memory, mood, concentration, focus, and vigilance. Most people with untreated sleep apnea report excessive daytime sleepiness, which can affect their ability to drive. Please do not drive if you feel sleepy.   I will likely see you back after your sleep study to go over the test results and where to go from there. We will call you after your sleep study to advise about the results (most likely, you will hear from [place], my nurse) and to set up an appointment at the time, as necessary.    Our sleep lab administrative assistant will call you to schedule your sleep study. If you don't hear back from her by about 2 weeks from now, please feel free  to call her at [phone]. You can leave a message with your phone number and concerns, if you get the voicemail box. She will call back as soon as possible.

## 2017-08-09 NOTE — Progress Notes (Signed)
Subjective:    Patient ID: Michael Terrell is a 62 y.o. male.  HPI     Michael Age, MD, PhD [facility] [address], [address] [place], [place] [address]  Dear Dr. Woody Seller,   I saw your patient, Michael Terrell, upon your kind request, in my neurologic clinic today for initial consultation of his sleep disorder, in particular, re-evaluation of his prior diagnosis of obstructive sleep apnea. The patient is unaccompanied today. As you know, [name] is a 62 year old right-handed gentleman with an underlying medical history of coronary artery disease, status post MI, status post stent placement, reflux disease, hypertension, hyperlipidemia, and overweight state who was previously diagnosed with sleep apnea and placed on BiPAP therapy. He no longer uses his machine. He had sleep study testing in [place] in 2015. His baseline sleep study from June 2015 showed an AHI of 7.9 per hour, average oxygen saturation 95%, nadir of 90%. 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 He had a CPAP titration study on 03/13/2014. He was treated with CPAP and then BiPAP up to a pressure of 22/18. I reviewed your office note from 06/20/2017, which you kindly included.  He was started on auto BiPAP therapy. He no longer uses his machine. His download shows intermittent usage last year, last use in early November of last year. He was not consistent with his BiPAP use and averaged less than 4 hours of usage typically per night. His Epworth sleepiness score is 10 out of 24, fatigue score is 20 out of 63. He is married and lives with his wife. They have 2 children. He is a nonsmoker and does not utilize alcohol, he drinks caffeine in the form of tea, 2-3 glasses per day on average. He has 2 dogs. His bedtime is around 10:30, rise time around 5:30. He does not have difficulty falling asleep but has difficulty staying asleep. He was originally  on BiPAP but could not tolerate the pressure, auto BiPAP became more tolerable but he did not notice any improvement of his sleep difficulties including daytime tiredness, nonrestorative sleep, restless sleep. He has intermittent restless leg symptoms affecting his left lower extremity distally. He has no family history of OSA, his brother was tested at one point. His weight has been stable. He does not currently take any narcotic pain medication, took some last year when he broke his left humerus. He had surgery and hardware in place.   His Past Medical History Is Significant For: Past Medical History:  Diagnosis Date  . Acute anterior wall MI ([place]) 10/2009  . CAD (coronary artery disease)    BMS to LAD (2011)  . Chest pain   . Dyslipidemia   . Hyperlipidemia   . Hypertension   . Sinus pause   . Snoring     His Past Surgical History Is Significant For: Past Surgical History:  Procedure Laterality Date  . CARDIAC CATHETERIZATION  01/29/2010   patent LAD stent (3.5x95mm Vision - Dr. Corky Downs 11/03/2009); 50% prox PLA stenosis, 50% distal RCA stenosis (Dr. Norlene Duel)  . CORONARY ANGIOPLASTY WITH STENT PLACEMENT  11/13/2009   3.5x61mm Vision BMS to LAD (Dr. Corky Downs)  . KNEE SURGERY    . NM MYOCAR PERF WALL MOTION  02/2010   bruce myoview - normal pattern of perfusion, low risk, no ischemia demonstrated, low risk  . SHOULDER SURGERY    . TRANSTHORACIC ECHOCARDIOGRAM  02/2012   EF 55-65%,  grade 1 diastolic dysfunction; mild MR; LA mildly dilated; upper limit borderline LA enlargement    His Family History Is Significant For: Family History  Problem Relation Terrell of Onset  . CAD Father        MI in his 30s    His Social History Is Significant For: Social History   Socioeconomic History  . Marital status: Married    Spouse name: Not on file  . Number of children: 2  . Years of education: Not on file  . Highest education level: Not on file  Occupational History  . 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 Financial resource strain: Not on file  . Food insecurity:    Worry: Not on file    Inability: Not on file  . Transportation needs:    Medical: Not on file    Non-medical: Not on file  Tobacco Use  . Smoking status: Never Smoker  . Smokeless tobacco: Never Used  Substance and Sexual Activity  . Alcohol use: No    Alcohol/week: 0.0 oz  . Drug use: No  . Sexual activity: Not on file  Lifestyle  . Physical activity:    Days per week: Not on file    Minutes per session: Not on file  . Stress: Not on file  Relationships  . Social connections:    Talks on phone: Not on file    Gets together: Not on file    Attends religious service: Not on file    Active member of club or organization: Not on file    Attends meetings of clubs or organizations: Not on file    Relationship status: Not on file  Other Topics Concern  . Not on file  Social History Narrative  . Not on file    His Allergies Are:  Allergies  Allergen Reactions  . Metoprolol   . Lisinopril Rash  :   His Current Medications Are:  Outpatient Encounter Medications as of 08/09/2017  Medication Sig  . amLODipine (NORVASC) 2.5 MG tablet Take 2.5 mg by mouth daily.  Marland Kitchen aspirin 81 MG tablet Take 81 mg by mouth daily.   . cetirizine (ZYRTEC) 10 MG tablet Take 10 mg by mouth daily as needed for allergies.   . Cholecalciferol (VITAMIN D-3 PO) Take 2,500 Units by mouth daily.  Marland Kitchen ezetimibe (ZETIA) 10 MG tablet Take 10 mg by mouth daily.  Marland Kitchen levothyroxine (SYNTHROID, LEVOTHROID) 75 MCG tablet Take 75 mcg by mouth daily before breakfast.  . losartan (COZAAR) 100 MG tablet Take 100 mg by mouth daily.  . montelukast (SINGULAIR) 10 MG tablet Take 10 mg by mouth at bedtime.  . nitroGLYCERIN (NITROSTAT) 0.4 MG SL tablet Place 1 tablet (0.4 mg total) under the tongue every 5 (five) minutes as needed for chest pain.  . pantoprazole (PROTONIX)  20 MG tablet Take 20 mg by mouth daily.  . Polyvinyl Alcohol-Povidone (REFRESH OP) Place 1 drop into both eyes 3 (three) times daily as needed (for dry eyes).   . rosuvastatin (CRESTOR) 5 MG tablet Take 5 mg by mouth daily.  . [DISCONTINUED] Cholecalciferol (VITAMIN D-3) 5000 units TABS Take 5,000 Units by mouth daily.  . [DISCONTINUED] oxyCODONE-acetaminophen (PERCOCET) 5-325 MG tablet Take 1 tablet by mouth every 4 (four) hours as needed. (Patient taking differently: Take 1 tablet by mouth every 4 (four) hours as needed for moderate pain. )  . [DISCONTINUED] pantoprazole (PROTONIX) 40 MG  tablet Take 1 tablet (40 mg total) by mouth daily.   No facility-administered encounter medications on file as of 08/09/2017.   :  Review of Systems:  Out of a complete 14 point review of systems, all are reviewed and negative with the exception of these symptoms as listed below: Review of Systems  Neurological:       Pt presents today to discuss his bipap. Pt has not used his bipap in 6 months. Pt is using [facility]. Pt's bipap is about 3 years.  Epworth Sleepiness Scale 0= would never doze 1= slight chance of dozing 2= moderate chance of dozing 3= high chance of dozing  Sitting and reading: 2 Watching TV: 2 Sitting inactive in a public place (ex. Theater or meeting): 0 As a passenger in a car for an hour without a break: 2 Lying down to rest in the afternoon: 3 Sitting and talking to someone: 0 Sitting quietly after lunch (no alcohol): 1 In a car, while stopped in traffic: 0 Total: 10     Objective:  Neurological Exam  Physical Exam Physical Examination:   Vitals:   08/09/17 1347  BP: 140/88  Pulse: 76   General Examination: The patient is a very pleasant 62 y.o. male in no acute distress. He appears well-developed and well-nourished and well groomed.   HEENT: Normocephalic, atraumatic, pupils are equal, round and reactive to light and accommodation. Extraocular tracking is good  without limitation to gaze excursion or nystagmus noted. Normal smooth pursuit is noted. Hearing is grossly intact. Face is symmetric with normal facial animation and normal facial sensation. Speech is clear with no dysarthria noted. There is no hypophonia. There is no lip, neck/head, jaw or voice tremor. Neck is supple with full range of passive and active motion. There are no carotid bruits on auscultation. Oropharynx exam reveals: mild mouth dryness, adequate dental hygiene and moderate airway crowding, due to tonsils of 2+, larger uvula, smaller airway entry. Mallampati is class II. Tongue protrudes centrally and palate elevates symmetrically. Neck size is 17.75 inches.   Chest: Clear to auscultation without wheezing, rhonchi or crackles noted.  Heart: S1+S2+0, regular and normal without murmurs, rubs or gallops noted.   Abdomen: Soft, non-tender and non-distended with normal bowel sounds appreciated on auscultation.  Extremities: There is no pitting edema in the distal lower extremities bilaterally.   Skin: Warm and dry without trophic changes noted.  Musculoskeletal: exam reveals no obvious joint deformities, tenderness or joint swelling or erythema.   Neurologically:  Mental status: The patient is awake, alert and oriented in all 4 spheres. His immediate and remote memory, attention, language skills and fund of knowledge are appropriate. There is no evidence of aphasia, agnosia, apraxia or anomia. Speech is clear with normal prosody and enunciation. Thought process is linear. Mood is normal and affect is normal.  Cranial nerves II - XII are as described above under HEENT exam. In addition: shoulder shrug is normal with equal shoulder height noted. Motor exam: Normal bulk, strength and tone is noted. There is no drift, tremor or rebound. Romberg is negative. Fine motor skills and coordination: intact with normal finger taps, normal hand movements, normal rapid alternating patting, normal foot  taps and normal foot agility.  Cerebellar testing: No dysmetria or intention tremor. There is no truncal or gait ataxia.  Sensory exam: intact to light touch in the upper and lower extremities.  Gait, station and balance: He stands easily. No veering to one side is noted. No leaning  to one side is noted. Posture is Terrell-appropriate and stance is narrow based. Gait shows normal stride length and normal pace. No problems turning are noted.   Assessment and Plan:  In summary, DORAL VENTRELLA is a very pleasant 62 y.o.-year old male with an underlying medical history of coronary artery disease, status post MI, status post stent placement, reflux disease, hypertension, hyperlipidemia, and overweight state who was Previously diagnosed with primary central sleep apnea as well as obstructive sleep apnea. He has had difficulty tolerating standard BiPAP therapy as well as auto BiPAP therapy. He would benefit from reevaluation and optimization of treatment. I explained to him the difference between central sleep apnea and obstructive sleep apnea, and the different treatment modalities such as CPAP, AutoPap, BiPAP and auto BiPAP. I had a long chat with the patient about my findings and the diagnosis of OSA and CSA, its prognosis and treatment options. We talked about medical treatments, surgical interventions and non-pharmacological approaches. I explained in particular the risks and ramifications of untreated moderate to severe OSA, especially with respect to developing cardiovascular disease down the Road, including congestive heart failure, difficult to treat hypertension, cardiac arrhythmias, or stroke. Even type 2 diabetes has, in part, been linked to untreated OSA. Symptoms of untreated OSA include daytime sleepiness, memory problems, mood irritability and mood disorder such as depression and anxiety, lack of energy, as well as recurrent headaches, especially morning headaches. We talked about trying to maintain  a healthy lifestyle in general, as well as the importance of weight control. I encouraged the patient to eat healthy, exercise daily and keep well hydrated, to keep a scheduled bedtime and wake time routine, to not skip any meals and eat healthy snacks in between meals. I advised the patient not to drive when feeling sleepy. I recommended the following at this time: sleep study with potential positive airway pressure titration. (We will score hypopneas at 3%).   I explained the sleep test procedure to the patient and also outlined possible surgical and non-surgical treatment options of OSA, including the use of a custom-made dental device (which would require a referral to a specialist dentist or oral surgeon), upper airway surgical options, such as pillar implants, radiofrequency surgery, tongue base surgery, and UPPP (which would involve a referral to an ENT surgeon). Rarely, jaw surgery such as mandibular advancement may be considered.  I also explained the CPAP treatment option to the patient, who indicated that he would be willing to try CPAP or BiPAP if the need arises. I explained the importance of being compliant with PAP treatment, not only for insurance purposes but primarily to improve His symptoms, and for the patient's long term health benefit, including to reduce His cardiovascular risks. I answered all his questions today and the patient was in agreement. I would like to see him back after the sleep study is completed and encouraged him to call with any interim questions, concerns, problems or updates.   Thank you very much for allowing me to participate in the care of this nice patient. If I can be of any further assistance to you please do not hesitate to call me at [phone].  Sincerely,   Michael Age, MD, PhD

## 2017-08-15 DIAGNOSIS — Z1329 Encounter for screening for other suspected endocrine disorder: Secondary | ICD-10-CM | POA: Diagnosis not present

## 2017-08-15 DIAGNOSIS — Z1159 Encounter for screening for other viral diseases: Secondary | ICD-10-CM | POA: Diagnosis not present

## 2017-08-15 DIAGNOSIS — Z125 Encounter for screening for malignant neoplasm of prostate: Secondary | ICD-10-CM | POA: Diagnosis not present

## 2017-08-15 DIAGNOSIS — E559 Vitamin D deficiency, unspecified: Secondary | ICD-10-CM | POA: Diagnosis not present

## 2017-08-15 DIAGNOSIS — Z13220 Encounter for screening for lipoid disorders: Secondary | ICD-10-CM | POA: Diagnosis not present

## 2017-08-15 DIAGNOSIS — M791 Myalgia, unspecified site: Secondary | ICD-10-CM | POA: Diagnosis not present

## 2017-08-15 DIAGNOSIS — Z114 Encounter for screening for human immunodeficiency virus [HIV]: Secondary | ICD-10-CM | POA: Diagnosis not present

## 2017-08-18 ENCOUNTER — Other Ambulatory Visit: Payer: Self-pay | Admitting: Family Medicine

## 2017-08-18 DIAGNOSIS — E559 Vitamin D deficiency, unspecified: Secondary | ICD-10-CM | POA: Diagnosis not present

## 2017-08-18 DIAGNOSIS — I2510 Atherosclerotic heart disease of native coronary artery without angina pectoris: Secondary | ICD-10-CM

## 2017-08-18 DIAGNOSIS — E782 Mixed hyperlipidemia: Secondary | ICD-10-CM

## 2017-08-18 DIAGNOSIS — Z6828 Body mass index (BMI) 28.0-28.9, adult: Secondary | ICD-10-CM | POA: Diagnosis not present

## 2017-08-18 DIAGNOSIS — Z0000 Encounter for general adult medical examination without abnormal findings: Secondary | ICD-10-CM | POA: Diagnosis not present

## 2017-08-18 DIAGNOSIS — H6123 Impacted cerumen, bilateral: Secondary | ICD-10-CM | POA: Diagnosis not present

## 2017-08-18 DIAGNOSIS — I10 Essential (primary) hypertension: Secondary | ICD-10-CM

## 2017-08-18 DIAGNOSIS — Z1211 Encounter for screening for malignant neoplasm of colon: Secondary | ICD-10-CM | POA: Diagnosis not present

## 2017-08-18 DIAGNOSIS — M791 Myalgia, unspecified site: Secondary | ICD-10-CM | POA: Diagnosis not present

## 2017-08-18 DIAGNOSIS — I519 Heart disease, unspecified: Secondary | ICD-10-CM | POA: Diagnosis not present

## 2017-08-18 DIAGNOSIS — Z23 Encounter for immunization: Secondary | ICD-10-CM | POA: Diagnosis not present

## 2017-08-28 ENCOUNTER — Ambulatory Visit
Admission: RE | Admit: 2017-08-28 | Discharge: 2017-08-28 | Disposition: A | Discharge status: Home / Self Care | Payer: Self-pay | Source: Ambulatory Visit | Attending: Family Medicine | Admitting: Family Medicine

## 2017-08-28 DIAGNOSIS — I10 Essential (primary) hypertension: Secondary | ICD-10-CM

## 2017-08-28 DIAGNOSIS — I2510 Atherosclerotic heart disease of native coronary artery without angina pectoris: Secondary | ICD-10-CM

## 2017-08-28 DIAGNOSIS — I6523 Occlusion and stenosis of bilateral carotid arteries: Secondary | ICD-10-CM | POA: Diagnosis not present

## 2017-08-28 DIAGNOSIS — E782 Mixed hyperlipidemia: Secondary | ICD-10-CM

## 2017-08-29 DIAGNOSIS — R748 Abnormal levels of other serum enzymes: Secondary | ICD-10-CM | POA: Diagnosis not present

## 2017-08-29 DIAGNOSIS — M791 Myalgia, unspecified site: Secondary | ICD-10-CM | POA: Diagnosis not present

## 2017-08-30 ENCOUNTER — Telehealth: Payer: Self-pay

## 2017-08-30 NOTE — Telephone Encounter (Signed)
We have attempted to call the patient three times to schedule sleep study.  Patient has been unavailable at the phone numbers we have on file and has not returned our calls.  At this point we will send a letter asking patient to please contact the sleep lab to schedule their sleep study.  If patient calls back we will schedule them for their sleep study.  

## 2017-09-01 DIAGNOSIS — M791 Myalgia, unspecified site: Secondary | ICD-10-CM | POA: Diagnosis not present

## 2017-09-01 DIAGNOSIS — K219 Gastro-esophageal reflux disease without esophagitis: Secondary | ICD-10-CM | POA: Diagnosis not present

## 2017-09-01 DIAGNOSIS — Z6828 Body mass index (BMI) 28.0-28.9, adult: Secondary | ICD-10-CM | POA: Diagnosis not present

## 2017-09-01 DIAGNOSIS — I519 Heart disease, unspecified: Secondary | ICD-10-CM | POA: Diagnosis not present

## 2017-09-14 DIAGNOSIS — M791 Myalgia, unspecified site: Secondary | ICD-10-CM | POA: Diagnosis not present

## 2017-09-14 DIAGNOSIS — E785 Hyperlipidemia, unspecified: Secondary | ICD-10-CM | POA: Diagnosis not present

## 2017-09-18 DIAGNOSIS — I10 Essential (primary) hypertension: Secondary | ICD-10-CM | POA: Diagnosis not present

## 2017-09-18 DIAGNOSIS — I519 Heart disease, unspecified: Secondary | ICD-10-CM | POA: Diagnosis not present

## 2017-09-18 DIAGNOSIS — E782 Mixed hyperlipidemia: Secondary | ICD-10-CM | POA: Diagnosis not present

## 2017-09-18 DIAGNOSIS — M791 Myalgia, unspecified site: Secondary | ICD-10-CM | POA: Diagnosis not present

## 2017-09-18 DIAGNOSIS — Z6828 Body mass index (BMI) 28.0-28.9, adult: Secondary | ICD-10-CM | POA: Diagnosis not present

## 2017-09-20 DIAGNOSIS — S42392D Other fracture of shaft of left humerus, subsequent encounter for fracture with routine healing: Secondary | ICD-10-CM | POA: Diagnosis not present

## 2017-09-20 DIAGNOSIS — M25572 Pain in left ankle and joints of left foot: Secondary | ICD-10-CM | POA: Diagnosis not present

## 2017-09-25 ENCOUNTER — Telehealth: Payer: Self-pay | Admitting: Neurology

## 2017-09-25 NOTE — Telephone Encounter (Signed)
We have attempted to call the patient 2 times to schedule sleep study. Patient has been unavailable at the phone numbers we have on file and has not returned our calls. At this point we will send a letter asking pt to please contact the sleep lab to schedule their sleep study. If patient calls back we will schedule them for their sleep study. ° °

## 2017-10-05 DIAGNOSIS — H18603 Keratoconus, unspecified, bilateral: Secondary | ICD-10-CM | POA: Diagnosis not present

## 2017-10-05 DIAGNOSIS — H04123 Dry eye syndrome of bilateral lacrimal glands: Secondary | ICD-10-CM | POA: Diagnosis not present

## 2017-10-05 DIAGNOSIS — H16103 Unspecified superficial keratitis, bilateral: Secondary | ICD-10-CM | POA: Diagnosis not present

## 2017-11-28 DIAGNOSIS — I219 Acute myocardial infarction, unspecified: Secondary | ICD-10-CM

## 2017-11-28 HISTORY — DX: Acute myocardial infarction, unspecified: I21.9

## 2017-11-29 DIAGNOSIS — E782 Mixed hyperlipidemia: Secondary | ICD-10-CM | POA: Diagnosis not present

## 2017-11-29 DIAGNOSIS — M791 Myalgia, unspecified site: Secondary | ICD-10-CM | POA: Diagnosis not present

## 2017-11-29 DIAGNOSIS — I519 Heart disease, unspecified: Secondary | ICD-10-CM | POA: Diagnosis not present

## 2017-12-04 ENCOUNTER — Other Ambulatory Visit: Payer: Self-pay

## 2017-12-04 ENCOUNTER — Encounter (HOSPITAL_COMMUNITY): Payer: Self-pay | Admitting: *Deleted

## 2017-12-04 ENCOUNTER — Inpatient Hospital Stay (HOSPITAL_COMMUNITY)
Admission: EM | Admit: 2017-12-04 | Discharge: 2017-12-06 | DRG: 247 | Disposition: A | Discharge status: Home / Self Care | Payer: BLUE CROSS/BLUE SHIELD | Attending: Cardiology | Admitting: Cardiology

## 2017-12-04 ENCOUNTER — Encounter (HOSPITAL_COMMUNITY)
Admission: EM | Disposition: A | Discharge status: Home / Self Care | Payer: Self-pay | Source: Home / Self Care | Attending: Cardiology

## 2017-12-04 ENCOUNTER — Emergency Department (HOSPITAL_COMMUNITY): Payer: BLUE CROSS/BLUE SHIELD

## 2017-12-04 DIAGNOSIS — Z79890 Hormone replacement therapy (postmenopausal): Secondary | ICD-10-CM | POA: Diagnosis not present

## 2017-12-04 DIAGNOSIS — G4733 Obstructive sleep apnea (adult) (pediatric): Secondary | ICD-10-CM | POA: Diagnosis present

## 2017-12-04 DIAGNOSIS — R001 Bradycardia, unspecified: Secondary | ICD-10-CM | POA: Diagnosis not present

## 2017-12-04 DIAGNOSIS — Z955 Presence of coronary angioplasty implant and graft: Secondary | ICD-10-CM | POA: Diagnosis not present

## 2017-12-04 DIAGNOSIS — Z9119 Patient's noncompliance with other medical treatment and regimen: Secondary | ICD-10-CM | POA: Diagnosis not present

## 2017-12-04 DIAGNOSIS — I499 Cardiac arrhythmia, unspecified: Secondary | ICD-10-CM | POA: Diagnosis not present

## 2017-12-04 DIAGNOSIS — Z79899 Other long term (current) drug therapy: Secondary | ICD-10-CM | POA: Diagnosis not present

## 2017-12-04 DIAGNOSIS — Z8249 Family history of ischemic heart disease and other diseases of the circulatory system: Secondary | ICD-10-CM | POA: Diagnosis not present

## 2017-12-04 DIAGNOSIS — R079 Chest pain, unspecified: Secondary | ICD-10-CM | POA: Diagnosis not present

## 2017-12-04 DIAGNOSIS — I252 Old myocardial infarction: Secondary | ICD-10-CM

## 2017-12-04 DIAGNOSIS — Z7982 Long term (current) use of aspirin: Secondary | ICD-10-CM | POA: Diagnosis not present

## 2017-12-04 DIAGNOSIS — I2510 Atherosclerotic heart disease of native coronary artery without angina pectoris: Secondary | ICD-10-CM | POA: Diagnosis present

## 2017-12-04 DIAGNOSIS — I213 ST elevation (STEMI) myocardial infarction of unspecified site: Secondary | ICD-10-CM | POA: Diagnosis not present

## 2017-12-04 DIAGNOSIS — G4731 Primary central sleep apnea: Secondary | ICD-10-CM

## 2017-12-04 DIAGNOSIS — I472 Ventricular tachycardia: Secondary | ICD-10-CM | POA: Diagnosis not present

## 2017-12-04 DIAGNOSIS — Z9861 Coronary angioplasty status: Secondary | ICD-10-CM | POA: Diagnosis not present

## 2017-12-04 DIAGNOSIS — I2102 ST elevation (STEMI) myocardial infarction involving left anterior descending coronary artery: Secondary | ICD-10-CM | POA: Diagnosis not present

## 2017-12-04 DIAGNOSIS — Z888 Allergy status to other drugs, medicaments and biological substances status: Secondary | ICD-10-CM | POA: Diagnosis not present

## 2017-12-04 DIAGNOSIS — I10 Essential (primary) hypertension: Secondary | ICD-10-CM | POA: Diagnosis present

## 2017-12-04 DIAGNOSIS — R0602 Shortness of breath: Secondary | ICD-10-CM | POA: Diagnosis not present

## 2017-12-04 DIAGNOSIS — I2101 ST elevation (STEMI) myocardial infarction involving left main coronary artery: Secondary | ICD-10-CM | POA: Diagnosis not present

## 2017-12-04 DIAGNOSIS — K219 Gastro-esophageal reflux disease without esophagitis: Secondary | ICD-10-CM | POA: Diagnosis present

## 2017-12-04 DIAGNOSIS — E669 Obesity, unspecified: Secondary | ICD-10-CM | POA: Diagnosis not present

## 2017-12-04 DIAGNOSIS — R0789 Other chest pain: Secondary | ICD-10-CM | POA: Diagnosis not present

## 2017-12-04 DIAGNOSIS — E785 Hyperlipidemia, unspecified: Secondary | ICD-10-CM | POA: Diagnosis present

## 2017-12-04 HISTORY — PX: LEFT HEART CATH AND CORONARY ANGIOGRAPHY: CATH118249

## 2017-12-04 HISTORY — PX: CORONARY/GRAFT ACUTE MI REVASCULARIZATION: CATH118305

## 2017-12-04 LAB — CBC WITH DIFFERENTIAL/PLATELET
Abs Immature Granulocytes: 0 10*3/uL (ref 0.0–0.1)
Basophils Absolute: 0.1 10*3/uL (ref 0.0–0.1)
Basophils Relative: 1 %
Eosinophils Absolute: 0.2 10*3/uL (ref 0.0–0.7)
Eosinophils Relative: 3 %
HCT: 41.6 % (ref 39.0–52.0)
Hemoglobin: 13.8 g/dL (ref 13.0–17.0)
Immature Granulocytes: 1 %
Lymphocytes Relative: 23 %
Lymphs Abs: 1.8 10*3/uL (ref 0.7–4.0)
MCH: 30.9 pg (ref 26.0–34.0)
MCHC: 33.2 g/dL (ref 30.0–36.0)
MCV: 93.3 fL (ref 78.0–100.0)
Monocytes Absolute: 0.6 10*3/uL (ref 0.1–1.0)
Monocytes Relative: 8 %
Neutro Abs: 5.2 10*3/uL (ref 1.7–7.7)
Neutrophils Relative %: 66 %
Platelets: 214 10*3/uL (ref 150–400)
RBC: 4.46 MIL/uL (ref 4.22–5.81)
RDW: 12.8 % (ref 11.5–15.5)
WBC: 7.9 10*3/uL (ref 4.0–10.5)

## 2017-12-04 LAB — BASIC METABOLIC PANEL
Anion gap: 11 (ref 5–15)
BUN: 16 mg/dL (ref 8–23)
CO2: 23 mmol/L (ref 22–32)
Calcium: 9 mg/dL (ref 8.9–10.3)
Chloride: 102 mmol/L (ref 98–111)
Creatinine, Ser: 1.11 mg/dL (ref 0.61–1.24)
GFR calc Af Amer: >60 mL/min (ref >60)
GFR calc non Af Amer: >60 mL/min (ref >60)
Glucose, Bld: 96 mg/dL (ref 70–99)
Potassium: 3.6 mmol/L (ref 3.5–5.1)
Sodium: 136 mmol/L (ref 135–145)

## 2017-12-04 LAB — TROPONIN I: Troponin I: 0.13 ng/mL (ref <0.03)

## 2017-12-04 LAB — LIPID PANEL
Cholesterol: 183 mg/dL (ref 0–200)
HDL: 31 mg/dL — ABNORMAL LOW (ref >40)
LDL Cholesterol: 109 mg/dL — ABNORMAL HIGH (ref 0–99)
Total CHOL/HDL Ratio: 5.9 RATIO
Triglycerides: 215 mg/dL — ABNORMAL HIGH (ref <150)
VLDL: 43 mg/dL — ABNORMAL HIGH (ref 0–40)

## 2017-12-04 LAB — CBC
HCT: 41.5 % (ref 39.0–52.0)
Hemoglobin: 13.9 g/dL (ref 13.0–17.0)
MCH: 31.2 pg (ref 26.0–34.0)
MCHC: 33.5 g/dL (ref 30.0–36.0)
MCV: 93.3 fL (ref 78.0–100.0)
Platelets: 223 10*3/uL (ref 150–400)
RBC: 4.45 MIL/uL (ref 4.22–5.81)
RDW: 12.7 % (ref 11.5–15.5)
WBC: 6.3 10*3/uL (ref 4.0–10.5)

## 2017-12-04 LAB — PROTIME-INR
INR: 1.1
Prothrombin Time: 14.1 seconds (ref 11.4–15.2)

## 2017-12-04 LAB — I-STAT TROPONIN, ED: Troponin i, poc: 0.14 ng/mL (ref 0.00–0.08)

## 2017-12-04 LAB — APTT: aPTT: 186 seconds (ref 24–36)

## 2017-12-04 SURGERY — CORONARY/GRAFT ACUTE MI REVASCULARIZATION
Anesthesia: LOCAL

## 2017-12-04 MED ORDER — EZETIMIBE 10 MG PO TABS
10.0000 mg | ORAL_TABLET | Freq: Every day | ORAL | Status: DC
  Administered 2017-12-05 – 2017-12-06 (×2): 10 mg via ORAL
  Filled 2017-12-04 (×2): qty 1

## 2017-12-04 MED ORDER — TICAGRELOR 90 MG PO TABS
ORAL_TABLET | ORAL | Status: DC | PRN
  Administered 2017-12-04: 180 mg via ORAL

## 2017-12-04 MED ORDER — LIDOCAINE HCL (PF) 1 % IJ SOLN
INTRAMUSCULAR | Status: AC
  Filled 2017-12-04: qty 30

## 2017-12-04 MED ORDER — TIROFIBAN HCL IN NACL 5-0.9 MG/100ML-% IV SOLN
INTRAVENOUS | Status: AC
  Filled 2017-12-04: qty 100

## 2017-12-04 MED ORDER — HEPARIN SODIUM (PORCINE) 1000 UNIT/ML IJ SOLN
1000.0000 [IU] | Freq: Once | INTRAMUSCULAR | Status: DC
  Filled 2017-12-04: qty 1

## 2017-12-04 MED ORDER — TIROFIBAN (AGGRASTAT) BOLUS VIA INFUSION
INTRAVENOUS | Status: DC | PRN
  Administered 2017-12-04: 2110 ug via INTRAVENOUS

## 2017-12-04 MED ORDER — SODIUM CHLORIDE 0.9 % IV SOLN
INTRAVENOUS | Status: DC | PRN
  Administered 2017-12-04: 10 mL/h via INTRAVENOUS

## 2017-12-04 MED ORDER — NITROGLYCERIN 1 MG/10 ML FOR IR/CATH LAB
INTRA_ARTERIAL | Status: AC
  Filled 2017-12-04: qty 10

## 2017-12-04 MED ORDER — BIVALIRUDIN BOLUS VIA INFUSION - CUPID
INTRAVENOUS | Status: DC | PRN
  Administered 2017-12-04: 63.3 mg via INTRAVENOUS

## 2017-12-04 MED ORDER — HEPARIN (PORCINE) IN NACL 1000-0.9 UT/500ML-% IV SOLN
INTRAVENOUS | Status: AC
  Filled 2017-12-04: qty 1000

## 2017-12-04 MED ORDER — SODIUM CHLORIDE 0.9 % IV SOLN
INTRAVENOUS | Status: AC | PRN
  Administered 2017-12-04: 50 mL/h via INTRAVENOUS

## 2017-12-04 MED ORDER — ROSUVASTATIN CALCIUM 5 MG PO TABS: 5.0000 mg | ORAL_TABLET | Freq: Every day | ORAL | Status: DC

## 2017-12-04 MED ORDER — VERAPAMIL HCL 2.5 MG/ML IV SOLN
INTRAVENOUS | Status: AC
  Filled 2017-12-04: qty 2

## 2017-12-04 MED ORDER — HEPARIN SODIUM (PORCINE) 1000 UNIT/ML IJ SOLN
4000.0000 [IU] | Freq: Once | INTRAMUSCULAR | Status: AC
  Administered 2017-12-04: 4000 [IU] via INTRAVENOUS
  Filled 2017-12-04: qty 4

## 2017-12-04 MED ORDER — HEPARIN (PORCINE) IN NACL 1000-0.9 UT/500ML-% IV SOLN
INTRAVENOUS | Status: DC | PRN
  Administered 2017-12-04: 500 mL

## 2017-12-04 MED ORDER — HEPARIN SODIUM (PORCINE) 5000 UNIT/ML IJ SOLN
5000.0000 [IU] | Freq: Once | INTRAMUSCULAR | Status: DC
  Filled 2017-12-04: qty 1

## 2017-12-04 MED ORDER — SODIUM CHLORIDE 0.9 % IV SOLN
INTRAVENOUS | Status: DC
  Administered 2017-12-05: 01:00:00 via INTRAVENOUS

## 2017-12-04 MED ORDER — MIDAZOLAM HCL 2 MG/2ML IJ SOLN
INTRAMUSCULAR | Status: DC | PRN
  Administered 2017-12-04: 2 mg via INTRAVENOUS

## 2017-12-04 MED ORDER — ASPIRIN 81 MG PO CHEW: 324.0000 mg | CHEWABLE_TABLET | Freq: Once | ORAL | Status: DC

## 2017-12-04 MED ORDER — VERAPAMIL HCL 2.5 MG/ML IV SOLN
INTRAVENOUS | Status: DC | PRN
  Administered 2017-12-04: 10 mL via INTRA_ARTERIAL

## 2017-12-04 MED ORDER — FENTANYL CITRATE (PF) 100 MCG/2ML IJ SOLN
INTRAMUSCULAR | Status: AC
  Filled 2017-12-04: qty 2

## 2017-12-04 MED ORDER — FENTANYL CITRATE (PF) 100 MCG/2ML IJ SOLN
INTRAMUSCULAR | Status: DC | PRN
  Administered 2017-12-04: 25 ug via INTRAVENOUS

## 2017-12-04 MED ORDER — SODIUM CHLORIDE 0.9 % IV SOLN
INTRAVENOUS | Status: DC
  Administered 2017-12-04: 22:00:00 via INTRAVENOUS

## 2017-12-04 MED ORDER — LIDOCAINE HCL (PF) 1 % IJ SOLN
INTRAMUSCULAR | Status: DC | PRN
  Administered 2017-12-04: 2 mL

## 2017-12-04 MED ORDER — NITROGLYCERIN 1 MG/10 ML FOR IR/CATH LAB
INTRA_ARTERIAL | Status: DC | PRN
  Administered 2017-12-04: 200 ug via INTRACORONARY

## 2017-12-04 MED ORDER — LEVOTHYROXINE SODIUM 75 MCG PO TABS
75.0000 ug | ORAL_TABLET | Freq: Every day | ORAL | Status: DC
  Administered 2017-12-05 – 2017-12-06 (×2): 75 ug via ORAL
  Filled 2017-12-04 (×3): qty 1

## 2017-12-04 MED ORDER — BIVALIRUDIN TRIFLUOROACETATE 250 MG IV SOLR
INTRAVENOUS | Status: AC
  Filled 2017-12-04: qty 250

## 2017-12-04 MED ORDER — MIDAZOLAM HCL 2 MG/2ML IJ SOLN
INTRAMUSCULAR | Status: AC
  Filled 2017-12-04: qty 2

## 2017-12-04 SURGICAL SUPPLY — 23 items
BALLN SAPPHIRE 2.0X12 (BALLOONS) ×2
BALLN SAPPHIRE 2.5X12 (BALLOONS) ×2
BALLN SAPPHIRE ~~LOC~~ 3.25X18 (BALLOONS) ×1 IMPLANT
BALLOON SAPPHIRE 2.0X12 (BALLOONS) IMPLANT
BALLOON SAPPHIRE 2.5X12 (BALLOONS) IMPLANT
CATH INFINITI 5FR ANG PIGTAIL (CATHETERS) ×1 IMPLANT
CATH OPTITORQUE TIG 4.0 5F (CATHETERS) ×1 IMPLANT
CATH VISTA GUIDE 6FR XBLAD3.5 (CATHETERS) ×1 IMPLANT
CATH VISTA GUIDE 6FR XBLAD4 (CATHETERS) ×1 IMPLANT
DEVICE RAD COMP TR BAND LRG (VASCULAR PRODUCTS) ×1 IMPLANT
GLIDESHEATH SLEND A-KIT 6F 22G (SHEATH) IMPLANT
GLIDESHEATH SLEND SS 6F .021 (SHEATH) ×1 IMPLANT
GUIDEWIRE INQWIRE 1.5J.035X260 (WIRE) IMPLANT
INQWIRE 1.5J .035X260CM (WIRE) ×2
KIT ENCORE 26 ADVANTAGE (KITS) ×1 IMPLANT
KIT HEART LEFT (KITS) ×2 IMPLANT
PACK CARDIAC CATHETERIZATION (CUSTOM PROCEDURE TRAY) ×2 IMPLANT
STENT RESOLUTE ONYX3.0X38 (Permanent Stent) ×1 IMPLANT
TRANSDUCER W/STOPCOCK (MISCELLANEOUS) ×2 IMPLANT
TUBING CIL FLEX 10 FLL-RA (TUBING) ×2 IMPLANT
WIRE HI TORQ BMW 190CM (WIRE) ×1 IMPLANT
WIRE HI TORQ VERSACORE-J 145CM (WIRE) ×1 IMPLANT
WIRE PT2 MS 185 (WIRE) ×1 IMPLANT

## 2017-12-04 NOTE — ED Provider Notes (Signed)
[facility] DEPARTMENT Provider Note   CSN: [id number] Arrival date & time: 12/04/17  2120     History   Chief Complaint Chief Complaint  Patient presents with  . Chest Pain  . Code STEMI    HPI Michael Terrell is a 62 y.o. male.  HPI   62 year old male with PMH significant for CAD status post stent in 2011, HTN, HLD who presents with acute chest pain.  Patient states that 3 hours prior to arrival he was walking his dog when he had acute onset of substernal chest pain.  Chest pain described as 8/10 severity, pressure-like, nonradiating, made better with nitro, worse with exertion.  Patient states chest pain feels similar to prior heart attack.  Patient went home and took 2 nitro with mild relief in pain.  Took 1 baby aspirin.  Due to persistent symptoms patient called EMS who advised for more baby aspirin and administered to more nitro.  Patient denies recent traumas or falls or infections.  Endorses nausea, diaphoresis and mild shortness of breath.  Past Medical History:  Diagnosis Date  . Acute anterior wall MI ([place]) 10/2009  . CAD (coronary artery disease)    BMS to LAD (2011)  . Chest pain   . Dyslipidemia   . Hyperlipidemia   . Hypertension   . Sinus pause   . Snoring     Patient Active Problem List   Diagnosis Date Noted  . STEMI involving left anterior descending coronary artery ([place]) 12/05/2017  . Acute ST elevation myocardial infarction (STEMI) ([place]) 12/04/2017  . ST elevation myocardial infarction (STEMI) ([place])   . 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 OSA (obstructive sleep apnea) 01/31/2014  . PLMD (periodic limb movement disorder) 01/31/2014  . Second degree AV block, Mobitz type II 07/30/2013  . Sinus pause 07/30/2013  . Snoring 07/30/2013  . Chest pain 07/08/2013  . CAD (coronary artery disease) 06/05/2013  . Dyslipidemia 06/05/2013  . HTN (hypertension) 06/05/2013    Past Surgical History:  Procedure Laterality Date  .  CARDIAC CATHETERIZATION  01/29/2010   patent LAD stent (3.5x78mm Vision - Dr. Corky Downs 11/03/2009); 50% prox PLA stenosis, 50% distal RCA stenosis (Dr. Norlene Duel)  . CORONARY ANGIOPLASTY WITH STENT PLACEMENT  11/13/2009   3.5x2mm Vision BMS to LAD (Dr. Corky Downs)  . KNEE SURGERY    . NM MYOCAR PERF WALL MOTION  02/2010   bruce myoview - normal pattern of perfusion, low risk, no ischemia demonstrated, low risk  . SHOULDER SURGERY    . TRANSTHORACIC ECHOCARDIOGRAM  02/2012   EF 17-51%, grade 1 diastolic dysfunction; mild MR; LA mildly dilated; upper limit borderline LA enlargement        Home Medications    Prior to Admission medications   Medication Sig Start Date End Date Taking? Authorizing Provider  amLODipine (NORVASC) 2.5 MG tablet Take 2.5 mg by mouth daily.    [provider]  aspirin 81 MG tablet Take 81 mg by mouth daily.     [provider]  cetirizine (ZYRTEC) 10 MG tablet Take 10 mg by mouth daily as needed for allergies.     [provider]  Cholecalciferol (VITAMIN D-3 PO) Take 2,500 Units by mouth daily.    [provider]  ezetimibe (ZETIA) 10 MG tablet Take 10 mg by mouth daily.    [provider]  levothyroxine (SYNTHROID, LEVOTHROID) 75 MCG tablet Take 75 mcg by mouth daily before breakfast.    [provider]  losartan (COZAAR) 100 MG tablet Take 100 mg by mouth daily.    [provider]  montelukast (SINGULAIR) 10 MG tablet Take 10 mg by mouth at bedtime.    [provider]  nitroGLYCERIN (NITROSTAT) 0.4 MG SL tablet Place 1 tablet (0.4 mg total) under the tongue every 5 (five) minutes as needed for chest pain. 06/05/13   Hilty, Nadean Corwin, MD  pantoprazole (PROTONIX) 20 MG tablet Take 20 mg by mouth daily.    [provider]  Polyvinyl Alcohol-Povidone (REFRESH OP) Place 1 drop into both eyes 3 (three) times daily as needed (for dry eyes).     [provider]  rosuvastatin (CRESTOR) 5  MG tablet Take 5 mg by mouth daily.    [provider]    Family History Family History  Problem Relation Age of Onset  . CAD Father        MI in his 101s    Social History Social History   Tobacco Use  . Smoking status: Never Smoker  . Smokeless tobacco: Never Used  Substance Use Topics  . Alcohol use: No    Alcohol/week: 0.0 standard drinks  . Drug use: No     Allergies   Metoprolol and Lisinopril   Review of Systems Review of Systems  Constitutional: Negative for chills and fever.  HENT: Negative for ear pain and sore throat.   Eyes: Negative for pain and visual disturbance.  Respiratory: Negative for cough and shortness of breath.   Cardiovascular: Positive for chest pain. Negative for palpitations and leg swelling.  Gastrointestinal: Negative for abdominal pain and vomiting.  Genitourinary: Negative for dysuria and hematuria.  Musculoskeletal: Negative for arthralgias and back pain.  Skin: Negative for color change and rash.  Neurological: Negative for seizures and syncope.  All other systems reviewed and are negative.    Physical Exam Updated Vital Signs BP 124/81   Pulse (!) 0   Temp 97.6 F (36.4 C)   Resp (!) 0   Ht 5\' 9"  (1.753 m)   Wt 84.4 kg   SpO2 (!) 0%   BMI 27.47 kg/m   Physical Exam  Constitutional: He is oriented to person, place, and time. He appears well-developed and well-nourished.  Non-toxic appearance. He does not appear ill. He appears distressed.  Diaphoretic  HENT:  Head: Normocephalic and atraumatic.  Eyes: Conjunctivae are normal.  Neck: Neck supple.  Cardiovascular: Regular rhythm. Tachycardia present.  No murmur heard. Pulmonary/Chest: Effort normal and breath sounds normal. No respiratory distress.  Abdominal: Soft. There is no tenderness.  Musculoskeletal: He exhibits no edema.  Neurological: He is alert and oriented to person, place, and time.  Skin: Skin is warm and dry. Capillary refill takes less than 2  seconds.  Psychiatric: He has a normal mood and affect.  Nursing note and vitals reviewed.    ED Treatments / Results  Labs (all labs ordered are listed, but only abnormal results are displayed) Labs Reviewed  APTT - Abnormal; Notable for the following components:      Result Value   aPTT 186 (*)    All other components within normal limits  COMPREHENSIVE METABOLIC PANEL - Abnormal; Notable for the following components:   Creatinine, Ser 2.79 (*)    GFR calc non Af Amer 23 (*)    GFR calc Af Amer 26 (*)    All other components within normal limits  TROPONIN I - Abnormal; Notable for the following components:   Troponin  I 0.13 (*)    All other components within normal limits  LIPID PANEL - Abnormal; Notable for the following components:   Triglycerides 215 (*)    HDL 31 (*)    VLDL 43 (*)    LDL Cholesterol 109 (*)    All other components within normal limits  I-STAT TROPONIN, ED - Abnormal; Notable for the following components:   Troponin i, poc 0.14 (*)    All other components within normal limits  POCT I-STAT, CHEM 8 - Abnormal; Notable for the following components:   Glucose, Bld 102 (*)    Hemoglobin 12.6 (*)    HCT 37.0 (*)    All other components within normal limits  BASIC METABOLIC PANEL  CBC  PROTIME-INR  CBC WITH DIFFERENTIAL/PLATELET  POCT ACTIVATED CLOTTING TIME    EKG EKG Interpretation  Date/Time:  Monday December 04 2017 21:26:13 EDT Ventricular Rate:  69 PR Interval:    QRS Duration: 84 QT Interval:  389 QTC Calculation: 417 R Axis:   -4 Text Interpretation:  Sinus rhythm `marked st dep II, III, AVF, V4-V6,  elev avr/v1 c/w acute ischemia/STEMI Reconfirmed by Lajean Saver [phone]) on 12/04/2017 10:10:57 PM   Radiology Dg Chest Port 1 View  Result Date: 12/04/2017 CLINICAL DATA:  Chest pain starting at 7 p.m. today. Weakness, dizziness, and shortness of breath. EXAM: PORTABLE CHEST 1 VIEW COMPARISON:  07/08/2013 FINDINGS: Upper normal heart size  for AP projection and low lung volumes. Lungs appear clear, without signs of edema. No pleural effusion. Right distal clavicular deformity from an old healed fracture. IMPRESSION: 1.  No active cardiopulmonary disease is radiographically apparent. 2. Old right distal clavicular fracture. Electronically Signed   By: Van Clines M.D.   On: 12/04/2017 22:35    Procedures Procedures (including critical care time)  Medications Ordered in ED Medications  0.9 %  sodium chloride infusion (has no administration in time range)  aspirin chewable tablet 324 mg ( Oral MAR Hold 12/04/17 2240)  0.9 %  sodium chloride infusion ( Intravenous New Bag/Given 12/04/17 2213)  Heparin (Porcine) in NaCl 1000-0.9 UT/500ML-% SOLN (500 mLs  Given 12/04/17 2246)  Heparin (Porcine) in NaCl 1000-0.9 UT/500ML-% SOLN (500 mLs  Given 12/04/17 2246)  0.9 %  sodium chloride infusion (50 mL/hr Intravenous New Bag/Given 12/04/17 2249)  0.9 %  sodium chloride infusion ( Intravenous Stopped 12/05/17 0023)  fentaNYL (SUBLIMAZE) injection (25 mcg Intravenous Given 12/04/17 2254)  midazolam (VERSED) injection (2 mg Intravenous Given 12/04/17 2255)  lidocaine (PF) (XYLOCAINE) 1 % injection (2 mLs  Given 12/04/17 2255)  Radial Cocktail/Verapamil only (10 mLs Intra-arterial Given 12/04/17 2256)  ezetimibe (ZETIA) tablet 10 mg (has no administration in time range)  levothyroxine (SYNTHROID, LEVOTHROID) tablet 75 mcg (has no administration in time range)  rosuvastatin (CRESTOR) tablet 5 mg (has no administration in time range)  ticagrelor (BRILINTA) tablet (180 mg Oral Given 12/04/17 2307)  bivalirudin (ANGIOMAX) BOLUS via infusion (63.3 mg Intravenous Given 12/04/17 2307)  nitroGLYCERIN 1 mg/10 mL (100 mcg/mL) - IR/CATH LAB (200 mcg Intracoronary Given 12/04/17 2335)  tirofiban (AGGRASTAT) bolus via infusion (2,110 mcg Intravenous Given 12/04/17 2341)  tirofiban (AGGRASTAT) infusion 50 mcg/mL 100 mL ( Intravenous Stopped 12/05/17 0013)    iohexol (OMNIPAQUE) 350 MG/ML injection (200 mLs Intra-arterial Given 12/05/17 0010)  heparin injection 4,000 Units (4,000 Units Intravenous Given 12/04/17 2213)     Initial Impression / Assessment and Plan / ED Course  I have reviewed the triage vital signs and the  nursing notes.  Pertinent labs & imaging results that were available during my care of the patient were reviewed by me and considered in my medical decision making (see chart for details).     62 year old male with PMH significant for CAD status post stent in 2011, HTN, HLD who presents with acute chest pain.  History as above.  DDX includes ACS versus pandemic.  Patient tachycardic and normotensive on initial exam.  EKG revealed ST depressions and lead II, III, aVF with ST elevations in aVR and V1.  Patient with lateral depressions as well.  I-STAT troponin elevated 0.14.  Patient activated as STEMI, given heparin and cardiology was consulted.  Cardiology to take patient to Cath Lab.  Patient seen in conjunction with my attending Dr. Ashok Cordia who agrees with plan and disposition.  Final Clinical Impressions(s) / ED Diagnoses   Final diagnoses:  ST elevation myocardial infarction (STEMI), unspecified artery [facility])    ED Discharge Orders         Ordered    AMB Referral to Cardiac Rehabilitation - Phase II     12/05/17 0030           Keenan Bachelor, MD 12/05/17 Sharl Ma    Lajean Saver, MD 12/05/17 1242

## 2017-12-04 NOTE — ED Notes (Addendum)
Dr. Paticia Stack (cardiologist) explained procedure and plan of care to pt. and family at bedside , IV sites intact , pt.'s bilateral groin hair shaved , waiting call from cath lab .

## 2017-12-04 NOTE — ED Provider Notes (Addendum)
2158 ecg shown to me - code stemi activated.  Upon seeing ecg, code stemi was activated by me.  Continuous pulse ox and monitor. o2 [place]. Stat labs. Pcxr.   2208 discussed with Dr Claiborne Billings, st elev avr and v1, with st dep II, III, avf, V5 and v6 - he will be in to cath lab.  Cp improved from prior. Hep. Stat cath lab.  Results for orders placed or performed during the hospital encounter of 12/04/17  MRSA PCR Screening  Result Value Ref Range   MRSA by PCR NEGATIVE NEGATIVE  Basic metabolic panel  Result Value Ref Range   Sodium 136 135 - 145 mmol/L   Potassium 3.6 3.5 - 5.1 mmol/L   Chloride 102 98 - 111 mmol/L   CO2 23 22 - 32 mmol/L   Glucose, Bld 96 70 - 99 mg/dL   BUN 16 8 - 23 mg/dL   Creatinine, Ser 1.11 0.61 - 1.24 mg/dL   Calcium 9.0 8.9 - 10.3 mg/dL   GFR calc non Af Amer >60 >60 mL/min   GFR calc Af Amer >60 >60 mL/min   Anion gap 11 5 - 15  CBC  Result Value Ref Range   WBC 6.3 4.0 - 10.5 K/uL   RBC 4.45 4.22 - 5.81 MIL/uL   Hemoglobin 13.9 13.0 - 17.0 g/dL   HCT 41.5 39.0 - 52.0 %   MCV 93.3 78.0 - 100.0 fL   MCH 31.2 26.0 - 34.0 pg   MCHC 33.5 30.0 - 36.0 g/dL   RDW 12.7 11.5 - 15.5 %   Platelets 223 150 - 400 K/uL  Protime-INR  Result Value Ref Range   Prothrombin Time 14.1 11.4 - 15.2 seconds   INR 1.10   APTT  Result Value Ref Range   aPTT 186 (HH) 24 - 36 seconds  Comprehensive metabolic panel  Result Value Ref Range   Sodium 138 135 - 145 mmol/L   Potassium 4.4 3.5 - 5.1 mmol/L   Chloride 105 98 - 111 mmol/L   CO2 22 22 - 32 mmol/L   Glucose, Bld 94 70 - 99 mg/dL   BUN 21 8 - 23 mg/dL   Creatinine, Ser 2.79 (H) 0.61 - 1.24 mg/dL   Calcium 8.9 8.9 - 10.3 mg/dL   Total Protein 6.6 6.5 - 8.1 g/dL   Albumin 3.7 3.5 - 5.0 g/dL   AST 26 15 - 41 U/L   ALT 23 0 - 44 U/L   Alkaline Phosphatase 75 38 - 126 U/L   Total Bilirubin 0.7 0.3 - 1.2 mg/dL   GFR calc non Af Amer 23 (L) >60 mL/min   GFR calc Af Amer 26 (L) >60 mL/min   Anion gap 11 5 - 15   Troponin I  Result Value Ref Range   Troponin I 0.13 (HH) <0.03 ng/mL  Lipid panel  Result Value Ref Range   Cholesterol 183 0 - 200 mg/dL   Triglycerides 215 (H) <150 mg/dL   HDL 31 (L) >40 mg/dL   Total CHOL/HDL Ratio 5.9 RATIO   VLDL 43 (H) 0 - 40 mg/dL   LDL Cholesterol 109 (H) 0 - 99 mg/dL  CBC with Differential/Platelet  Result Value Ref Range   WBC 7.9 4.0 - 10.5 K/uL   RBC 4.46 4.22 - 5.81 MIL/uL   Hemoglobin 13.8 13.0 - 17.0 g/dL   HCT 41.6 39.0 - 52.0 %   MCV 93.3 78.0 - 100.0 fL   MCH 30.9 26.0 - 34.0  pg   MCHC 33.2 30.0 - 36.0 g/dL   RDW 12.8 11.5 - 15.5 %   Platelets 214 150 - 400 K/uL   Neutrophils Relative % 66 %   Neutro Abs 5.2 1.7 - 7.7 K/uL   Lymphocytes Relative 23 %   Lymphs Abs 1.8 0.7 - 4.0 K/uL   Monocytes Relative 8 %   Monocytes Absolute 0.6 0.1 - 1.0 K/uL   Eosinophils Relative 3 %   Eosinophils Absolute 0.2 0.0 - 0.7 K/uL   Basophils Relative 1 %   Basophils Absolute 0.1 0.0 - 0.1 K/uL   Immature Granulocytes 1 %   Abs Immature Granulocytes 0.0 0.0 - 0.1 K/uL  Basic metabolic panel  Result Value Ref Range   Sodium 137 135 - 145 mmol/L   Potassium 3.8 3.5 - 5.1 mmol/L   Chloride 109 98 - 111 mmol/L   CO2 20 (L) 22 - 32 mmol/L   Glucose, Bld 89 70 - 99 mg/dL   BUN 12 8 - 23 mg/dL   Creatinine, Ser 0.97 0.61 - 1.24 mg/dL   Calcium 8.1 (L) 8.9 - 10.3 mg/dL   GFR calc non Af Amer >60 >60 mL/min   GFR calc Af Amer >60 >60 mL/min   Anion gap 8 5 - 15  CBC  Result Value Ref Range   WBC 8.6 4.0 - 10.5 K/uL   RBC 4.57 4.22 - 5.81 MIL/uL   Hemoglobin 14.0 13.0 - 17.0 g/dL   HCT 42.4 39.0 - 52.0 %   MCV 92.8 80.0 - 100.0 fL   MCH 30.6 26.0 - 34.0 pg   MCHC 33.0 30.0 - 36.0 g/dL   RDW 12.8 11.5 - 15.5 %   Platelets 212 150 - 400 K/uL  HIV antibody (Routine Testing)  Result Value Ref Range   HIV Screen 4th Generation wRfx Non Reactive Non Reactive  Troponin I  Result Value Ref Range   Troponin I 38.55 (HH) <0.03 ng/mL  Troponin I   Result Value Ref Range   Troponin I >65.00 (HH) <0.03 ng/mL  Troponin I  Result Value Ref Range   Troponin I >65.00 (HH) <0.03 ng/mL  Protime-INR  Result Value Ref Range   Prothrombin Time 16.5 (H) 11.4 - 15.2 seconds   INR 0.99   Basic metabolic panel  Result Value Ref Range   Sodium 134 (L) 135 - 145 mmol/L   Potassium 4.1 3.5 - 5.1 mmol/L   Chloride 106 98 - 111 mmol/L   CO2 20 (L) 22 - 32 mmol/L   Glucose, Bld 95 70 - 99 mg/dL   BUN 11 8 - 23 mg/dL   Creatinine, Ser 0.98 0.61 - 1.24 mg/dL   Calcium 8.7 (L) 8.9 - 10.3 mg/dL   GFR calc non Af Amer >60 >60 mL/min   GFR calc Af Amer >60 >60 mL/min   Anion gap 8 5 - 15  I-stat troponin, ED  Result Value Ref Range   Troponin i, poc 0.14 (HH) 0.00 - 0.08 ng/mL   Comment NOTIFIED PHYSICIAN    Comment 3          POCT Activated clotting time  Result Value Ref Range   Activated Clotting Time 549 seconds  I-STAT, chem 8  Result Value Ref Range   Sodium 136 135 - 145 mmol/L   Potassium 3.7 3.5 - 5.1 mmol/L   Chloride 102 98 - 111 mmol/L   BUN 15 8 - 23 mg/dL   Creatinine, Ser 0.90 0.61 -  1.24 mg/dL   Glucose, Bld 102 (H) 70 - 99 mg/dL   Calcium, Ion 1.15 1.15 - 1.40 mmol/L   TCO2 22 22 - 32 mmol/L   Hemoglobin 12.6 (L) 13.0 - 17.0 g/dL   HCT 37.0 (L) 39.0 - 52.0 %  ECHOCARDIOGRAM COMPLETE  Result Value Ref Range   Weight 2,976 oz   Height 69 in   BP 135/88 mmHg   Dg Chest Port 1 View  Result Date: 12/04/2017 CLINICAL DATA:  Chest pain starting at 7 p.m. today. Weakness, dizziness, and shortness of breath. EXAM: PORTABLE CHEST 1 VIEW COMPARISON:  07/08/2013 FINDINGS: Upper normal heart size for AP projection and low lung volumes. Lungs appear clear, without signs of edema. No pleural effusion. Right distal clavicular deformity from an old healed fracture. IMPRESSION: 1.  No active cardiopulmonary disease is radiographically apparent. 2. Old right distal clavicular fracture. Electronically Signed   By: Van Clines M.D.    On: 12/04/2017 22:35   CRITICAL CARE  RE: ACS/STEMI, emergent cath lab activation Performed by: Mirna Mires Total critical care time: 35 minutes Critical care time was exclusive of separately billable procedures and treating other patients. Critical care was necessary to treat or prevent imminent or life-threatening deterioration. Critical care was time spent personally by me on the following activities: development of treatment plan with patient and/or surrogate as well as nursing, discussions with consultants, evaluation of patient's response to treatment, examination of patient, obtaining history from patient or surrogate, ordering and performing treatments and interventions, ordering and review of laboratory studies, ordering and review of radiographic studies, pulse oximetry and re-evaluation of patient's condition.      Lajean Saver, MD 12/04/17 2209    Lajean Saver, MD 12/31/17 [phone]

## 2017-12-04 NOTE — H&P (Addendum)
Cardiology Admission History and Physical:   Patient ID: RAD GRAMLING; MRN: [id number]; DOB: [birth year]   Admission date: 12/04/2017  Primary Care Provider:  Fanny Bien, MD Primary Cardiologist:  Cardiologist at [facility]  Chief Complaint:  Acute onset of chest pain  History of Present Illness:   Michael EGNOR is a 62 y.o. male with a history of CAD (s/p MI and BMS to LAD in 2011), hypertension, hyperlipidemia, GERD, obesity, and sleep apnea (not compliant with BIPAP) who presents to the hospital with acute onset of chest pain at 7pm today while walking his dog. It started as pressure in the center of the chest, no radiation and progressively got worse. In the ED he is complaining of 8/10 chest pain. Denies any nausea or vomiting. He denies shortness of breath, orthopnea or PND. There is no lower extremity edema.   In the ED the ECG revealed more prominent ST elevations in aVR and V1 with ST depressions in the inferolateral leads. He received ASA and 4000 units of IV heparin. The STEMI team was activated. Plan is for diagnostic angiography and possible primary PCI.    Past Medical History:  Diagnosis Date  . Acute anterior wall MI ([place]) 10/2009  . CAD (coronary artery disease)    BMS to LAD (2011)  . Chest pain   . Dyslipidemia   . Hyperlipidemia   . Hypertension   . Sinus pause   . Snoring     Past Surgical History:  Procedure Laterality Date  . CARDIAC CATHETERIZATION  01/29/2010   patent LAD stent (3.5x15mm Vision - Dr. Corky Downs 11/03/2009); 50% prox PLA stenosis, 50% distal RCA stenosis (Dr. Norlene Duel)  . CORONARY ANGIOPLASTY WITH STENT PLACEMENT  11/13/2009   3.5x93mm Vision BMS to LAD (Dr. Corky Downs)  . KNEE SURGERY    . NM MYOCAR PERF WALL MOTION  02/2010   bruce myoview - normal pattern of perfusion, low risk, no ischemia demonstrated, low risk  . SHOULDER SURGERY    . TRANSTHORACIC ECHOCARDIOGRAM  02/2012   EF 97-67%, grade 1 diastolic dysfunction; mild  MR; LA mildly dilated; upper limit borderline LA enlargement     Medications Prior to Admission: Prior to Admission medications   Medication Sig Start Date End Date Taking? Authorizing Provider  amLODipine (NORVASC) 2.5 MG tablet Take 2.5 mg by mouth daily.    [provider]  aspirin 81 MG tablet Take 81 mg by mouth daily.     [provider]  cetirizine (ZYRTEC) 10 MG tablet Take 10 mg by mouth daily as needed for allergies.     [provider]  Cholecalciferol (VITAMIN D-3 PO) Take 2,500 Units by mouth daily.    [provider]  ezetimibe (ZETIA) 10 MG tablet Take 10 mg by mouth daily.    [provider]  levothyroxine (SYNTHROID, LEVOTHROID) 75 MCG tablet Take 75 mcg by mouth daily before breakfast.    [provider]  losartan (COZAAR) 100 MG tablet Take 100 mg by mouth daily.    [provider]  montelukast (SINGULAIR) 10 MG tablet Take 10 mg by mouth at bedtime.    [provider]  nitroGLYCERIN (NITROSTAT) 0.4 MG SL tablet Place 1 tablet (0.4 mg total) under the tongue every 5 (five) minutes as needed for chest pain. 06/05/13   Hilty, Nadean Corwin, MD  pantoprazole (PROTONIX) 20 MG tablet Take 20 mg by mouth daily.    [provider]  Polyvinyl Alcohol-Povidone (REFRESH OP) Place  1 drop into both eyes 3 (three) times daily as needed (for dry eyes).     [provider]  rosuvastatin (CRESTOR) 5 MG tablet Take 5 mg by mouth daily.    [provider]     Allergies:    Allergies  Allergen Reactions  . Metoprolol   . Lisinopril Rash    Social History:   Social History   Socioeconomic History  . Marital status: Married    Spouse name: Not on file  . Number of children: 2  . Years of education: Not on file  . Highest education level: Not on file  Occupational History  . 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 Financial  resource strain: Not on file  . Food insecurity:    Worry: Not on file    Inability: Not on file  . Transportation needs:    Medical: Not on file    Non-medical: Not on file  Tobacco Use  . Smoking status: Never Smoker  . Smokeless tobacco: Never Used  Substance and Sexual Activity  . Alcohol use: No    Alcohol/week: 0.0 standard drinks  . Drug use: No  . Sexual activity: Not on file  Lifestyle  . Physical activity:    Days per week: Not on file    Minutes per session: Not on file  . Stress: Not on file  Relationships  . Social connections:    Talks on phone: Not on file    Gets together: Not on file    Attends religious service: Not on file    Active member of club or organization: Not on file    Attends meetings of clubs or organizations: Not on file    Relationship status: Not on file  . Intimate partner violence:    Fear of current or ex partner: Not on file    Emotionally abused: Not on file    Physically abused: Not on file    Forced sexual activity: Not on file  Other Topics Concern  . Not on file  Social History Narrative  . Not on file     Family History:   The patient's family history includes CAD in his father.     Review of Systems: [y] = yes, [ ]  = no   . General: Weight gain [ ] ; Weight loss [ ] ; Anorexia [ ] ; Fatigue [ ] ; Fever [ ] ; Chills [ ] ; Weakness [ ]   . Cardiac: Chest pain/pressure Blue.Reese ]; Resting SOB [ ] ; Exertional SOB [ ] ; Orthopnea [ ] ; Pedal Edema [ ] ; Palpitations [ ] ; Syncope [ ] ; Presyncope [ ] ; Paroxysmal nocturnal dyspnea[ ]   . Pulmonary: Cough [ ] ; Wheezing[ ] ; Hemoptysis[ ] ; Sputum [ ] ; Snoring [ ]   . GI: Vomiting[ ] ; Dysphagia[ ] ; Melena[ ] ; Hematochezia [ ] ; Heartburn[ ] ; Abdominal pain [ ] ; Constipation [ ] ; Diarrhea [ ] ; BRBPR [ ]   . GU: Hematuria[ ] ; Dysuria [ ] ; Nocturia[ ]   . Vascular: Pain in legs with walking [ ] ; Pain in feet with lying flat [ ] ; Non-healing sores [ ] ; Stroke [ ] ; TIA [ ] ; Slurred speech [ ] ;  . Neuro:  Headaches[ ] ; Vertigo[ ] ; Seizures[ ] ; Paresthesias[ ] ;Blurred vision [ ] ; Diplopia [ ] ; Vision changes [ ]   . Ortho/Skin: Arthritis [ ] ; Joint pain [ ] ; Muscle pain [ ] ; Joint swelling [ ] ; Back Pain [ ] ; Rash [ ]   . Psych: Depression[ ] ;  Anxiety[ ]   . Heme: Bleeding problems [ ] ; Clotting disorders [ ] ; Anemia [ ]   . Endocrine: Diabetes [ ] ; Thyroid dysfunction[ ]      Physical Exam/Data:   Vitals:   12/04/17 2145 12/04/17 2200 12/04/17 2215 12/04/17 2230  BP: (!) 124/95 (!) 137/93 105/79 130/85  Pulse: 63 62 74 65  Resp: (!) 21 12 20 18   Temp:      SpO2: 100% 100% 100% 99%  Weight:      Height:       No intake or output data in the 24 hours ending 12/04/17 2243 Filed Weights   12/04/17 2126  Weight: 84.4 kg   Body mass index is 27.47 kg/m.  General:  Well nourished, well developed, c/o 8/10 chest pain HEENT: normal Lymph: no adenopathy Neck: no JVD Endocrine:  No thryomegaly Vascular: No carotid bruits; FA pulses 2+ bilaterally without bruits  Cardiac:  normal S1, S2; RRR; no murmur Lungs:  clear to auscultation bilaterally, no wheezing, rhonchi or rales  Abd: soft, nontender, no hepatomegaly  Ext: no edema Musculoskeletal:  No deformities, BUE and BLE strength normal and equal Skin: warm and dry  Neuro:  CNs 2-12 intact, no focal abnormalities noted Psych:  Normal affect      Relevant CV Studies: Echocardiogram 07/09/2013 Study Conclusions Left ventricle: The cavity size was normal. Systolic function was normal. The estimated ejection fraction was in the range of 50% to 55%. Although no diagnostic regional wall motion abnormality was identified, this possibility cannot be completely excluded on the basis of this study. Doppler parameters are consistent with abnormal left ventricular relaxation (grade 1 diastolic dysfunction).  Myocardial perfusion scan - 07/10/2013 IMPRESSION: Normal perfusion images with no infarct or ischemia. Visually EF appears normal  but reported at 48% Suggest echo or MRI correlation Low risk study  Laboratory Data:  Chemistry Recent Labs  Lab 12/04/17 2127  NA 136  K 3.6  CL 102  CO2 23  GLUCOSE 96  BUN 16  CREATININE 1.11  CALCIUM 9.0  GFRNONAA >60  GFRAA >60  ANIONGAP 11    No results for input(s): PROT, ALBUMIN, AST, ALT, ALKPHOS, BILITOT in the last 168 hours. Hematology Recent Labs  Lab 12/04/17 2127 12/04/17 2221  WBC 6.3 7.9  RBC 4.45 4.46  HGB 13.9 13.8  HCT 41.5 41.6  MCV 93.3 93.3  MCH 31.2 30.9  MCHC 33.5 33.2  RDW 12.7 12.8  PLT 223 214   Cardiac EnzymesNo results for input(s): TROPONINI in the last 168 hours.  Recent Labs  Lab 12/04/17 2133  TROPIPOC 0.14*    BNPNo results for input(s): BNP, PROBNP in the last 168 hours.  DDimer No results for input(s): DDIMER in the last 168 hours.  Radiology/Studies:  Dg Chest Port 1 View  Result Date: 12/04/2017 CLINICAL DATA:  Chest pain starting at 7 p.m. today. Weakness, dizziness, and shortness of breath. EXAM: PORTABLE CHEST 1 VIEW COMPARISON:  07/08/2013 FINDINGS: Upper normal heart size for AP projection and low lung volumes. Lungs appear clear, without signs of edema. No pleural effusion. Right distal clavicular deformity from an old healed fracture. IMPRESSION: 1.  No active cardiopulmonary disease is radiographically apparent. 2. Old right distal clavicular fracture. Electronically Signed   By: Van Clines M.D.   On: 12/04/2017 22:35    Assessment and Plan:   1. ST elevation myocardial infarction  - ASA 81 mg daily. - Continue Rosuvastatin 5mg  daily. Check lipid panel in AM. - Continue to cycle  biomarkers and get serial ECGs - Unfractionated heparin - Echocardiogram to evaluate LV systolic/diastolic function - Plan is for emergent cardiac catheterization   Severity of Illness: The appropriate patient status for this patient is INPATIENT. Inpatient status is judged to be reasonable and necessary in order to provide  the required intensity of service to ensure the patient's safety. The patient's presenting symptoms, physical exam findings, and initial radiographic and laboratory data in the context of their chronic comorbidities is felt to place them at high risk for further clinical deterioration. Furthermore, it is not anticipated that the patient will be medically stable for discharge from the hospital within 2 midnights of admission. The following factors support the patient status of inpatient.   " The patient's presenting symptoms include chest pain. " The physical exam findings include normal cardiac auscultation. " The initial radiographic and laboratory data are worrisome because of elevated Troponin. " The chronic co-morbidities include hypertension and sleep apnea.   * I certify that at the point of admission it is my clinical judgment that the patient will require inpatient hospital care spanning beyond 2 midnights from the point of admission due to high intensity of service, high risk for further deterioration and high frequency of surveillance required.*    For questions or updates, please contact [place] Please consult [email or web address] for contact info under Cardiology/STEMI.    Signed, Meade Maw, MD  12/04/2017 10:43 PM    Patient seen and examined. Agree with assessment and plan.  Her Summerford is a 62 year old patient who is a former patient of Dr. Rollene Fare.  In September 2011 he was found to have prior myocardial infarction and underwent stenting to his proximal LAD with a 3.5 x 22 mm bare-metal vision stent by me.  Repeat catheterization several months later for recurrent chest pain showed a widely patent stent but he had 50% PDA and PLA stenoses.  He had been doing fairly well and had been on low-dose amlodipine, and losartan for hypertension and low-dose rosuvastatin for hyperlipidemia.  Yesterday he experienced mild chest pain with walking with subsided.  This evening at  approximately 7 PM while walking his dog he developed more intense severe chest pain ultimately prompting ER evaluation.  In the emergency room he had 8 out of 10 chest pain with inferolateral ST-T wave changes and developed subtle 1 to 2 mm J-point elevation in aVR and V1.  A code STEMI was activated.  He was treated with heparin and aspirin.  I saw him in the emergency room and he was brought emergently to the catheterization laboratory for emergent cardiac catheterization. Troy Sine, MD, [facility] 12/05/2017 12:32 AM

## 2017-12-04 NOTE — ED Notes (Signed)
Cards at the bedside

## 2017-12-04 NOTE — ED Triage Notes (Signed)
Pt was walking his dog around 7pm started having sharp, pressure type chest pain similar to MI in 2011 in which one stent. Pt reports weakness, dizziness, and sob. EMS gave 324mg  ASA and 2 nitro with improvement of pain from 10/10 to 5/10. Pt sees [facility].

## 2017-12-05 ENCOUNTER — Inpatient Hospital Stay (HOSPITAL_COMMUNITY): Payer: BLUE CROSS/BLUE SHIELD

## 2017-12-05 ENCOUNTER — Encounter (HOSPITAL_COMMUNITY): Payer: Self-pay | Admitting: Cardiovascular Disease

## 2017-12-05 DIAGNOSIS — I2510 Atherosclerotic heart disease of native coronary artery without angina pectoris: Secondary | ICD-10-CM

## 2017-12-05 DIAGNOSIS — I2102 ST elevation (STEMI) myocardial infarction involving left anterior descending coronary artery: Principal | ICD-10-CM

## 2017-12-05 DIAGNOSIS — Z888 Allergy status to other drugs, medicaments and biological substances status: Secondary | ICD-10-CM | POA: Diagnosis not present

## 2017-12-05 DIAGNOSIS — Z79890 Hormone replacement therapy (postmenopausal): Secondary | ICD-10-CM | POA: Diagnosis not present

## 2017-12-05 DIAGNOSIS — E785 Hyperlipidemia, unspecified: Secondary | ICD-10-CM | POA: Diagnosis not present

## 2017-12-05 DIAGNOSIS — Z955 Presence of coronary angioplasty implant and graft: Secondary | ICD-10-CM | POA: Diagnosis not present

## 2017-12-05 DIAGNOSIS — Z9119 Patient's noncompliance with other medical treatment and regimen: Secondary | ICD-10-CM | POA: Diagnosis not present

## 2017-12-05 DIAGNOSIS — Z9861 Coronary angioplasty status: Secondary | ICD-10-CM | POA: Diagnosis not present

## 2017-12-05 DIAGNOSIS — I2101 ST elevation (STEMI) myocardial infarction involving left main coronary artery: Secondary | ICD-10-CM | POA: Diagnosis not present

## 2017-12-05 DIAGNOSIS — G4731 Primary central sleep apnea: Secondary | ICD-10-CM | POA: Diagnosis not present

## 2017-12-05 DIAGNOSIS — E669 Obesity, unspecified: Secondary | ICD-10-CM | POA: Diagnosis present

## 2017-12-05 DIAGNOSIS — Z8249 Family history of ischemic heart disease and other diseases of the circulatory system: Secondary | ICD-10-CM | POA: Diagnosis not present

## 2017-12-05 DIAGNOSIS — I10 Essential (primary) hypertension: Secondary | ICD-10-CM | POA: Diagnosis not present

## 2017-12-05 DIAGNOSIS — G4733 Obstructive sleep apnea (adult) (pediatric): Secondary | ICD-10-CM | POA: Diagnosis present

## 2017-12-05 DIAGNOSIS — I472 Ventricular tachycardia: Secondary | ICD-10-CM | POA: Diagnosis not present

## 2017-12-05 DIAGNOSIS — Z7982 Long term (current) use of aspirin: Secondary | ICD-10-CM | POA: Diagnosis not present

## 2017-12-05 DIAGNOSIS — R001 Bradycardia, unspecified: Secondary | ICD-10-CM | POA: Diagnosis not present

## 2017-12-05 DIAGNOSIS — Z79899 Other long term (current) drug therapy: Secondary | ICD-10-CM | POA: Diagnosis not present

## 2017-12-05 DIAGNOSIS — I213 ST elevation (STEMI) myocardial infarction of unspecified site: Secondary | ICD-10-CM | POA: Diagnosis present

## 2017-12-05 DIAGNOSIS — K219 Gastro-esophageal reflux disease without esophagitis: Secondary | ICD-10-CM | POA: Diagnosis present

## 2017-12-05 DIAGNOSIS — I252 Old myocardial infarction: Secondary | ICD-10-CM | POA: Diagnosis not present

## 2017-12-05 LAB — BASIC METABOLIC PANEL
Anion gap: 8 (ref 5–15)
BUN: 12 mg/dL (ref 8–23)
CO2: 20 mmol/L — ABNORMAL LOW (ref 22–32)
Calcium: 8.1 mg/dL — ABNORMAL LOW (ref 8.9–10.3)
Chloride: 109 mmol/L (ref 98–111)
Creatinine, Ser: 0.97 mg/dL (ref 0.61–1.24)
GFR calc Af Amer: >60 mL/min (ref >60)
GFR calc non Af Amer: >60 mL/min (ref >60)
Glucose, Bld: 89 mg/dL (ref 70–99)
Potassium: 3.8 mmol/L (ref 3.5–5.1)
Sodium: 137 mmol/L (ref 135–145)

## 2017-12-05 LAB — POCT I-STAT, CHEM 8
BUN: 15 mg/dL (ref 8–23)
Calcium, Ion: 1.15 mmol/L (ref 1.15–1.40)
Chloride: 102 mmol/L (ref 98–111)
Creatinine, Ser: 0.9 mg/dL (ref 0.61–1.24)
Glucose, Bld: 102 mg/dL — ABNORMAL HIGH (ref 70–99)
HCT: 37 % — ABNORMAL LOW (ref 39.0–52.0)
Hemoglobin: 12.6 g/dL — ABNORMAL LOW (ref 13.0–17.0)
Potassium: 3.7 mmol/L (ref 3.5–5.1)
Sodium: 136 mmol/L (ref 135–145)
TCO2: 22 mmol/L (ref 22–32)

## 2017-12-05 LAB — HIV ANTIBODY (ROUTINE TESTING W REFLEX): HIV Screen 4th Generation wRfx: NONREACTIVE

## 2017-12-05 LAB — CBC
HCT: 42.4 % (ref 39.0–52.0)
Hemoglobin: 14 g/dL (ref 13.0–17.0)
MCH: 30.6 pg (ref 26.0–34.0)
MCHC: 33 g/dL (ref 30.0–36.0)
MCV: 92.8 fL (ref 80.0–100.0)
Platelets: 212 10*3/uL (ref 150–400)
RBC: 4.57 MIL/uL (ref 4.22–5.81)
RDW: 12.8 % (ref 11.5–15.5)
WBC: 8.6 10*3/uL (ref 4.0–10.5)

## 2017-12-05 LAB — PROTIME-INR
INR: 1.34
Prothrombin Time: 16.5 seconds — ABNORMAL HIGH (ref 11.4–15.2)

## 2017-12-05 LAB — MRSA PCR SCREENING: MRSA by PCR: NEGATIVE

## 2017-12-05 LAB — TROPONIN I
Troponin I: 38.55 ng/mL (ref <0.03)
Troponin I: >65 ng/mL (ref <0.03)
Troponin I: >65 ng/mL (ref <0.03)

## 2017-12-05 LAB — ECHOCARDIOGRAM COMPLETE
Height: 69 in
Weight: 2976 oz

## 2017-12-05 LAB — POCT ACTIVATED CLOTTING TIME: Activated Clotting Time: 549 seconds

## 2017-12-05 MED ORDER — TIROFIBAN HCL IN NACL 5-0.9 MG/100ML-% IV SOLN
INTRAVENOUS | Status: DC | PRN
  Administered 2017-12-04: 0.15 ug/kg/min via INTRAVENOUS

## 2017-12-05 MED ORDER — ZOLPIDEM TARTRATE 5 MG PO TABS
5.0000 mg | ORAL_TABLET | Freq: Every evening | ORAL | Status: DC | PRN
  Administered 2017-12-06: 5 mg via ORAL
  Filled 2017-12-05: qty 1

## 2017-12-05 MED ORDER — TICAGRELOR 90 MG PO TABS
90.0000 mg | ORAL_TABLET | Freq: Two times a day (BID) | ORAL | Status: DC
  Administered 2017-12-05 – 2017-12-06 (×3): 90 mg via ORAL
  Filled 2017-12-05 (×3): qty 1

## 2017-12-05 MED ORDER — NITROGLYCERIN 0.4 MG SL SUBL: 0.4000 mg | SUBLINGUAL_TABLET | SUBLINGUAL | Status: DC | PRN

## 2017-12-05 MED ORDER — ONDANSETRON HCL 4 MG/2ML IJ SOLN: 4.0000 mg | Freq: Four times a day (QID) | INTRAMUSCULAR | Status: DC | PRN

## 2017-12-05 MED ORDER — SODIUM CHLORIDE 0.9% FLUSH
3.0000 mL | Freq: Two times a day (BID) | INTRAVENOUS | Status: DC
  Administered 2017-12-05 – 2017-12-06 (×2): 3 mL via INTRAVENOUS

## 2017-12-05 MED ORDER — NITROGLYCERIN 1 MG/10 ML FOR IR/CATH LAB
INTRA_ARTERIAL | Status: DC | PRN
  Administered 2017-12-04: 200 ug via INTRACORONARY

## 2017-12-05 MED ORDER — IOHEXOL 350 MG/ML SOLN
INTRAVENOUS | Status: DC | PRN
  Administered 2017-12-05: 200 mL via INTRA_ARTERIAL

## 2017-12-05 MED ORDER — CARVEDILOL 3.125 MG PO TABS
3.1250 mg | ORAL_TABLET | Freq: Two times a day (BID) | ORAL | Status: DC
  Administered 2017-12-05 – 2017-12-06 (×3): 3.125 mg via ORAL
  Filled 2017-12-05 (×3): qty 1

## 2017-12-05 MED ORDER — LOSARTAN POTASSIUM 50 MG PO TABS
50.0000 mg | ORAL_TABLET | Freq: Every day | ORAL | Status: DC
  Administered 2017-12-05 – 2017-12-06 (×2): 50 mg via ORAL
  Filled 2017-12-05 (×2): qty 1

## 2017-12-05 MED ORDER — SODIUM CHLORIDE 0.9 % IV SOLN: 0.1500 mg/kg/h | INTRAVENOUS | Status: DC

## 2017-12-05 MED ORDER — BIVALIRUDIN TRIFLUOROACETATE 250 MG IV SOLR
INTRAVENOUS | Status: AC
  Filled 2017-12-05: qty 250

## 2017-12-05 MED ORDER — SODIUM CHLORIDE 0.9 % IV SOLN
1.7500 mg/kg/h | INTRAVENOUS | Status: AC
  Administered 2017-12-05 (×2): 1.75 mg/kg/h via INTRAVENOUS
  Filled 2017-12-05: qty 250

## 2017-12-05 MED ORDER — SODIUM CHLORIDE 0.9% FLUSH: 3.0000 mL | INTRAVENOUS | Status: DC | PRN

## 2017-12-05 MED ORDER — ASPIRIN EC 81 MG PO TBEC: 81.0000 mg | DELAYED_RELEASE_TABLET | Freq: Every day | ORAL | Status: DC

## 2017-12-05 MED ORDER — ACETAMINOPHEN 325 MG PO TABS: 650.0000 mg | ORAL_TABLET | ORAL | Status: DC | PRN

## 2017-12-05 MED ORDER — DIAZEPAM 5 MG PO TABS: 5.0000 mg | ORAL_TABLET | Freq: Four times a day (QID) | ORAL | Status: DC | PRN

## 2017-12-05 MED ORDER — SODIUM CHLORIDE 0.9 % IV SOLN
INTRAVENOUS | Status: DC
  Administered 2017-12-05: 08:00:00 via INTRAVENOUS

## 2017-12-05 MED ORDER — LABETALOL HCL 5 MG/ML IV SOLN
10.0000 mg | INTRAVENOUS | Status: AC | PRN
  Administered 2017-12-05: 10 mg via INTRAVENOUS
  Filled 2017-12-05: qty 4

## 2017-12-05 MED ORDER — ASPIRIN 81 MG PO CHEW
81.0000 mg | CHEWABLE_TABLET | Freq: Every day | ORAL | Status: DC
  Administered 2017-12-05 – 2017-12-06 (×2): 81 mg via ORAL
  Filled 2017-12-05 (×2): qty 1

## 2017-12-05 MED ORDER — LORATADINE 10 MG PO TABS
10.0000 mg | ORAL_TABLET | Freq: Every day | ORAL | Status: DC
  Filled 2017-12-05: qty 1

## 2017-12-05 MED ORDER — SODIUM CHLORIDE 0.9 % IV SOLN: 250.0000 mL | INTRAVENOUS | Status: DC | PRN

## 2017-12-05 MED ORDER — PANTOPRAZOLE SODIUM 40 MG PO PACK
20.0000 mg | PACK | Freq: Every day | ORAL | Status: DC
  Filled 2017-12-05: qty 20

## 2017-12-05 MED ORDER — LOSARTAN POTASSIUM 50 MG PO TABS: 50.0000 mg | ORAL_TABLET | Freq: Every day | ORAL | Status: DC

## 2017-12-05 MED ORDER — LOSARTAN POTASSIUM 25 MG PO TABS: 25.0000 mg | ORAL_TABLET | Freq: Every day | ORAL | Status: DC

## 2017-12-05 MED ORDER — HYDRALAZINE HCL 20 MG/ML IJ SOLN: 5.0000 mg | INTRAMUSCULAR | Status: AC | PRN

## 2017-12-05 MED ORDER — ROSUVASTATIN CALCIUM 40 MG PO TABS: 40.0000 mg | ORAL_TABLET | Freq: Every day | ORAL | Status: DC

## 2017-12-05 NOTE — Plan of Care (Signed)
  Problem: Education: Goal: Knowledge of General Education information will improve Description Including pain rating scale, medication(s)/side effects and non-pharmacologic comfort measures Outcome: Progressing   Problem: Clinical Measurements: Goal: Respiratory complications will improve Outcome: Progressing Goal: Cardiovascular complication will be avoided Outcome: Progressing  NSR Problem: Coping: Goal: Level of anxiety will decrease Outcome: Progressing   Problem: Elimination: Goal: Will not experience complications related to bowel motility Outcome: Progressing  Voiding in urinal Problem: Safety: Goal: Ability to remain free from injury will improve Outcome: Progressing   Problem: Skin Integrity: Goal: Risk for impaired skin integrity will decrease Outcome: Progressing

## 2017-12-05 NOTE — Progress Notes (Signed)
LVEF 25-30% after large anterior wall MI. Fortunately, he is hemodynamically stable. He has allergy to lisinopril (rash), thus not a candidate. Resume losartan at lower dose 50 mg today, increase tomorrow as tolerated. Started coreg 3.125 mg bid today.  Given his low LVEF, recommend wearable defibrillator.  Michael Mormon, MD [facility] [place] Pager: [phone] Office: [phone] If no answer Cell [phone]

## 2017-12-05 NOTE — Progress Notes (Addendum)
Subjective:  No chest pain No shortness of breath  Occasional NSVT on telemetry  Objective:  Vital Signs in the last 24 hours: Temp:  [97.6 F (36.4 C)-98.5 F (36.9 C)] 97.8 F (36.6 C) (10/08 0735) Pulse Rate:  [0-83] 63 (10/08 0700) Resp:  [0-77] 19 (10/08 0700) BP: (105-151)/(73-111) 126/89 (10/08 0700) SpO2:  [0 %-100 %] 94 % (10/08 0700) Weight:  [84.4 kg] 84.4 kg (10/07 2126)  Intake/Output from previous day: 10/07 0701 - 10/08 0700 In: 1043.7 [I.V.:1043.7] Out: 665 [Urine:665] Intake/Output from this shift: No intake/output data recorded.  Physical Exam: Physical Exam  Constitutional: He is oriented to person, place, and time. He appears well-developed and well-nourished.  HENT:  Head: Normocephalic and atraumatic.  Neck: Normal range of motion. Neck supple. No JVD present.  Cardiovascular: Normal rate, regular rhythm and normal heart sounds.  No murmur heard. Pulmonary/Chest: Effort normal and breath sounds normal. He has no wheezes. He has no rales.  Abdominal: Soft. Bowel sounds are normal. There is no tenderness.  Musculoskeletal: He exhibits no edema.  Lymphadenopathy:    He has no cervical adenopathy.  Neurological: He is alert and oriented to person, place, and time. No cranial nerve deficit.  Skin: Skin is warm and dry.  Psychiatric: He has a normal mood and affect.     Lab Results: Recent Labs    12/04/17 2221 12/05/17 0009 12/05/17 0642  WBC 7.9  --  8.6  HGB 13.8 12.6* 14.0  PLT 214  --  212   Recent Labs    12/04/17 2221 12/05/17 0009 12/05/17 0642  NA 138 136 137  K 4.4 3.7 3.8  CL 105 102 109  CO2 22  --  20*  GLUCOSE 94 102* 89  BUN 21 15 12   CREATININE 2.79* 0.90 0.97   Recent Labs    12/05/17 0112 12/05/17 0642  TROPONINI 38.55* >65.00*   Hepatic Function Panel Recent Labs    12/04/17 2221  PROT 6.6  ALBUMIN 3.7  AST 26  ALT 23  ALKPHOS 75  BILITOT 0.7   Recent Labs    12/04/17 2221  CHOL 183      Cardiac Studies: EKG 12/04/2017: Sinus rhythm STE aVR, V1, diffuse ST depression Cath 12/04/2017:  Prox RCA lesion is 40% stenosed.  Mid RCA lesion is 50% stenosed.  Dist RCA lesion is 40% stenosed.  Post Atrio lesion is 40% stenosed.  Dist LM to Ost LAD lesion is 100% stenosed.  Ost LAD to Prox LAD lesion is 100% stenosed.  A stent was successfully placed.  Post intervention, there is a 0% residual stenosis.  Post intervention, there is a 0% residual stenosis.   Acute ST segment elevation myocardial infarction secondary to total ostial occlusion proximal to the previously placed proximal LAD stent from 2011 in a large LAD vessel.   Normal ramus intermediate vessel and left circumflex coronary artery.  Large dominant RCA with mild to moderate irregularity throughout with 40-50 and 40% stenoses.  LVEDP 21 mmHg.  Successful PCI to the LAD ostially approximately with ultimate insertion of a 3.0 x 38 mm Resolute Onyx stent postdilated to 3.34 mm with the 100% occlusion being reduced to 0% and TIMI 0 flow being improved to TIMI-3 flow.  Initially there was apical LAD thrombus which resolved post Aggrastat and with revascularization.   Assessment/Plan:   62 y/o Caucasian male with CAD s/p prior LAD BMS 2011, now with anterior STEMI 12/04/2017 s/p PPCI to ostial LAD, hypertension, hyperlipidemia  STEMI: Successful PPCI pstial LAD 3.0 X 38 mm Resolute Onyx DES overlapping with prior BMS Moderate disease in RCA and LCx with ectatic vessels.  Hemodynamically stable Echocardiogram pending DAPT aspirin/Brilinta for at least 1 year.  Allergic to metoprolol. I do not think this was true allergy, but significant bradycardia. Start carvedilol 3.125 mg bid and monitor. Resume home medication losartan at lower dose of 50 mg daily. He is reportedly intolerant to multiple statins and is being approved for PCSK9i through his PCP Dr. Ernie Hew. Continue Zetia.  Cardiac  rehab  Hyperlipidemia: Management as above.  Hypertension: Management as above  Transfer to telemetry after echocardiogram   LOS: 0 days    Manish J Patwardhan 12/05/2017, 8:32 AM  Nigel Mormon, MD [facility] [place] Pager: [phone] Office: [phone] If no answer Cell [phone]

## 2017-12-05 NOTE — Care Management Note (Addendum)
Case Management Note  Patient Details  Name: Michael Terrell MRN: [id number] Date of Birth: 04/11/1955  Subjective/Objective:  62 yo male presented with STEMI s/p cath with PCI.                  Action/Plan: CM met with patient/spouse to discuss transitional care needs. Patient lives at home with spouse, independent with ADLs, with no DME in use. Patient verified PCP as: Dr. Elizabeth Dewey; Pharmacy of choice: [facility]. Patient will transition home on Brilinta with copay card provided and patient informed of the monthly est cost of $50, with patient verbalizing understanding. CM discussed the [facility] Pharmacy service, with patient requesting his Rxs be filled prior to transitioning home. Patient's spouse will provide transportation home with CM to continue to follow.   Expected Discharge Date:                  Expected Discharge Plan:  Home/Self Care  In-House Referral:  NA  Discharge planning Services  CM Consult, Medication Assistance(Brilinta benefits check/copay card)  Post Acute Care Choice:  NA Choice offered to:  NA  DME Arranged:  N/A DME Agency:  NA  HH Arranged:  NA HH Agency:  NA  Status of Service:  In process, will continue to follow  If discussed at Long Length of Stay Meetings, dates discussed:    Additional Comments: 12/05/17 @ 1455-Natalie Gay RNCM- Call received from Dr. Patwardhan. Patient will transition home with a Life[employer]. [employer] form completed by Dr. Patwardhan and faxed by CM to [phone]; Jim, [employer] rep informed and will follow for arrangement and fitting. CM will continue to follow.   Natalie Gay RN, BSN, NCM-BC, ACM-RN [phone] 12/05/2017, 12:43 PM  

## 2017-12-05 NOTE — Progress Notes (Signed)
TR band removed @ 800. Patient tolerated well. Site clean,dry and intact. Level 0. V/S stable. Will continue to monitor site.

## 2017-12-05 NOTE — Progress Notes (Signed)
CARDIAC REHAB PHASE I   PRE:  Rate/Rhythm: 74 SR    BP: sitting 127/91    SaO2: 97 RA  MODE:  Ambulation: 370 ft   POST:  Rate/Rhythm: 84 SR    BP: sitting 142/98     SaO2: 96 RA  Pt tolerated well with slow pace. BP elevated which sounds abnormal to him. Seems depressed as he tries to exercise and watch diet.  Ed completed with pt and wife. Understands importance of Brilinta/ASA. Will refer to [place].  [id number]  [place], ACSM 12/05/2017 12:09 PM

## 2017-12-05 NOTE — Care Management (Signed)
#    3.  S/W STACY  @ [facility] [employer] # [phone]  TICAGRELOR- NONE FORMULARY   BRILINTA  90 MG  BID COVER- YES - NONE PREFERRED  CO-PAY- $ 50.00 TIER- NO PRIOR APPROVAL- NO  NO DEDUCTIBLE  PREFERRED PHARMACY : YES [facility], [facility]  90 DAY SUPPLY   FOR M/O   $ 150.00

## 2017-12-05 NOTE — Progress Notes (Signed)
Echocardiogram 2D Echocardiogram has been performed.  Michael Terrell 12/05/2017, 10:51 AM

## 2017-12-06 ENCOUNTER — Other Ambulatory Visit: Payer: Self-pay

## 2017-12-06 DIAGNOSIS — E785 Hyperlipidemia, unspecified: Secondary | ICD-10-CM | POA: Diagnosis not present

## 2017-12-06 LAB — BASIC METABOLIC PANEL
Anion gap: 8 (ref 5–15)
BUN: 11 mg/dL (ref 8–23)
CO2: 20 mmol/L — ABNORMAL LOW (ref 22–32)
Calcium: 8.7 mg/dL — ABNORMAL LOW (ref 8.9–10.3)
Chloride: 106 mmol/L (ref 98–111)
Creatinine, Ser: 0.98 mg/dL (ref 0.61–1.24)
GFR calc Af Amer: >60 mL/min (ref >60)
GFR calc non Af Amer: >60 mL/min (ref >60)
Glucose, Bld: 95 mg/dL (ref 70–99)
Potassium: 4.1 mmol/L (ref 3.5–5.1)
Sodium: 134 mmol/L — ABNORMAL LOW (ref 135–145)

## 2017-12-06 MED ORDER — PANTOPRAZOLE SODIUM 20 MG PO TBEC: 20.0000 mg | DELAYED_RELEASE_TABLET | Freq: Every day | ORAL | 6 refills | Status: DC

## 2017-12-06 MED ORDER — PANTOPRAZOLE SODIUM 20 MG PO TBEC
20.0000 mg | DELAYED_RELEASE_TABLET | Freq: Every day | ORAL | Status: DC
  Administered 2017-12-06: 20 mg via ORAL
  Filled 2017-12-06 (×2): qty 1

## 2017-12-06 MED ORDER — TICAGRELOR 90 MG PO TABS: 90.0000 mg | ORAL_TABLET | Freq: Two times a day (BID) | ORAL | 3 refills | Status: DC

## 2017-12-06 MED ORDER — CARVEDILOL 3.125 MG PO TABS: 3.1250 mg | ORAL_TABLET | Freq: Two times a day (BID) | ORAL | 3 refills | Status: DC

## 2017-12-06 MED ORDER — LOSARTAN POTASSIUM 50 MG PO TABS: 50.0000 mg | ORAL_TABLET | Freq: Every day | ORAL | 1 refills | Status: DC

## 2017-12-06 NOTE — Discharge Summary (Signed)
Physician Discharge Summary  Patient ID: Michael Terrell MRN: [id number] DOB/AGE: May 26, 1955 62 y.o.  Admit date: 12/04/2017 Discharge date: 12/06/2017  Admission Diagnoses: Chest pain  Discharge Diagnoses:  Active Problems:   Dyslipidemia   HTN (hypertension)   Central sleep apnea   OSA (obstructive sleep apnea)   Acute ST elevation myocardial infarction (STEMI) [facility])   Discharged Condition: good  Hospital Course:   62 y/o Caucasian male with CAD s/p prior LAD BMS 2011, was admitted with anterior STEMI on 12/04/2017, s/p PPCI to ostial LAD, hypertension, hyperlipidemia   With cath showing 100% ostial LAD thrombotic occlusion. Patient underwent successful primary PCI with resolute Onyx 3.0 x 38 mm drug-eluting stent. He has residual nonobstructive disease in RCA and left circumflex.  Patient's hospital course was uneventful. He did not have cardiogenic shock. In fact, patient was hypertensive during hospital stay. His home medication losartan was resumed at low dose of 50 mg daily which she tolerated well. Could not start patient on an Entresto given his rash allergy to lisinopril. Patient had previously been told to be "allergic" to metoprolol, which was primarily due to profound bradycardia. Patient was started on carvedilol 3.125 mg twice daily. He did have episodes of bradycardia in in 40s with significant sinus arrhythmia but mostly during the night. Finally, his heart rate is in 70s with appropriate increase on walking.  Echocardiogram showed EF of 20-25% with severe LAD territory hypokinesis. Given his risk for sudden cardiac death, he was recommended to wear variable cardiac defibrillator pending further optimization of his medical therapy.  Given patient's statin intolerance due to myalgias, he has been approved for Repatha through his PCP, although he has not used it yet. Will start this on discharge.   I will see the patient for follow-up on 12/15/2017. Patient may  return to light duty work next week.   Consults: Case Mangement  Significant Diagnostic Studies:  Hospital echocardiogram 12/05/2017: - Left ventricle: The cavity size was mildly dilated. Systolic   function was severely reduced. The estimated ejection fraction   was in the range of 25% to 30%. Doppler parameters are consistent   with abnormal left ventricular relaxation (grade 1 diastolic   dysfunction). - Regional wall motion abnormality: Severe hypokinesis of the   entire anterior, basal-mid anteroseptal, and apical myocardium. - Left atrium: The atrium was mildly dilated. - Tricuspid valve: There was mild-moderate regurgitation. - Pulmonary arteries: PA peak pressure: 38 mm Hg (S).  Impressions:  - Severe LAD territory hypokinesis. EF 25-30%. Grade 1 diastolic   dysfunction.   Mild to moderate tricuspid regurgitation. PASP 38 mmHg.  Results for Michael Terrell, Michael Terrell (MRN [id number]) as of 12/06/2017 08:30  Ref. Range 12/05/2017 01:12 12/05/2017 06:42 12/05/2017 12:22 12/06/2017 83:41  BASIC METABOLIC PANEL Unknown  Rpt (A)  Rpt  Sodium Latest Ref Range: 135 - 145 mmol/L  137    Potassium Latest Ref Range: 3.5 - 5.1 mmol/L  3.8    Chloride Latest Ref Range: 98 - 111 mmol/L  109    CO2 Latest Ref Range: 22 - 32 mmol/L  20 (L)    Glucose Latest Ref Range: 70 - 99 mg/dL  89    BUN Latest Ref Range: 8 - 23 mg/dL  12    Creatinine Latest Ref Range: 0.61 - 1.24 mg/dL  0.97    Calcium Latest Ref Range: 8.9 - 10.3 mg/dL  8.1 (L)    Anion gap Latest Ref Range: 5 - 15   8  GFR, Est Non African American Latest Ref Range: >60 mL/min  >60    GFR, Est African American Latest Ref Range: >60 mL/min  >60    Troponin I Latest Ref Range: <0.03 ng/mL 38.55 (HH) >65.00 (HH) >65.00 (HH)    Results for Michael Terrell, Michael Terrell (MRN [id number]) as of 12/06/2017 08:30  Ref. Range 12/04/2017 22:21  Total CHOL/HDL Ratio Latest Units: RATIO 5.9  Cholesterol Latest Ref Range: 0 - 200 mg/dL 183  HDL  Cholesterol Latest Ref Range: >40 mg/dL 31 (L)  LDL (calc) Latest Ref Range: 0 - 99 mg/dL 109 (H)  Triglycerides Latest Ref Range: <150 mg/dL 215 (H)  VLDL Latest Ref Range: 0 - 40 mg/dL 43 (H)    Treatments:  Cath 12/04/2017: LM: Normal 100% ostial LAD occlusion, successful PPI with Resolute Onyx DES 3.0 X 38 mm Moderate nonobstructive disease in RCA and LCx.  Discharge Exam: Blood pressure (!) 120/92, pulse 69, temperature 97.9 F (36.6 C), temperature source Oral, resp. rate 13, height 5\' 9"  (1.753 m), weight 84.4 kg, SpO2 98 %. Constitutional: He is oriented to person, place, and time. He appears well-developed and well-nourished.  HENT:  Head: Normocephalic and atraumatic.  Neck: Normal range of motion. Neck supple. No JVD present.  Cardiovascular: Normal rate, regular rhythm and normal heart sounds.  No murmur heard. Pulmonary/Chest: Effort normal and breath sounds normal. He has no wheezes. He has no rales.  Abdominal: Soft. Bowel sounds are normal. There is no tenderness.  Musculoskeletal: He exhibits no edema.  Lymphadenopathy:    He has no cervical adenopathy.  Neurological: He is alert and oriented to person, place, and time. No cranial nerve deficit.  Skin: Skin is warm and dry.  Psychiatric: He has a normal mood and affect.    Disposition: Discharge disposition: 01-Home or Self Care       Discharge Instructions    Amb Referral to Cardiac Rehabilitation   Complete by:  As directed    Diagnosis:   Coronary Stents STEMI PTCA     Diet - low sodium heart healthy   Complete by:  As directed    Increase activity slowly   Complete by:  As directed      Allergies as of 12/06/2017      Reactions   Metoprolol    Lisinopril Rash      Medication List    STOP taking these medications   amLODipine 2.5 MG tablet Commonly known as:  NORVASC   rosuvastatin 5 MG tablet Commonly known as:  CRESTOR     TAKE these medications   aspirin 81 MG tablet Take 81  mg by mouth daily.   carvedilol 3.125 MG tablet Commonly known as:  COREG Take 1 tablet (3.125 mg total) by mouth 2 (two) times daily with a meal.   cetirizine 10 MG tablet Commonly known as:  ZYRTEC Take 10 mg by mouth daily as needed for allergies.   ezetimibe 10 MG tablet Commonly known as:  ZETIA Take 10 mg by mouth daily.   levothyroxine 75 MCG tablet Commonly known as:  SYNTHROID, LEVOTHROID Take 75 mcg by mouth daily before breakfast.   losartan 50 MG tablet Commonly known as:  COZAAR Take 1 tablet (50 mg total) by mouth daily. What changed:    medication strength  how much to take   montelukast 10 MG tablet Commonly known as:  SINGULAIR Take 10 mg by mouth at bedtime.   nitroGLYCERIN 0.4 MG SL tablet Commonly known as:  NITROSTAT Place 1 tablet (0.4 mg total) under the tongue every 5 (five) minutes as needed for chest pain.   pantoprazole 20 MG tablet Commonly known as:  PROTONIX Take 20 mg by mouth daily.   ranitidine 300 MG tablet Commonly known as:  ZANTAC Take 300 mg by mouth daily.   REFRESH OP Place 1 drop into both eyes 3 (three) times daily as needed (for dry eyes).   ticagrelor 90 MG Tabs tablet Commonly known as:  BRILINTA Take 1 tablet (90 mg total) by mouth 2 (two) times daily.   VITAMIN D-3 PO Take 2,500 Units by mouth daily.            Durable Medical Equipment  (From admission, onward)         Start     Ordered   12/05/17 1444  For home use only DME Vest life vest  Once     12/05/17 1443         Follow-up Information    Ludia Gartland, Reynold Bowen, MD Follow up on 12/15/2017.   Specialty:  Cardiology Why:  11:30 AM Contact information: [place] [address] [phone]           Signed: Nigel Mormon 12/06/2017, 8:34 AM  Nigel Mormon, MD [facility] [place] Pager: [phone] Office: [phone] If no answer Cell [phone]

## 2017-12-06 NOTE — Progress Notes (Signed)
CARDIAC REHAB PHASE I   Pt reports he ambulated 1110 ft this am at a quick pace. Sts he felt well, no SOB. Gave pt and wife HF booklet and discussed thoroughly. Discussed low sodium and daily wts. Gave them videos to watch regarding lifevest, HF, MI, and stent while they are awaiting lifevest. Voiced understanding. He is depressed.  [id number]  [place], ACSM 12/06/2017 11:50 AM

## 2017-12-07 LAB — COMPREHENSIVE METABOLIC PANEL
ALT: 23 U/L (ref 0–44)
AST: 26 U/L (ref 15–41)
Albumin: 3.7 g/dL (ref 3.5–5.0)
Alkaline Phosphatase: 75 U/L (ref 38–126)
Anion gap: 11 (ref 5–15)
BUN: 21 mg/dL (ref 8–23)
CO2: 22 mmol/L (ref 22–32)
Calcium: 8.9 mg/dL (ref 8.9–10.3)
Chloride: 105 mmol/L (ref 98–111)
Creatinine, Ser: 2.79 mg/dL — ABNORMAL HIGH (ref 0.61–1.24)
GFR calc Af Amer: 26 mL/min — ABNORMAL LOW (ref >60)
GFR calc non Af Amer: 23 mL/min — ABNORMAL LOW (ref >60)
Glucose, Bld: 94 mg/dL (ref 70–99)
Potassium: 4.4 mmol/L (ref 3.5–5.1)
Sodium: 138 mmol/L (ref 135–145)
Total Bilirubin: 0.7 mg/dL (ref 0.3–1.2)
Total Protein: 6.6 g/dL (ref 6.5–8.1)

## 2017-12-08 ENCOUNTER — Telehealth (HOSPITAL_COMMUNITY): Payer: Self-pay

## 2017-12-08 NOTE — Telephone Encounter (Signed)
Pt insurance is active and benefits verified through [employer]. Co-pay $0.00, DED $3,000.00/$176.79 met, out of pocket $7,000.00/$703.76 met, co-insurance 20%. No pre-authorization. Passport, 12/08/17 @ 10:28AM, REF# [id number]  Will contact patient to see if he is interested in the Cardiac Rehab Program. If interested, patient will need to complete follow up appt. Once completed, patient will be contacted for scheduling upon review by the RN Navigator.

## 2017-12-08 NOTE — Telephone Encounter (Signed)
Called patient to see if he is interested in the Cardiac Rehab Program. Patient expressed interest. Explained scheduling process and went over insurance, patient verbalized understanding. Will contact patient for scheduling once f/u has been completed.  °

## 2017-12-11 DIAGNOSIS — I25119 Atherosclerotic heart disease of native coronary artery with unspecified angina pectoris: Secondary | ICD-10-CM | POA: Diagnosis not present

## 2017-12-15 DIAGNOSIS — I5020 Unspecified systolic (congestive) heart failure: Secondary | ICD-10-CM | POA: Diagnosis not present

## 2017-12-15 DIAGNOSIS — I252 Old myocardial infarction: Secondary | ICD-10-CM | POA: Diagnosis not present

## 2017-12-15 DIAGNOSIS — I2510 Atherosclerotic heart disease of native coronary artery without angina pectoris: Secondary | ICD-10-CM | POA: Diagnosis not present

## 2017-12-15 DIAGNOSIS — E782 Mixed hyperlipidemia: Secondary | ICD-10-CM | POA: Diagnosis not present

## 2017-12-21 ENCOUNTER — Telehealth (HOSPITAL_COMMUNITY): Payer: Self-pay

## 2017-12-21 NOTE — Telephone Encounter (Signed)
Attempted to contact pt in regards to Cardiac rehab - lm on vm

## 2017-12-29 ENCOUNTER — Telehealth (HOSPITAL_COMMUNITY): Payer: Self-pay

## 2017-12-29 NOTE — Telephone Encounter (Signed)
Called and spoke with pt in regards to Cardiac Rehab - Scheduled orientation on 12/26 at 8:00am. Pt will attend the 8:15am exc class. Mailed packet.

## 2018-01-06 DIAGNOSIS — I2102 ST elevation (STEMI) myocardial infarction involving left anterior descending coronary artery: Secondary | ICD-10-CM | POA: Diagnosis not present

## 2018-01-06 DIAGNOSIS — I252 Old myocardial infarction: Secondary | ICD-10-CM | POA: Diagnosis not present

## 2018-01-06 DIAGNOSIS — Z955 Presence of coronary angioplasty implant and graft: Secondary | ICD-10-CM | POA: Diagnosis not present

## 2018-01-06 DIAGNOSIS — Z9861 Coronary angioplasty status: Secondary | ICD-10-CM | POA: Diagnosis not present

## 2018-01-09 ENCOUNTER — Telehealth (HOSPITAL_COMMUNITY): Payer: Self-pay

## 2018-01-19 ENCOUNTER — Ambulatory Visit (INDEPENDENT_AMBULATORY_CARE_PROVIDER_SITE_OTHER): Payer: BLUE CROSS/BLUE SHIELD | Admitting: Neurology

## 2018-01-19 DIAGNOSIS — Z23 Encounter for immunization: Secondary | ICD-10-CM | POA: Diagnosis not present

## 2018-01-19 DIAGNOSIS — E663 Overweight: Secondary | ICD-10-CM

## 2018-01-19 DIAGNOSIS — Z6827 Body mass index (BMI) 27.0-27.9, adult: Secondary | ICD-10-CM | POA: Diagnosis not present

## 2018-01-19 DIAGNOSIS — I519 Heart disease, unspecified: Secondary | ICD-10-CM | POA: Diagnosis not present

## 2018-01-19 DIAGNOSIS — G4733 Obstructive sleep apnea (adult) (pediatric): Secondary | ICD-10-CM

## 2018-01-19 DIAGNOSIS — M791 Myalgia, unspecified site: Secondary | ICD-10-CM | POA: Diagnosis not present

## 2018-01-19 DIAGNOSIS — E782 Mixed hyperlipidemia: Secondary | ICD-10-CM | POA: Diagnosis not present

## 2018-01-19 DIAGNOSIS — G2581 Restless legs syndrome: Secondary | ICD-10-CM

## 2018-01-19 DIAGNOSIS — K219 Gastro-esophageal reflux disease without esophagitis: Secondary | ICD-10-CM | POA: Diagnosis not present

## 2018-01-19 DIAGNOSIS — G4731 Primary central sleep apnea: Secondary | ICD-10-CM

## 2018-01-19 DIAGNOSIS — G4720 Circadian rhythm sleep disorder, unspecified type: Secondary | ICD-10-CM

## 2018-01-19 DIAGNOSIS — G4761 Periodic limb movement disorder: Secondary | ICD-10-CM

## 2018-01-29 ENCOUNTER — Telehealth: Payer: Self-pay

## 2018-01-29 DIAGNOSIS — I2510 Atherosclerotic heart disease of native coronary artery without angina pectoris: Secondary | ICD-10-CM | POA: Diagnosis not present

## 2018-01-29 DIAGNOSIS — I255 Ischemic cardiomyopathy: Secondary | ICD-10-CM | POA: Diagnosis not present

## 2018-01-29 NOTE — Procedures (Signed)
PATIENT'S NAME:  Michael Terrell, Michael Terrell DOB:      06/08/1955      MR#:    [id number]     DATE OF RECORDING: 01/19/2018 REFERRING M.D.:  Rachell Cipro, MD Study Performed:  Split-Night Titration Study HISTORY: 62 year old man with a history of coronary artery disease, status post MI, status post stent placement, reflux disease, hypertension, hyperlipidemia, and overweight state who was previously diagnosed with sleep apnea and placed on BiPAP therapy. He no longer uses his machine. The patient endorsed the Epworth Sleepiness Scale at 10/24 points. The patient's weight 191 pounds with a height of 69 (inches), resulting in a BMI of 28.4 kg/m2. The patient's neck circumference measured 17.8 inches.  CURRENT MEDICATIONS: Norvasc, Zyrtec, Vitamin D3, Zetia, Synthroid, Singulair, Nitrostat, Protonix, Crestor.  PROCEDURE:  This is a multichannel digital polysomnogram utilizing the Somnostar 11.2 system.  Electrodes and sensors were applied and monitored per AASM Specifications.   EEG, EOG, Chin and Limb EMG, were sampled at 200 Hz.  ECG, Snore and Nasal Pressure, Thermal Airflow, Respiratory Effort, CPAP Flow and Pressure, Oximetry was sampled at 50 Hz. Digital video and audio were recorded.      BASELINE STUDY WITHOUT CPAP RESULTS:  Lights Out was at 22:03 and Lights On at 04:58 for the night, split study started at 00:27, epoch 294. Total recording time (TRT) was 143.5, with a total sleep time (TST) of 124 minutes.  The patient's sleep latency was 8.5 minutes.  REM sleep was absent prior to PAP initiation. The sleep efficiency was 86.4 %.    SLEEP ARCHITECTURE: WASO (Wake after sleep onset) was 5 minutes, Stage N1 was 3 minutes, Stage N2 was 80 minutes, Stage N3 was 41 minutes and Stage R (REM sleep) was 0 minutes.  The percentages were Stage N1 2.4%, Stage N2 64.5%, Stage N3 33.1% and Stage R (REM sleep) was absent. The arousals were noted as: 29 were spontaneous, 3 were associated with PLMs, 54 were  associated with respiratory events.  RESPIRATORY ANALYSIS:  There were a total of 102 respiratory events:  20 obstructive apneas, 15 central apneas and 0 mixed apneas with a total of 35 apneas and an apnea index (AI) of 16.9. There were 67 hypopneas with a hypopnea index of 32.4. The patient also had 0 respiratory event related arousals (RERAs).  Snoring was noted.     The total APNEA/HYPOPNEA INDEX (AHI) was 49.4 /hour and the total RESPIRATORY DISTURBANCE INDEX was 49.4 /hour.  0 events occurred in REM sleep and 149 events in NREM. The REM AHI was 0, /hour versus a non-REM AHI of 49.4 /hour. The patient spent 306 minutes sleep time in the supine position 77 minutes in non-supine. The supine AHI was 49.6 /hour versus a non-supine AHI of 0.0 /hour.  OXYGEN SATURATION & C02:  The wake baseline 02 saturation was 92%, with the lowest being 89%. Time spent below 89% saturation equaled 0 minutes.  PERIODIC LIMB MOVEMENTS: The patient had a total of 15 Periodic Limb Movements.  The Periodic Limb Movement (PLM) index was 7.3 /hour and the PLM Arousal index was 1.5 /hour.  Audio and video analysis did not show any abnormal or unusual movements, behaviors, phonations or vocalizations. The patient took 1 bathroom break. Mils snoring was noted. The EKG was in keeping with normal sinus rhythm (NSR).   TITRATION STUDY WITH CPAP RESULTS:   The patient was fitted with a medium P30 nasal mask. CPAP was initiated at 5 cmH20 with heated humidity per  AASM split night standards and pressure was advanced to 9 cmH20 because of hypopneas, apneas and desaturations. At a PAP pressure of 9 cmH20, but he achieved only little sleep. On the pressure of 8 cm, his AHI was 6.4/hour, with non-supine sleep achieved and O2 nadir of 93%.  Total recording time (TRT) was 272.5 minutes, with a total sleep time (TST) of 259 minutes. The patient's sleep latency was 6 minutes. REM latency was 54.5 minutes. The sleep efficiency was 95%.     SLEEP ARCHITECTURE: Wake after sleep was 6.5 minutes, Stage N1 5 minutes, Stage N2 165.5 minutes, Stage N3 56.5 minutes and Stage R (REM sleep) 32 minutes. The percentages were: Stage N1 1.9%, Stage N2 63.9%, Stage N3 21.8% and Stage R (REM sleep) 12.4%. The arousals were noted as: 11 were spontaneous, 5 were associated with PLMs, 6 were associated with respiratory events.  RESPIRATORY ANALYSIS:  There were a total of 23 respiratory events: 11 obstructive apneas, 9 central apneas and 0 mixed apneas with a total of 20 apneas and an apnea index (AI) of 4.6. There were 3 hypopneas with a hypopnea index of .7 /hour. The patient also had 0 respiratory event related arousals (RERAs).      The total APNEA/HYPOPNEA INDEX  (AHI) was 5.3 /hour and the total RESPIRATORY DISTURBANCE INDEX was 5.3 /hour.  0 events occurred in REM sleep and 23 events in NREM. The REM AHI was 0 /hour versus a non-REM AHI of 6.1 /hour. The patient spent 70% of total sleep time in the supine position. The supine AHI was 6.9 /hour, versus a non-supine AHI of 1.6/hour.  OXYGEN SATURATION & C02:  The wake baseline 02 saturation was 95%, with the lowest being 92%. Time spent below 89% saturation equaled 0 minutes.  PERIODIC LIMB MOVEMENTS: The patient had a total of 118 Periodic Limb Movements. The Periodic Limb Movement (PLM) index was 27.3 /hour and the PLM Arousal index was 1.2 /hour.  Post-study, the patient indicated that sleep was the same as usual.  POLYSOMNOGRAPHY IMPRESSION :   1. Obstructive Sleep Apnea (OSA)  2. Dysfunctions associated with sleep stages or arousals from sleep 3. Periodic Limb Movement Disorder (PLMD)  RECOMMENDATIONS:  1. This patient has severe obstructive sleep apnea and responded to CPAP therapy. Based on the test results, I will recommend a home CPAP treatment pressure of 10 cm via medium nasal pillows with heated humidity. The patient should be reminded to be fully compliant with PAP therapy to  improve sleep related symptoms and decrease long term cardiovascular risks. Please note that untreated obstructive sleep apnea may carry additional perioperative morbidity. Patients with significant obstructive sleep apnea should receive perioperative PAP therapy and the surgeons and particularly the anesthesiologist should be informed of the diagnosis and the severity of the sleep disordered breathing. 2. Mild PLMs (periodic limb movements of sleep) were noted during this study with no significant arousals; clinical correlation is recommended. 3. This study shows sleep fragmentation and abnormal sleep stage percentages; these are nonspecific findings and per se do not signify an intrinsic sleep disorder or a cause for the patient's sleep-related symptoms. Causes include (but are not limited to) the first night effect of the sleep study, circadian rhythm disturbances, medication effect or an underlying mood disorder or medical problem.  4. The patient should be cautioned not to drive, work at heights, or operate dangerous or heavy equipment when tired or sleepy. Review and reiteration of good sleep hygiene measures should be pursued with  any patient. 5. The patient will be seen in follow-up in the sleep clinic at [facility] for discussion of the test results, symptom and treatment compliance review, further management strategies, etc. The referring provider will be notified of the test results.  I certify that I have reviewed the entire raw data recording prior to the issuance of this report in accordance with the Standards of Accreditation of the American Academy of Sleep Medicine (AASM)     Star Age, MD, PhD Diplomat, American Board of Neurology and Sleep Medicine (Neurology and Sleep Medicine)

## 2018-01-29 NOTE — Telephone Encounter (Signed)
-----   Message from Star Age, MD sent at 01/29/2018  7:56 AM EST ----- Patient referred by Dr. Ernie Hew, seen by me on 08/09/17, split night sleep study on 01/19/18. Please call and notify patient that the recent sleep study confirmed the diagnosis of severe OSA. He did well with CPAP during the study with significant improvement of the respiratory events. Therefore, I would like start the patient on CPAP therapy at home by prescribing a machine for home use. I placed the order in the chart.  Please advise patient that we need a follow up appointment with either myself or one of our nurse practitioners in about 10 weeks post set-up to check for how the patient is feeling and how well the patient is using the machine, etc. Please go ahead and schedule the appointment, while you have the patient on the phone and make sure patient understands the importance of keeping this window for the FU appointment, as it is often an insurance requirement. Failing to adhere to this may result in losing coverage for sleep apnea treatment, at which point most patients are left with a choice of returning the machine or paying out of pocket (and we want neither of this to happen!).  Please re-enforce the importance of compliance with treatment and the need for [place] to monitor compliance data - again an insurance requirement and usually a good feedback for the patient as far as how they are doing.  Also remind patient, that any PAP machine or mask issues should be first addressed with the DME company, who provided the machine/mask.  Please ask if patient has a preference regarding DME company, may depend on the insurance too.  Please arrange for CPAP set up at home through a DME company of patient's choice.  Once you have spoken to the patient you can close the phone encounter. Please fax/route report to referring provider, thanks,   Star Age, MD, PhD [facility])

## 2018-01-29 NOTE — Addendum Note (Signed)
Addended by: Star Age on: 01/29/2018 07:56 AM   Modules accepted: Orders

## 2018-01-29 NOTE — Progress Notes (Signed)
Patient referred by Dr. Ernie Hew, seen by me on 08/09/17, split night sleep study on 01/19/18. Please call and notify patient that the recent sleep study confirmed the diagnosis of severe OSA. He did well with CPAP during the study with significant improvement of the respiratory events. Therefore, I would like start the patient on CPAP therapy at home by prescribing a machine for home use. I placed the order in the chart.  Please advise patient that we need a follow up appointment with either myself or one of our nurse practitioners in about 10 weeks post set-up to check for how the patient is feeling and how well the patient is using the machine, etc. Please go ahead and schedule the appointment, while you have the patient on the phone and make sure patient understands the importance of keeping this window for the FU appointment, as it is often an insurance requirement. Failing to adhere to this may result in losing coverage for sleep apnea treatment, at which point most patients are left with a choice of returning the machine or paying out of pocket (and we want neither of this to happen!).  Please re-enforce the importance of compliance with treatment and the need for [place] to monitor compliance data - again an insurance requirement and usually a good feedback for the patient as far as how they are doing.  Also remind patient, that any PAP machine or mask issues should be first addressed with the DME company, who provided the machine/mask.  Please ask if patient has a preference regarding DME company, may depend on the insurance too.  Please arrange for CPAP set up at home through a DME company of patient's choice.  Once you have spoken to the patient you can close the phone encounter. Please fax/route report to referring provider, thanks,   Star Age, MD, PhD [facility])

## 2018-01-29 NOTE — Telephone Encounter (Signed)
I called pt. I advised pt that Dr. Rexene Alberts reviewed their sleep study results and found that pt has severe osa. Dr. Rexene Alberts recommends that pt start a cpap at home. I reviewed PAP compliance expectations with the pt. Pt is agreeable to starting a CPAP. I advised pt that an order will be sent to a DME, [facility], and [facility] will call the pt within about one week after they file with the pt's insurance. [facility] will show the pt how to use the machine, fit for masks, and troubleshoot the CPAP if needed. A follow up appt was made for insurance purposes with Hoyle Sauer, NP on 04/24/18 at 8:15am. Pt verbalized understanding to arrive 15 minutes early and bring their CPAP. A letter with all of this information in it will be mailed to the pt as a reminder. I verified with the pt that the address we have on file is correct. Pt verbalized understanding of results. Pt had no questions at this time but was encouraged to call back if questions arise. I have sent the order to [facility] and have received confirmation that they have received the order.

## 2018-02-05 DIAGNOSIS — I2102 ST elevation (STEMI) myocardial infarction involving left anterior descending coronary artery: Secondary | ICD-10-CM | POA: Diagnosis not present

## 2018-02-05 DIAGNOSIS — Z9861 Coronary angioplasty status: Secondary | ICD-10-CM | POA: Diagnosis not present

## 2018-02-05 DIAGNOSIS — Z955 Presence of coronary angioplasty implant and graft: Secondary | ICD-10-CM | POA: Diagnosis not present

## 2018-02-05 DIAGNOSIS — I252 Old myocardial infarction: Secondary | ICD-10-CM | POA: Diagnosis not present

## 2018-02-08 DIAGNOSIS — G4733 Obstructive sleep apnea (adult) (pediatric): Secondary | ICD-10-CM | POA: Diagnosis not present

## 2018-02-12 ENCOUNTER — Telehealth (HOSPITAL_COMMUNITY): Payer: Self-pay

## 2018-02-13 DIAGNOSIS — I10 Essential (primary) hypertension: Secondary | ICD-10-CM | POA: Diagnosis not present

## 2018-02-13 DIAGNOSIS — E785 Hyperlipidemia, unspecified: Secondary | ICD-10-CM | POA: Diagnosis not present

## 2018-02-16 DIAGNOSIS — I509 Heart failure, unspecified: Secondary | ICD-10-CM | POA: Diagnosis not present

## 2018-02-16 DIAGNOSIS — I519 Heart disease, unspecified: Secondary | ICD-10-CM | POA: Diagnosis not present

## 2018-02-16 DIAGNOSIS — I10 Essential (primary) hypertension: Secondary | ICD-10-CM | POA: Diagnosis not present

## 2018-02-16 DIAGNOSIS — I429 Cardiomyopathy, unspecified: Secondary | ICD-10-CM | POA: Diagnosis not present

## 2018-02-19 NOTE — Progress Notes (Signed)
Michael Terrell 62 y.o. male DOB 02-Aug-1955 MRN [id number]       Nutrition  No diagnosis found. Past Medical History:  Diagnosis Date  . Acute anterior wall MI ([place]) 10/2009  . CAD (coronary artery disease)    BMS to LAD (2011)  . Chest pain   . Dyslipidemia   . Hyperlipidemia   . Hypertension   . Sinus pause   . Snoring    Meds reviewed.    Current Outpatient Medications (Endocrine & Metabolic):  .  levothyroxine (SYNTHROID, LEVOTHROID) 75 MCG tablet, Take 75 mcg by mouth daily before breakfast.  Current Outpatient Medications (Cardiovascular):  .  carvedilol (COREG) 3.125 MG tablet, Take 1 tablet (3.125 mg total) by mouth 2 (two) times daily with a meal. .  ezetimibe (ZETIA) 10 MG tablet, Take 10 mg by mouth daily. Marland Kitchen  losartan (COZAAR) 50 MG tablet, Take 1 tablet (50 mg total) by mouth daily. .  nitroGLYCERIN (NITROSTAT) 0.4 MG SL tablet, Place 1 tablet (0.4 mg total) under the tongue every 5 (five) minutes as needed for chest pain.  Current Outpatient Medications (Respiratory):  .  cetirizine (ZYRTEC) 10 MG tablet, Take 10 mg by mouth daily as needed for allergies.  .  montelukast (SINGULAIR) 10 MG tablet, Take 10 mg by mouth at bedtime.  Current Outpatient Medications (Analgesics):  .  aspirin 81 MG tablet, Take 81 mg by mouth daily.   Current Outpatient Medications (Hematological):  .  ticagrelor (BRILINTA) 90 MG TABS tablet, Take 1 tablet (90 mg total) by mouth 2 (two) times daily.  Current Outpatient Medications (Other):  Marland Kitchen  Cholecalciferol (VITAMIN D-3 PO), Take 2,500 Units by mouth daily. .  pantoprazole (PROTONIX) 20 MG tablet, Take 20 mg by mouth daily. .  pantoprazole (PROTONIX) 20 MG tablet, Take 1 tablet (20 mg total) by mouth daily. .  Polyvinyl Alcohol-Povidone (REFRESH OP), Place 1 drop into both eyes 3 (three) times daily as needed (for dry eyes).  .  ranitidine (ZANTAC) 300 MG tablet, Take 300 mg by mouth daily.   HT: Ht Readings from Last 1  Encounters:  12/04/17 5\' 9"  (1.753 m)    WT: Wt Readings from Last 5 Encounters:  12/04/17 186 lb (84.4 kg)  08/09/17 191 lb (86.6 kg)  07/21/14 195 lb 1.6 oz (88.5 kg)  03/13/14 187 lb (84.8 kg)  01/31/14 191 lb 1.6 oz (86.7 kg)     BMI = 28.19    (08/09/17)  Current tobacco use? No       Labs:  Lipid Panel     Component Value Date/Time   CHOL 183 12/04/2017 2221   CHOL 130 09/24/2013 0916   TRIG 215 (H) 12/04/2017 2221   TRIG 104 09/24/2013 0916   HDL 31 (L) 12/04/2017 2221   HDL 39 (L) 09/24/2013 0916   CHOLHDL 5.9 12/04/2017 2221   VLDL 43 (H) 12/04/2017 2221   LDLCALC 109 (H) 12/04/2017 2221   LDLCALC 70 09/24/2013 0916    Lab Results  Component Value Date   HGBA1C (H) 01/27/2010    5.7 (NOTE)                                                                       According  to the ADA Clinical Practice Recommendations for 2011, when HbA1c is used as a screening test:   >=6.5%   Diagnostic of Diabetes Mellitus           (if abnormal result  is confirmed)  5.7-6.4%   Increased risk of developing Diabetes Mellitus  References:Diagnosis and Classification of Diabetes Mellitus,Diabetes [id number],7017,79(TJQZE 1):S62-S69 and Standards of Medical Care in         Diabetes - 2011,Diabetes SPQZ,3007,62  (Suppl 1):S11-S61.   CBG (last 3)  No results for input(s): GLUCAP in the last 72 hours.  Nutrition Diagnosis ? Food-and nutrition-related knowledge deficit related to lack of exposure to information as related to diagnosis of: ? CVD ? Overweight  related to excessive energy intake as evidenced by a BMI = 28.19    (08/09/17)  Nutrition Goal(s):  ? To be determined  Plan:  Pt to attend nutrition classes ? Nutrition I ? Nutrition II ? Portion Distortion  Will provide client-centered nutrition education as part of interdisciplinary care.   Monitor and evaluate progress toward nutrition goal with team.  Laurina Bustle, MS, RD, LDN 02/19/2018 2:01 PM

## 2018-02-19 NOTE — Progress Notes (Signed)
Cardiac Rehab Medication Review by a RN  Does the patient  feel that his/her medications are working for him/her?  yes  Has the patient been experiencing any side effects to the medications prescribed?  no  Does the patient measure his/her own blood pressure or blood glucose at home?  yes Pt measures his BP at home.  It typically runs 110s/60s for patient.  Does the patient have any problems obtaining medications due to transportation or finances?   no  Understanding of regimen: good Understanding of indications: good Potential of compliance: good    RN comments: None    Michael Terrell 02/19/2018 2:28 PM

## 2018-02-19 NOTE — Telephone Encounter (Signed)
Pt's medications and allergies verified during health history assessment per patient request.  Medication changes updated in Epic.

## 2018-02-22 ENCOUNTER — Encounter (HOSPITAL_COMMUNITY)
Admission: RE | Admit: 2018-02-22 | Discharge: 2018-02-22 | Disposition: A | Discharge status: Home / Self Care | Payer: BLUE CROSS/BLUE SHIELD | Source: Ambulatory Visit | Attending: Cardiology | Admitting: Cardiology

## 2018-02-22 ENCOUNTER — Encounter (HOSPITAL_COMMUNITY): Payer: Self-pay

## 2018-02-22 VITALS — BP 118/72 | HR 63 | Ht 68.5 in | Wt 177.7 lb

## 2018-02-22 DIAGNOSIS — I10 Essential (primary) hypertension: Secondary | ICD-10-CM | POA: Insufficient documentation

## 2018-02-22 DIAGNOSIS — Z955 Presence of coronary angioplasty implant and graft: Secondary | ICD-10-CM | POA: Diagnosis not present

## 2018-02-22 DIAGNOSIS — Z7982 Long term (current) use of aspirin: Secondary | ICD-10-CM | POA: Diagnosis not present

## 2018-02-22 DIAGNOSIS — I2102 ST elevation (STEMI) myocardial infarction involving left anterior descending coronary artery: Secondary | ICD-10-CM | POA: Insufficient documentation

## 2018-02-22 DIAGNOSIS — E785 Hyperlipidemia, unspecified: Secondary | ICD-10-CM | POA: Diagnosis not present

## 2018-02-22 DIAGNOSIS — Z79890 Hormone replacement therapy (postmenopausal): Secondary | ICD-10-CM | POA: Insufficient documentation

## 2018-02-22 DIAGNOSIS — Z79899 Other long term (current) drug therapy: Secondary | ICD-10-CM | POA: Diagnosis not present

## 2018-02-22 DIAGNOSIS — I2510 Atherosclerotic heart disease of native coronary artery without angina pectoris: Secondary | ICD-10-CM | POA: Diagnosis not present

## 2018-02-22 NOTE — Progress Notes (Signed)
Michael Terrell 62 y.o. male DOB: [birth year] MRN: [id number]      Nutrition Note  1. Status post coronary artery stent placement, S/P DES LAD 12/04/17   2. STEMI involving left anterior descending coronary artery ([place]) 12/04/17    Past Medical History:  Diagnosis Date  . Acute anterior wall MI ([place]) 10/2009  . CAD (coronary artery disease)    BMS to LAD (2011)  . Chest pain   . Dyslipidemia   . Hyperlipidemia   . Hypertension   . Sinus pause   . Snoring    Meds reviewed.   Current Outpatient Medications (Endocrine & Metabolic):  .  levothyroxine (SYNTHROID, LEVOTHROID) 75 MCG tablet, Take 75 mcg by mouth daily before breakfast.  Current Outpatient Medications (Cardiovascular):  Marland Kitchen  Evolocumab (REPATHA SURECLICK) 315 MG/ML SOAJ, Inject 140 mg into the skin every 14 (fourteen) days. .  isosorbide-hydrALAZINE (BIDIL) 20-37.5 MG tablet, Take 1 tablet by mouth 2 (two) times daily. Marland Kitchen  losartan (COZAAR) 50 MG tablet, Take 1 tablet (50 mg total) by mouth daily. .  nitroGLYCERIN (NITROSTAT) 0.4 MG SL tablet, Place 1 tablet (0.4 mg total) under the tongue every 5 (five) minutes as needed for chest pain.  Current Outpatient Medications (Respiratory):  .  cetirizine (ZYRTEC) 10 MG tablet, Take 10 mg by mouth daily as needed for allergies.  .  montelukast (SINGULAIR) 10 MG tablet, Take 10 mg by mouth at bedtime as needed.   Current Outpatient Medications (Analgesics):  .  aspirin 81 MG tablet, Take 81 mg by mouth daily.   Current Outpatient Medications (Hematological):  .  ticagrelor (BRILINTA) 90 MG TABS tablet, Take 1 tablet (90 mg total) by mouth 2 (two) times daily.  Current Outpatient Medications (Other):  Marland Kitchen  Cholecalciferol (VITAMIN D-3 PO), Take 2,500 Units by mouth daily. .  pantoprazole (PROTONIX) 20 MG tablet, Take 1 tablet (20 mg total) by mouth daily. .  Polyvinyl Alcohol-Povidone (REFRESH OP), Place 1 drop into both eyes 3 (three) times daily as needed (for dry eyes).     HT: Ht Readings from Last 1 Encounters:  02/22/18 5' 8.5" (1.74 m)    WT: Wt Readings from Last 5 Encounters:  02/22/18 177 lb 11.1 oz (80.6 kg)  12/04/17 186 lb (84.4 kg)  08/09/17 191 lb (86.6 kg)  07/21/14 195 lb 1.6 oz (88.5 kg)  03/13/14 187 lb (84.8 kg)     Body mass index is 26.62 kg/m.   Current tobacco use? No  Labs:  Lipid Panel     Component Value Date/Time   CHOL 183 12/04/2017 2221   CHOL 130 09/24/2013 0916   TRIG 215 (H) 12/04/2017 2221   TRIG 104 09/24/2013 0916   HDL 31 (L) 12/04/2017 2221   HDL 39 (L) 09/24/2013 0916   CHOLHDL 5.9 12/04/2017 2221   VLDL 43 (H) 12/04/2017 2221   LDLCALC 109 (H) 12/04/2017 2221   LDLCALC 70 09/24/2013 0916    Lab Results  Component Value Date   HGBA1C (H) 01/27/2010    5.7 (NOTE)                                                                       According to the ADA Clinical Practice Recommendations for  2011, when HbA1c is used as a screening test:   >=6.5%   Diagnostic of Diabetes Mellitus           (if abnormal result  is confirmed)  5.7-6.4%   Increased risk of developing Diabetes Mellitus  References:Diagnosis and Classification of Diabetes Mellitus,Diabetes [id number],7673,41(PFXTK 1):S62-S69 and Standards of Medical Care in         Diabetes - 2011,Diabetes WIOX,7353,29  (Suppl 1):S11-S61.   CBG (last 3)  No results for input(s): GLUCAP in the last 72 hours.  Nutrition Note Spoke with pt. Nutrition plan and goals reviewed with pt. Pt is following Step 2 of the Therapeutic Lifestyle Changes diet, pt is a ovo-lacto vegetarian. Pt wants to lose wt. Pt has not been trying to lose wt. Wt loss tips reviewed (label reading, how to build a healthy plate, portion sizes, eating frequently across the day). Overall pt is doing a good job eating heart healthy discussed focusing on increasing non-starchy veggies and paying close attention to portion sizes especially cheese, carbs, and healthy fats such as nuts. Last A1c indicates  blood glucose elevated 5.7, pt was unaware of this in 2011 and is unsure if he has any more recent tests. Requested pt talk to primary care provider, and ask if any more recent tests have been conducted, and to let RD know. Per discussion, pt does not use canned/convenience foods often. Pt does not add salt to food. Pt does not eat out frequently (~2x a week). Pt expressed understanding of the information reviewed. Pt aware of nutrition education classes offered and would like to attend nutrition classes.  Nutrition Diagnosis ? Food-and nutrition-related knowledge deficit related to lack of exposure to information as related to diagnosis of: ? CVD  ? Overweight  related to excessive energy intake as evidenced by a Body mass index is 26.62 kg/m.  Nutrition Intervention ? Pt's individual nutrition plan and goals reviewed with pt. ? Pt given handouts for: ? Nutrition I class ? Nutrition II class   Nutrition Goal(s):  ? Pt to identify and limit food sources of saturated fat, trans fat, refined carbohydrates and sodium ? Pt to identify food quantities necessary to achieve weight loss of 6-24 lbs. at graduation from cardiac rehab. ? Pt to eat more and a variety of non-starchy vegetables. ? Pt to go easy on high fat cheeses.  Plan:  ? Pt to attend nutrition classes ? Nutrition I ? Nutrition II ? Portion Distortion  ? Will provide client-centered nutrition education as part of interdisciplinary care ? Monitor and evaluate progress toward nutrition goal with team.   Laurina Bustle, MS, RD, LDN 02/22/2018 11:58 AM

## 2018-02-22 NOTE — Progress Notes (Signed)
Cardiac Individual Treatment Plan  Patient Details  Name: Michael Terrell MRN: [id number] Date of Birth: [birth year] Referring Provider:     CARDIAC REHAB PHASE II ORIENTATION from 02/22/2018 in [facility]  Referring Provider  Dr. Virgina Jock      Initial Encounter Date:    CARDIAC REHAB PHASE II ORIENTATION from 02/22/2018 in [facility]  Date  02/22/18      Visit Diagnosis: Status post coronary artery stent placement, S/P DES LAD 12/04/17  STEMI involving left anterior descending coronary artery ([place]) 12/04/17  Patient's Home Medications on Admission:  Current Outpatient Medications:  .  aspirin 81 MG tablet, Take 81 mg by mouth daily. , Disp: , Rfl:  .  cetirizine (ZYRTEC) 10 MG tablet, Take 10 mg by mouth daily as needed for allergies. , Disp: , Rfl:  .  Cholecalciferol (VITAMIN D-3 PO), Take 2,500 Units by mouth daily., Disp: , Rfl:  .  Evolocumab (REPATHA SURECLICK) 025 MG/ML SOAJ, Inject 140 mg into the skin every 14 (fourteen) days., Disp: , Rfl:  .  isosorbide-hydrALAZINE (BIDIL) 20-37.5 MG tablet, Take 1 tablet by mouth 2 (two) times daily., Disp: , Rfl:  .  levothyroxine (SYNTHROID, LEVOTHROID) 75 MCG tablet, Take 75 mcg by mouth daily before breakfast., Disp: , Rfl:  .  losartan (COZAAR) 50 MG tablet, Take 1 tablet (50 mg total) by mouth daily., Disp: 30 tablet, Rfl: 1 .  montelukast (SINGULAIR) 10 MG tablet, Take 10 mg by mouth at bedtime as needed. , Disp: , Rfl:  .  nitroGLYCERIN (NITROSTAT) 0.4 MG SL tablet, Place 1 tablet (0.4 mg total) under the tongue every 5 (five) minutes as needed for chest pain., Disp: 25 tablet, Rfl: 3 .  pantoprazole (PROTONIX) 20 MG tablet, Take 1 tablet (20 mg total) by mouth daily., Disp: 30 tablet, Rfl: 6 .  Polyvinyl Alcohol-Povidone (REFRESH OP), Place 1 drop into both eyes 3 (three) times daily as needed (for dry eyes). , Disp: , Rfl:  .  ticagrelor (BRILINTA) 90 MG TABS  tablet, Take 1 tablet (90 mg total) by mouth 2 (two) times daily., Disp: 60 tablet, Rfl: 3  Past Medical History: Past Medical History:  Diagnosis Date  . Acute anterior wall MI ([place]) 10/2009  . CAD (coronary artery disease)    BMS to LAD (2011)  . Chest pain   . Dyslipidemia   . Hyperlipidemia   . Hypertension   . Sinus pause   . Snoring     Tobacco Use: Social History   Tobacco Use  Smoking Status Never Smoker  Smokeless Tobacco Never Used    Labs: Recent Review Flowsheet Data    Labs for ITP Cardiac and Pulmonary Rehab Latest Ref Rng & Units 07/09/2013 09/24/2013 02/07/2014 12/04/2017 12/05/2017   Cholestrol 0 - 200 mg/dL 146 130 133 183 -   LDLCALC 0 - 99 mg/dL 78 70 71 109(H) -   HDL >40 mg/dL 29(L) 39(L) 38(L) 31(L) -   Trlycerides <150 mg/dL 196(H) 104 119 215(H) -   Hemoglobin A1c <5.7 % - - - - -   TCO2 22 - 32 mmol/L - - - - 22      Capillary Blood Glucose: No results found for: GLUCAP   Exercise Target Goals: Exercise Program Goal: Individual exercise prescription set using results from initial 6 min walk test and THRR while considering  patient's activity barriers and safety.   Exercise Prescription Goal: Initial exercise prescription builds to  30-45 minutes a day of aerobic activity, 2-3 days per week.  Home exercise guidelines will be given to patient during program as part of exercise prescription that the participant will acknowledge.  Activity Barriers & Risk Stratification: Activity Barriers & Cardiac Risk Stratification - 02/22/18 0951      Activity Barriers & Cardiac Risk Stratification   Activity Barriers  Other (comment)    Comments  Life Vest     Cardiac Risk Stratification  High       6 Minute Walk: 6 Minute Walk    Row Name 02/22/18 0950         6 Minute Walk   Phase  Initial     Distance  1952 feet     Walk Time  6 minutes     # of Rest Breaks  0     MPH  3.7     METS  4.8     RPE  12     VO2 Peak  16.85     Symptoms  No      Resting HR  89 bpm     Resting BP  118/72     Resting Oxygen Saturation   100 %     Exercise Oxygen Saturation  during 6 min walk  99 %     Max Ex. HR  124 bpm     Max Ex. BP  140/80     2 Minute Post BP  124/72        Oxygen Initial Assessment:   Oxygen Re-Evaluation:   Oxygen Discharge (Final Oxygen Re-Evaluation):   Initial Exercise Prescription: Initial Exercise Prescription - 02/22/18 1000      Date of Initial Exercise RX and Referring Provider   Date  02/22/18    Referring Provider  Dr. Virgina Jock    Expected Discharge Date  06/01/18      Treadmill   MPH  3    Grade  2    Minutes  10      Recumbant Bike   Level  1.5    Watts  70    Minutes  10    METs  4.75      NuStep   Level  4    SPM  85    Minutes  10    METs  4.6      Prescription Details   Frequency (times per week)  3    Duration  Progress to 30 minutes of continuous aerobic without signs/symptoms of physical distress      Intensity   THRR 40-80% of Max Heartrate  63-126    Ratings of Perceived Exertion  11-13      Progression   Progression  Continue to progress workloads to maintain intensity without signs/symptoms of physical distress.      Resistance Training   Training Prescription  Yes    Weight  4 lbs.    Reps  10-15       Perform Capillary Blood Glucose checks as needed.  Exercise Prescription Changes:   Exercise Comments:   Exercise Goals and Review: Exercise Goals    Row Name 02/22/18 0952             Exercise Goals   Increase Physical Activity  Yes       Intervention  Provide advice, education, support and counseling about physical activity/exercise needs.;Develop an individualized exercise prescription for aerobic and resistive training based on initial evaluation findings, risk stratification, comorbidities and participant's personal goals.  Expected Outcomes  Short Term: Attend rehab on a regular basis to increase amount of physical activity.        Increase Strength and Stamina  Yes       Intervention  Provide advice, education, support and counseling about physical activity/exercise needs.;Develop an individualized exercise prescription for aerobic and resistive training based on initial evaluation findings, risk stratification, comorbidities and participant's personal goals.       Expected Outcomes  Short Term: Increase workloads from initial exercise prescription for resistance, speed, and METs.       Able to understand and use rate of perceived exertion (RPE) scale  Yes       Intervention  Provide education and explanation on how to use RPE scale       Expected Outcomes  Short Term: Able to use RPE daily in rehab to express subjective intensity level;Long Term:  Able to use RPE to guide intensity level when exercising independently       Knowledge and understanding of Target Heart Rate Range (THRR)  Yes       Intervention  Provide education and explanation of THRR including how the numbers were predicted and where they are located for reference       Expected Outcomes  Short Term: Able to state/look up THRR;Long Term: Able to use THRR to govern intensity when exercising independently;Short Term: Able to use daily as guideline for intensity in rehab       Able to check pulse independently  Yes       Intervention  Provide education and demonstration on how to check pulse in carotid and radial arteries.;Review the importance of being able to check your own pulse for safety during independent exercise       Expected Outcomes  Short Term: Able to explain why pulse checking is important during independent exercise;Long Term: Able to check pulse independently and accurately       Understanding of Exercise Prescription  Yes       Intervention  Provide education, explanation, and written materials on patient's individual exercise prescription       Expected Outcomes  Short Term: Able to explain program exercise prescription;Long Term: Able to explain  home exercise prescription to exercise independently          Exercise Goals Re-Evaluation :   Discharge Exercise Prescription (Final Exercise Prescription Changes):   Nutrition:  Target Goals: Understanding of nutrition guidelines, daily intake of sodium 1500mg , cholesterol 200mg , calories 30% from fat and 7% or less from saturated fats, daily to have 5 or more servings of fruits and vegetables.  Biometrics: Pre Biometrics - 02/22/18 0952      Pre Biometrics   Height  5' 8.5" (1.74 m)    Weight  80.6 kg    Waist Circumference  39 inches    Hip Circumference  39.5 inches    Waist to Hip Ratio  0.99 %    BMI (Calculated)  26.62    Triceps Skinfold  22 mm    % Body Fat  28.1 %    Grip Strength  40 kg    Flexibility  7 in    Single Leg Stand  30 seconds        Nutrition Therapy Plan and Nutrition Goals: Nutrition Therapy & Goals - 02/22/18 0904      Nutrition Therapy   Diet  heart healthy      Personal Nutrition Goals   Nutrition Goal  Pt to identify and limit  food sources of saturated fat, trans fat, refined carbohydrates and sodium    Personal Goal #2  Pt to identify food quantities necessary to achieve weight loss of 6-24 lbs. at graduation from cardiac rehab.    Personal Goal #3  Pt to eat more and a variety of non-starchy vegetables    Personal Goal #4  Pt to go easy on high fat cheeses.      Intervention Plan   Intervention  Prescribe, educate and counsel regarding individualized specific dietary modifications aiming towards targeted core components such as weight, hypertension, lipid management, diabetes, heart failure and other comorbidities.    Expected Outcomes  Short Term Goal: Understand basic principles of dietary content, such as calories, fat, sodium, cholesterol and nutrients.;Long Term Goal: Adherence to prescribed nutrition plan.       Nutrition Assessments: Nutrition Assessments - 02/22/18 0905      MEDFICTS Scores   Pre Score  18        Nutrition Goals Re-Evaluation:   Nutrition Goals Re-Evaluation:   Nutrition Goals Discharge (Final Nutrition Goals Re-Evaluation):   Psychosocial: Target Goals: Acknowledge presence or absence of significant depression and/or stress, maximize coping skills, provide positive support system. Participant is able to verbalize types and ability to use techniques and skills needed for reducing stress and depression.  Initial Review & Psychosocial Screening: Initial Psych Review & Screening - 02/22/18 0927      Initial Review   Current issues with  Current Stress Concerns;Current Anxiety/Panic;Current Depression    Source of Stress Concerns  Chronic Illness   Elta [place] has current concerns related to his treatment plan and the continued need for a Life Vest.      [place]?  Yes   Pt lists his wife, daughter, son, and brother as sources of support.      Barriers   Psychosocial barriers to participate in program  The patient should benefit from training in stress management and relaxation.      Screening Interventions   Interventions  Encouraged to exercise;To provide support and resources with identified psychosocial needs   Pt states that he is able to manage his stress, depression, and anxiety on his own.       Quality of Life Scores: Quality of Life - 02/22/18 0826      Quality of Life   Select  Quality of Life      Quality of Life Scores   Health/Function Pre  19.6 %    Socioeconomic Pre  23.75 %    Psych/Spiritual Pre  21.21 %    Family Pre  27.6 %    GLOBAL Pre  21.91 %      Scores of 19 and below usually indicate a poorer quality of life in these areas.  A difference of  2-3 points is a clinically meaningful difference.  A difference of 2-3 points in the total score of the Quality of Life Index has been associated with significant improvement in overall quality of life, self-image, physical symptoms, and general health in studies assessing  change in quality of life.  PHQ-9: Recent Review Flowsheet Data    There is no flowsheet data to display.     Interpretation of Total Score  Total Score Depression Severity:  1-4 = Minimal depression, 5-9 = Mild depression, 10-14 = Moderate depression, 15-19 = Moderately severe depression, 20-27 = Severe depression   Psychosocial Evaluation and Intervention:   Psychosocial Re-Evaluation:   Psychosocial Discharge (Final Psychosocial  Re-Evaluation):   Vocational Rehabilitation: Provide vocational rehab assistance to qualifying candidates.   Vocational Rehab Evaluation & Intervention: Vocational Rehab - 02/22/18 0921      Initial Vocational Rehab Evaluation & Intervention   Assessment shows need for Vocational Rehabilitation  No       Education: Education Goals: Education classes will be provided on a weekly basis, covering required topics. Participant will state understanding/return demonstration of topics presented.  Learning Barriers/Preferences: Learning Barriers/Preferences - 02/22/18 0953      Learning Barriers/Preferences   Learning Barriers  Sight    Learning Preferences  Pictoral;Video;Written Material       Education Topics: Count Your Pulse:  -Group instruction provided by verbal instruction, demonstration, patient participation and written materials to support subject.  Instructors address importance of being able to find your pulse and how to count your pulse when at home without a heart monitor.  Patients get hands on experience counting their pulse with staff help and individually.   Heart Attack, Angina, and Risk Factor Modification:  -Group instruction provided by verbal instruction, video, and written materials to support subject.  Instructors address signs and symptoms of angina and heart attacks.    Also discuss risk factors for heart disease and how to make changes to improve heart health risk factors.   Functional Fitness:  -Group instruction  provided by verbal instruction, demonstration, patient participation, and written materials to support subject.  Instructors address safety measures for doing things around the house.  Discuss how to get up and down off the floor, how to pick things up properly, how to safely get out of a chair without assistance, and balance training.   Meditation and Mindfulness:  -Group instruction provided by verbal instruction, patient participation, and written materials to support subject.  Instructor addresses importance of mindfulness and meditation practice to help reduce stress and improve awareness.  Instructor also leads participants through a meditation exercise.    Stretching for Flexibility and Mobility:  -Group instruction provided by verbal instruction, patient participation, and written materials to support subject.  Instructors lead participants through series of stretches that are designed to increase flexibility thus improving mobility.  These stretches are additional exercise for major muscle groups that are typically performed during regular warm up and cool down.   Hands Only CPR:  -Group verbal, video, and participation provides a basic overview of AHA guidelines for community CPR. Role-play of emergencies allow participants the opportunity to practice calling for help and chest compression technique with discussion of AED use.   Hypertension: -Group verbal and written instruction that provides a basic overview of hypertension including the most recent diagnostic guidelines, risk factor reduction with self-care instructions and medication management.    Nutrition I class: Heart Healthy Eating:  -Group instruction provided by PowerPoint slides, verbal discussion, and written materials to support subject matter. The instructor gives an explanation and review of the Therapeutic Lifestyle Changes diet recommendations, which includes a discussion on lipid goals, dietary fat, sodium, fiber,  plant stanol/sterol esters, sugar, and the components of a well-balanced, healthy diet.   Nutrition II class: Lifestyle Skills:  -Group instruction provided by PowerPoint slides, verbal discussion, and written materials to support subject matter. The instructor gives an explanation and review of label reading, grocery shopping for heart health, heart healthy recipe modifications, and ways to make healthier choices when eating out.   Diabetes Question & Answer:  -Group instruction provided by PowerPoint slides, verbal discussion, and written materials to support subject matter. The  instructor gives an explanation and review of diabetes co-morbidities, pre- and post-prandial blood glucose goals, pre-exercise blood glucose goals, signs, symptoms, and treatment of hypoglycemia and hyperglycemia, and foot care basics.   Diabetes Blitz:  -Group instruction provided by PowerPoint slides, verbal discussion, and written materials to support subject matter. The instructor gives an explanation and review of the physiology behind type 1 and type 2 diabetes, diabetes medications and rational behind using different medications, pre- and post-prandial blood glucose recommendations and Hemoglobin A1c goals, diabetes diet, and exercise including blood glucose guidelines for exercising safely.    Portion Distortion:  -Group instruction provided by PowerPoint slides, verbal discussion, written materials, and food models to support subject matter. The instructor gives an explanation of serving size versus portion size, changes in portions sizes over the last 20 years, and what consists of a serving from each food group.   Stress Management:  -Group instruction provided by verbal instruction, video, and written materials to support subject matter.  Instructors review role of stress in heart disease and how to cope with stress positively.     Exercising on Your Own:  -Group instruction provided by verbal  instruction, power point, and written materials to support subject.  Instructors discuss benefits of exercise, components of exercise, frequency and intensity of exercise, and end points for exercise.  Also discuss use of nitroglycerin and activating EMS.  Review options of places to exercise outside of rehab.  Review guidelines for sex with heart disease.   Cardiac Drugs I:  -Group instruction provided by verbal instruction and written materials to support subject.  Instructor reviews cardiac drug classes: antiplatelets, anticoagulants, beta blockers, and statins.  Instructor discusses reasons, side effects, and lifestyle considerations for each drug class.   Cardiac Drugs II:  -Group instruction provided by verbal instruction and written materials to support subject.  Instructor reviews cardiac drug classes: angiotensin converting enzyme inhibitors (ACE-I), angiotensin II receptor blockers (ARBs), nitrates, and calcium channel blockers.  Instructor discusses reasons, side effects, and lifestyle considerations for each drug class.   Anatomy and Physiology of the Circulatory System:  Group verbal and written instruction and models provide basic cardiac anatomy and physiology, with the coronary electrical and arterial systems. Review of: AMI, Angina, Valve disease, Heart Failure, Peripheral Artery Disease, Cardiac Arrhythmia, Pacemakers, and the ICD.   Other Education:  -Group or individual verbal, written, or video instructions that support the educational goals of the cardiac rehab program.   Holiday Eating Survival Tips:  -Group instruction provided by PowerPoint slides, verbal discussion, and written materials to support subject matter. The instructor gives patients tips, tricks, and techniques to help them not only survive but enjoy the holidays despite the onslaught of food that accompanies the holidays.   Knowledge Questionnaire Score: Knowledge Questionnaire Score - 02/22/18 5809       Knowledge Questionnaire Score   Pre Score  23/24       Core Components/Risk Factors/Patient Goals at Admission: Personal Goals and Risk Factors at Admission - 02/22/18 0954      Core Components/Risk Factors/Patient Goals on Admission    Weight Management  Yes;Weight Maintenance    Intervention  Weight Management: Develop a combined nutrition and exercise program designed to reach desired caloric intake, while maintaining appropriate intake of nutrient and fiber, sodium and fats, and appropriate energy expenditure required for the weight goal.;Weight Management: Provide education and appropriate resources to help participant work on and attain dietary goals.;Weight Management/Obesity: Establish reasonable short term and long term weight  goals.    Admit Weight  177 lb 11.1 oz (80.6 kg)    Expected Outcomes  Short Term: Continue to assess and modify interventions until short term weight is achieved;Long Term: Adherence to nutrition and physical activity/exercise program aimed toward attainment of established weight goal;Weight Maintenance: Understanding of the daily nutrition guidelines, which includes 25-35% calories from fat, 7% or less cal from saturated fats, less than 200mg  cholesterol, less than 1.5gm of sodium, & 5 or more servings of fruits and vegetables daily;Understanding recommendations for meals to include 15-35% energy as protein, 25-35% energy from fat, 35-60% energy from carbohydrates, less than 200mg  of dietary cholesterol, 20-35 gm of total fiber daily;Understanding of distribution of calorie intake throughout the day with the consumption of 4-5 meals/snacks    Hypertension  Yes    Intervention  Provide education on lifestyle modifcations including regular physical activity/exercise, weight management, moderate sodium restriction and increased consumption of fresh fruit, vegetables, and low fat dairy, alcohol moderation, and smoking cessation.;Monitor prescription use compliance.     Expected Outcomes  Short Term: Continued assessment and intervention until BP is < 140/84mm HG in hypertensive participants. < 130/72mm HG in hypertensive participants with diabetes, heart failure or chronic kidney disease.;Long Term: Maintenance of blood pressure at goal levels.    Lipids  Yes    Intervention  Provide education and support for participant on nutrition & aerobic/resistive exercise along with prescribed medications to achieve LDL 70mg , HDL >40mg .    Expected Outcomes  Short Term: Participant states understanding of desired cholesterol values and is compliant with medications prescribed. Participant is following exercise prescription and nutrition guidelines.;Long Term: Cholesterol controlled with medications as prescribed, with individualized exercise RX and with personalized nutrition plan. Value goals: LDL < 70mg , HDL > 40 mg.    Stress  Yes    Intervention  Offer individual and/or small group education and counseling on adjustment to heart disease, stress management and health-related lifestyle change. Teach and support self-help strategies.;Refer participants experiencing significant psychosocial distress to appropriate mental health specialists for further evaluation and treatment. When possible, include family members and significant others in education/counseling sessions.    Expected Outcomes  Short Term: Participant demonstrates changes in health-related behavior, relaxation and other stress management skills, ability to obtain effective social support, and compliance with psychotropic medications if prescribed.;Long Term: Emotional wellbeing is indicated by absence of clinically significant psychosocial distress or social isolation.       Core Components/Risk Factors/Patient Goals Review:    Core Components/Risk Factors/Patient Goals at Discharge (Final Review):    ITP Comments: ITP Comments    Row Name 02/22/18 0949           ITP Comments  Dr. Fransico Him, Medical  Director          Comments:Mark attended orientation from [phone] to [phone] to review rules and guidelines for program. Completed 6 minute walk test, Intitial ITP, and exercise prescription.  VSS. Telemetry-Sinus Rhythm.  Asymptomatic.Barnet Pall, RN,BSN 02/22/2018 11:11 AM 02/22/2018 11:08 AM

## 2018-02-26 ENCOUNTER — Ambulatory Visit (HOSPITAL_COMMUNITY): Payer: BLUE CROSS/BLUE SHIELD

## 2018-02-26 ENCOUNTER — Encounter (HOSPITAL_COMMUNITY)
Admission: RE | Admit: 2018-02-26 | Discharge: 2018-02-26 | Disposition: A | Discharge status: Home / Self Care | Payer: BLUE CROSS/BLUE SHIELD | Source: Ambulatory Visit | Attending: Cardiology | Admitting: Cardiology

## 2018-02-26 ENCOUNTER — Encounter (HOSPITAL_COMMUNITY): Payer: Self-pay

## 2018-02-26 DIAGNOSIS — I2102 ST elevation (STEMI) myocardial infarction involving left anterior descending coronary artery: Secondary | ICD-10-CM | POA: Diagnosis not present

## 2018-02-26 DIAGNOSIS — Z955 Presence of coronary angioplasty implant and graft: Secondary | ICD-10-CM | POA: Diagnosis not present

## 2018-02-26 DIAGNOSIS — Z7982 Long term (current) use of aspirin: Secondary | ICD-10-CM | POA: Diagnosis not present

## 2018-02-26 DIAGNOSIS — I10 Essential (primary) hypertension: Secondary | ICD-10-CM | POA: Diagnosis not present

## 2018-02-26 DIAGNOSIS — E785 Hyperlipidemia, unspecified: Secondary | ICD-10-CM | POA: Diagnosis not present

## 2018-02-26 DIAGNOSIS — I2510 Atherosclerotic heart disease of native coronary artery without angina pectoris: Secondary | ICD-10-CM | POA: Diagnosis not present

## 2018-02-26 DIAGNOSIS — Z79890 Hormone replacement therapy (postmenopausal): Secondary | ICD-10-CM | POA: Diagnosis not present

## 2018-02-26 DIAGNOSIS — Z79899 Other long term (current) drug therapy: Secondary | ICD-10-CM | POA: Diagnosis not present

## 2018-02-26 NOTE — Progress Notes (Signed)
Daily Session Note  Patient Details  Name: Michael Terrell MRN: [id number] Date of Birth: 1955/11/08 Referring Provider:   Flowsheet Row CARDIAC REHAB PHASE II ORIENTATION from 02/22/2018 in [place]  Referring Provider  Dr. Virgina Jock      Encounter Date: 02/26/2018  Check In: Session Check In - 02/26/18 0848    Check-In          Supervising physician immediately available to respond to emergencies  [facility] immediately available    Physician(s)  Dr. Lonny Prude    Location  [facility]    Staff Present  Hoy Register, MS, Exercise Physiologist;Tyara Carol Ada, MS,ACSM CEP, Exercise Physiologist;Jonasia Coiner, RN, Toma Deiters, RN, BSN    Medication changes reported      No    Fall or balance concerns reported     No    Tobacco Cessation  No Change    Warm-up and Cool-down  Performed as group-led instruction    Resistance Training Performed  Yes    VAD Patient?  No    PAD/SET Patient?  No        Pain Assessment          Currently in Pain?  No/denies    Multiple Pain Sites  No           Capillary Blood Glucose: No results found for this or any previous visit (from the past 24 hour(s)).  Exercise Prescription Changes - 02/26/18 1200    Response to Exercise          Blood Pressure (Admit)  116/78    Blood Pressure (Exercise)  142/68    Blood Pressure (Exit)  108/72    Heart Rate (Admit)  92 bpm    Heart Rate (Exercise)  126 bpm    Heart Rate (Exit)  92 bpm    Rating of Perceived Exertion (Exercise)  13    Comments  Pt first day of exercise    Duration  Progress to 30 minutes of  aerobic without signs/symptoms of physical distress    Intensity  THRR unchanged        Progression          Progression  Continue to progress workloads to maintain intensity without signs/symptoms of physical distress.    Average METs  3.06        Resistance Training          Training Prescription  Yes    Weight  4 lbs.     Reps  10-15    Time  10 Minutes        Treadmill          MPH  3    Grade  2    Minutes  10        Recumbant Bike          Level  1.5    Watts  70    Minutes  10    METs  4.75        NuStep          Level  4    SPM  85    Minutes  10    METs  4.6           Social History   Tobacco Use  Smoking Status Never Smoker  Smokeless Tobacco Never Used    Goals Met:  Exercise tolerated well  Goals Unmet:  Not Applicable  Comments: Pt started cardiac rehab today.  Pt tolerated light exercise without difficulty. VSS, telemetry-sinus rhythm, asymptomatic.  Medication list reconciled. Pt denies barriers to medicaiton compliance.  PSYCHOSOCIAL ASSESSMENT:  PHQ-9.   Pt has discussed with PCP and is considering medication. Pt . Pt exhibits positive coping skills, hopeful outlook with supportive family. No psychosocial needs identified at this time, no psychosocial interventions necessary.    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    Pt oriented to exercise equipment and routine.    Understanding verbalized.  Andi Hence, RN, BSN Cardiac Pulmonary Rehab    Dr. Fransico Him is Medical Director for Cardiac Rehab at [facility].

## 2018-03-02 ENCOUNTER — Ambulatory Visit (HOSPITAL_COMMUNITY): Payer: BLUE CROSS/BLUE SHIELD

## 2018-03-02 ENCOUNTER — Encounter (HOSPITAL_COMMUNITY)
Admission: RE | Admit: 2018-03-02 | Discharge: 2018-03-02 | Disposition: A | Discharge status: Home / Self Care | Payer: BLUE CROSS/BLUE SHIELD | Source: Ambulatory Visit | Attending: Cardiology | Admitting: Cardiology

## 2018-03-02 DIAGNOSIS — Z7982 Long term (current) use of aspirin: Secondary | ICD-10-CM | POA: Insufficient documentation

## 2018-03-02 DIAGNOSIS — Z955 Presence of coronary angioplasty implant and graft: Secondary | ICD-10-CM | POA: Diagnosis not present

## 2018-03-02 DIAGNOSIS — Z79899 Other long term (current) drug therapy: Secondary | ICD-10-CM | POA: Diagnosis not present

## 2018-03-02 DIAGNOSIS — I2510 Atherosclerotic heart disease of native coronary artery without angina pectoris: Secondary | ICD-10-CM | POA: Diagnosis not present

## 2018-03-02 DIAGNOSIS — Z79890 Hormone replacement therapy (postmenopausal): Secondary | ICD-10-CM | POA: Insufficient documentation

## 2018-03-02 DIAGNOSIS — E785 Hyperlipidemia, unspecified: Secondary | ICD-10-CM | POA: Insufficient documentation

## 2018-03-02 DIAGNOSIS — I10 Essential (primary) hypertension: Secondary | ICD-10-CM | POA: Diagnosis not present

## 2018-03-02 DIAGNOSIS — I2102 ST elevation (STEMI) myocardial infarction involving left anterior descending coronary artery: Secondary | ICD-10-CM

## 2018-03-05 ENCOUNTER — Ambulatory Visit (HOSPITAL_COMMUNITY): Payer: BLUE CROSS/BLUE SHIELD

## 2018-03-05 ENCOUNTER — Encounter (HOSPITAL_COMMUNITY)
Admission: RE | Admit: 2018-03-05 | Discharge: 2018-03-05 | Disposition: A | Discharge status: Home / Self Care | Payer: BLUE CROSS/BLUE SHIELD | Source: Ambulatory Visit | Attending: Cardiology | Admitting: Cardiology

## 2018-03-05 DIAGNOSIS — Z955 Presence of coronary angioplasty implant and graft: Secondary | ICD-10-CM | POA: Diagnosis not present

## 2018-03-05 DIAGNOSIS — E785 Hyperlipidemia, unspecified: Secondary | ICD-10-CM | POA: Diagnosis not present

## 2018-03-05 DIAGNOSIS — I2102 ST elevation (STEMI) myocardial infarction involving left anterior descending coronary artery: Secondary | ICD-10-CM | POA: Diagnosis not present

## 2018-03-05 DIAGNOSIS — I10 Essential (primary) hypertension: Secondary | ICD-10-CM | POA: Diagnosis not present

## 2018-03-05 DIAGNOSIS — Z79890 Hormone replacement therapy (postmenopausal): Secondary | ICD-10-CM | POA: Diagnosis not present

## 2018-03-05 DIAGNOSIS — Z79899 Other long term (current) drug therapy: Secondary | ICD-10-CM | POA: Diagnosis not present

## 2018-03-05 DIAGNOSIS — Z7982 Long term (current) use of aspirin: Secondary | ICD-10-CM | POA: Diagnosis not present

## 2018-03-05 DIAGNOSIS — I2510 Atherosclerotic heart disease of native coronary artery without angina pectoris: Secondary | ICD-10-CM | POA: Diagnosis not present

## 2018-03-07 ENCOUNTER — Ambulatory Visit (HOSPITAL_COMMUNITY): Payer: BLUE CROSS/BLUE SHIELD

## 2018-03-07 ENCOUNTER — Encounter (HOSPITAL_COMMUNITY): Payer: Self-pay

## 2018-03-07 ENCOUNTER — Other Ambulatory Visit: Payer: Self-pay | Admitting: *Deleted

## 2018-03-07 ENCOUNTER — Encounter (HOSPITAL_COMMUNITY)
Admission: RE | Admit: 2018-03-07 | Discharge: 2018-03-07 | Disposition: A | Discharge status: Home / Self Care | Payer: BLUE CROSS/BLUE SHIELD | Source: Ambulatory Visit | Attending: Cardiology | Admitting: Cardiology

## 2018-03-07 DIAGNOSIS — I2102 ST elevation (STEMI) myocardial infarction involving left anterior descending coronary artery: Secondary | ICD-10-CM

## 2018-03-07 DIAGNOSIS — I255 Ischemic cardiomyopathy: Secondary | ICD-10-CM

## 2018-03-07 DIAGNOSIS — Z79890 Hormone replacement therapy (postmenopausal): Secondary | ICD-10-CM | POA: Diagnosis not present

## 2018-03-07 DIAGNOSIS — E785 Hyperlipidemia, unspecified: Secondary | ICD-10-CM | POA: Diagnosis not present

## 2018-03-07 DIAGNOSIS — I10 Essential (primary) hypertension: Secondary | ICD-10-CM | POA: Diagnosis not present

## 2018-03-07 DIAGNOSIS — Z955 Presence of coronary angioplasty implant and graft: Secondary | ICD-10-CM

## 2018-03-07 DIAGNOSIS — Z7982 Long term (current) use of aspirin: Secondary | ICD-10-CM | POA: Diagnosis not present

## 2018-03-07 DIAGNOSIS — Z79899 Other long term (current) drug therapy: Secondary | ICD-10-CM | POA: Diagnosis not present

## 2018-03-07 DIAGNOSIS — I2510 Atherosclerotic heart disease of native coronary artery without angina pectoris: Secondary | ICD-10-CM | POA: Diagnosis not present

## 2018-03-07 NOTE — Progress Notes (Signed)
Daily Session Note  Patient Details  Name: Michael Terrell MRN: [id number] Date of Birth: Apr 29, 1955 Referring Provider:   Flowsheet Row CARDIAC REHAB PHASE II ORIENTATION from 02/22/2018 in [place]  Referring Provider  Dr. Virgina Jock      Encounter Date: 03/07/2018  Check In: Session Check In - 03/07/18 0906    Check-In          Supervising physician immediately available to respond to emergencies  [facility] immediately available    Physician(s)  Dr. Reesa Chew    Location  [facility]    Staff Present  Andi Hence, RN, Mosie Epstein, MS,ACSM CEP, Exercise Physiologist;Dalton Kris Mouton, MS, Exercise Physiologist;Olinty Celesta Aver, MS, ACSM CEP, Exercise Physiologist    Medication changes reported      No    Fall or balance concerns reported     No    Tobacco Cessation  No Change    Warm-up and Cool-down  Performed as group-led instruction    Resistance Training Performed  No    VAD Patient?  No    PAD/SET Patient?  No        Pain Assessment          Currently in Pain?  No/denies    Multiple Pain Sites  No           Capillary Blood Glucose: No results found for this or any previous visit (from the past 24 hour(s)).    Social History   Tobacco Use  Smoking Status Never Smoker  Smokeless Tobacco Never Used    Goals Met:  Exercise tolerated well  Goals Unmet:  Not Applicable  Comments: pt continues with lifevest therapy. Most recent repeat echo 01/2018.  Pt unsure of treatment plan and is awaiting scheduling MRI to verify EF.  Pt continues to wear lifevest. PC to Dr. Virgina Jock office to verify MRI schedule and planned duration for lifevest therapy.  Spoke to Dr. Bonney Roussel nurse.  He is out of office today. She will review with him and further notify us and pt with Dr. Ardelle Park recommendations.  Pt verbalized understanding.  Andi Hence, RN, BSN Cardiac Pulmonary Rehab 03/07/18 5:26 PM    Dr. Fransico Him is Medical Director for Cardiac Rehab at [facility].

## 2018-03-07 NOTE — Progress Notes (Signed)
Cardiac Individual Treatment Plan  Patient Details  Name: Michael Terrell MRN: [id number] Date of Birth: [birth year] Referring Provider:   Flowsheet Row CARDIAC REHAB PHASE II ORIENTATION from 02/22/2018 in [place]  Referring Provider  Dr. Virgina Jock      Initial Encounter Date:  Flowsheet Row CARDIAC REHAB PHASE II ORIENTATION from 02/22/2018 in [place]  Date  02/22/18      Visit Diagnosis: Status post coronary artery stent placement, S/P DES LAD 12/04/17  STEMI involving left anterior descending coronary artery ([place]) 12/04/17  Patient's Home Medications on Admission:  Current Outpatient Medications:  .  aspirin 81 MG tablet, Take 81 mg by mouth daily. , Disp: , Rfl:  .  cetirizine (ZYRTEC) 10 MG tablet, Take 10 mg by mouth daily as needed for allergies. , Disp: , Rfl:  .  Cholecalciferol (VITAMIN D-3 PO), Take 2,500 Units by mouth daily., Disp: , Rfl:  .  Evolocumab (REPATHA SURECLICK) 588 MG/ML SOAJ, Inject 140 mg into the skin every 14 (fourteen) days., Disp: , Rfl:  .  isosorbide-hydrALAZINE (BIDIL) 20-37.5 MG tablet, Take 1 tablet by mouth 2 (two) times daily., Disp: , Rfl:  .  levothyroxine (SYNTHROID, LEVOTHROID) 75 MCG tablet, Take 75 mcg by mouth daily before breakfast., Disp: , Rfl:  .  losartan (COZAAR) 50 MG tablet, Take 1 tablet (50 mg total) by mouth daily., Disp: 30 tablet, Rfl: 1 .  montelukast (SINGULAIR) 10 MG tablet, Take 10 mg by mouth at bedtime as needed. , Disp: , Rfl:  .  nitroGLYCERIN (NITROSTAT) 0.4 MG SL tablet, Place 1 tablet (0.4 mg total) under the tongue every 5 (five) minutes as needed for chest pain., Disp: 25 tablet, Rfl: 3 .  pantoprazole (PROTONIX) 20 MG tablet, Take 1 tablet (20 mg total) by mouth daily., Disp: 30 tablet, Rfl: 6 .  Polyvinyl Alcohol-Povidone (REFRESH OP), Place 1 drop into both eyes 3 (three) times daily as needed (for dry eyes). , Disp: , Rfl:  .   ticagrelor (BRILINTA) 90 MG TABS tablet, Take 1 tablet (90 mg total) by mouth 2 (two) times daily., Disp: 60 tablet, Rfl: 3  Past Medical History: Past Medical History:  Diagnosis Date  . Acute anterior wall MI ([place]) 10/2009  . CAD (coronary artery disease)    BMS to LAD (2011)  . Chest pain   . Dyslipidemia   . Hyperlipidemia   . Hypertension   . Sinus pause   . Snoring     Tobacco Use: Social History   Tobacco Use  Smoking Status Never Smoker  Smokeless Tobacco Never Used    Labs: Recent Review Flowsheet Data    Labs for ITP Cardiac and Pulmonary Rehab Latest Ref Rng & Units 07/09/2013 09/24/2013 02/07/2014 12/04/2017 12/05/2017   Cholestrol 0 - 200 mg/dL 146 130 133 183 -   LDLCALC 0 - 99 mg/dL 78 70 71 109(H) -   HDL >40 mg/dL 29(L) 39(L) 38(L) 31(L) -   Trlycerides <150 mg/dL 196(H) 104 119 215(H) -   Hemoglobin A1c <5.7 % - - - - -   TCO2 22 - 32 mmol/L - - - - 22      Capillary Blood Glucose: No results found for: GLUCAP   Exercise Target Goals: Exercise Program Goal: Individual exercise prescription set using results from initial 6 min walk test and THRR while considering  patient's activity barriers and safety.   Exercise Prescription Goal: Initial exercise prescription builds to  30-45 minutes a day of aerobic activity, 2-3 days per week.  Home exercise guidelines will be given to patient during program as part of exercise prescription that the participant will acknowledge.  Activity Barriers & Risk Stratification: Activity Barriers & Cardiac Risk Stratification - 02/22/18 0951    Activity Barriers & Cardiac Risk Stratification          Activity Barriers  Other (comment)    Comments  Life Vest     Cardiac Risk Stratification  High           6 Minute Walk: 6 Minute Walk    6 Minute Walk    Row Name 02/22/18 0950   Phase  Initial   Distance  1952 feet   Walk Time  6 minutes   # of Rest Breaks  0   MPH  3.7   METS  4.8   RPE  12   VO2 Peak  16.85    Symptoms  No   Resting HR  89 bpm   Resting BP  118/72   Resting Oxygen Saturation   100 %   Exercise Oxygen Saturation  during 6 min walk  99 %   Max Ex. HR  124 bpm   Max Ex. BP  140/80   2 Minute Post BP  124/72          Oxygen Initial Assessment:   Oxygen Re-Evaluation:   Oxygen Discharge (Final Oxygen Re-Evaluation):   Initial Exercise Prescription: Initial Exercise Prescription - 02/22/18 1000    Date of Initial Exercise RX and Referring Provider          Date  02/22/18    Referring Provider  Dr. Virgina Jock    Expected Discharge Date  06/01/18        Treadmill          MPH  3    Grade  2    Minutes  10        Recumbant Bike          Level  1.5    Watts  70    Minutes  10    METs  4.75        NuStep          Level  4    SPM  85    Minutes  10    METs  4.6        Prescription Details          Frequency (times per week)  3    Duration  Progress to 30 minutes of continuous aerobic without signs/symptoms of physical distress        Intensity          THRR 40-80% of Max Heartrate  63-126    Ratings of Perceived Exertion  11-13        Progression          Progression  Continue to progress workloads to maintain intensity without signs/symptoms of physical distress.        Resistance Training          Training Prescription  Yes    Weight  4 lbs.    Reps  10-15           Perform Capillary Blood Glucose checks as needed.  Exercise Prescription Changes: Exercise Prescription Changes    Response to Exercise    Row Name 02/26/18 1200   Blood Pressure (Admit)  116/78   Blood Pressure (Exercise)  142/68  Blood Pressure (Exit)  108/72   Heart Rate (Admit)  92 bpm   Heart Rate (Exercise)  126 bpm   Heart Rate (Exit)  92 bpm   Rating of Perceived Exertion (Exercise)  13   Comments  Pt first day of exercise   Duration  Progress to 30 minutes of  aerobic without signs/symptoms of physical distress   Intensity  THRR unchanged        Progression    Row Name 02/26/18 1200   Progression  Continue to progress workloads to maintain intensity without signs/symptoms of physical distress.   Average METs  3.06       Resistance Training    Row Name 02/26/18 1200   Training Prescription  Yes   Weight  4 lbs.   Reps  10-15   Time  10 Minutes       Treadmill    Row Name 02/26/18 1200   MPH  3   Grade  2   Minutes  [place] Name 02/26/18 1200   Level  1.5   Watts  70   Minutes  10   METs  4.75       NuStep    Row Name 02/26/18 1200   Level  4   SPM  85   Minutes  10   METs  4.6          Exercise Comments: Exercise Comments    Row Name 02/26/18 1238   Exercise Comments  Pt first day of exercise. Tolerated well.       Exercise Goals and Review: Exercise Goals    Exercise Goals    Row Name 02/22/18 0952   Increase Physical Activity  Yes   Intervention  Provide advice, education, support and counseling about physical activity/exercise needs.;Develop an individualized exercise prescription for aerobic and resistive training based on initial evaluation findings, risk stratification, comorbidities and participant's personal goals.   Expected Outcomes  Short Term: Attend rehab on a regular basis to increase amount of physical activity.   Increase Strength and Stamina  Yes   Intervention  Provide advice, education, support and counseling about physical activity/exercise needs.;Develop an individualized exercise prescription for aerobic and resistive training based on initial evaluation findings, risk stratification, comorbidities and participant's personal goals.   Expected Outcomes  Short Term: Increase workloads from initial exercise prescription for resistance, speed, and METs.   Able to understand and use rate of perceived exertion (RPE) scale  Yes   Intervention  Provide education and explanation on how to use RPE scale   Expected Outcomes  Short Term: Able to use RPE daily in  rehab to express subjective intensity level;Long Term:  Able to use RPE to guide intensity level when exercising independently   Knowledge and understanding of Target Heart Rate Range (THRR)  Yes   Intervention  Provide education and explanation of THRR including how the numbers were predicted and where they are located for reference   Expected Outcomes  Short Term: Able to state/look up THRR;Long Term: Able to use THRR to govern intensity when exercising independently;Short Term: Able to use daily as guideline for intensity in rehab   Able to check pulse independently  Yes   Intervention  Provide education and demonstration on how to check pulse in carotid and radial arteries.;Review the importance of being able to check your own pulse for safety during independent exercise   Expected Outcomes  Short  Term: Able to explain why pulse checking is important during independent exercise;Long Term: Able to check pulse independently and accurately   Understanding of Exercise Prescription  Yes   Intervention  Provide education, explanation, and written materials on patient's individual exercise prescription   Expected Outcomes  Short Term: Able to explain program exercise prescription;Long Term: Able to explain home exercise prescription to exercise independently          Exercise Goals Re-Evaluation : Exercise Goals Re-Evaluation    Exercise Goal Re-Evaluation    Row Name 02/26/18 1237   Exercise Goals Review  Increase Physical Activity;Increase Strength and Stamina;Able to check pulse independently;Knowledge and understanding of Target Heart Rate Range (THRR);Able to understand and use rate of perceived exertion (RPE) scale;Understanding of Exercise Prescription   Comments  Pt first day of exercise. Tolerated exercise well. Will progress as tolerated.    Expected Outcomes  Will monitor and progress Pt as tolerated.           Discharge Exercise Prescription (Final Exercise Prescription  Changes): Exercise Prescription Changes - 02/26/18 1200    Response to Exercise          Blood Pressure (Admit)  116/78    Blood Pressure (Exercise)  142/68    Blood Pressure (Exit)  108/72    Heart Rate (Admit)  92 bpm    Heart Rate (Exercise)  126 bpm    Heart Rate (Exit)  92 bpm    Rating of Perceived Exertion (Exercise)  13    Comments  Pt first day of exercise    Duration  Progress to 30 minutes of  aerobic without signs/symptoms of physical distress    Intensity  THRR unchanged        Progression          Progression  Continue to progress workloads to maintain intensity without signs/symptoms of physical distress.    Average METs  3.06        Resistance Training          Training Prescription  Yes    Weight  4 lbs.    Reps  10-15    Time  10 Minutes        Treadmill          MPH  3    Grade  2    Minutes  10        Recumbant Bike          Level  1.5    Watts  70    Minutes  10    METs  4.75        NuStep          Level  4    SPM  85    Minutes  10    METs  4.6           Nutrition:  Target Goals: Understanding of nutrition guidelines, daily intake of sodium 1500mg , cholesterol 200mg , calories 30% from fat and 7% or less from saturated fats, daily to have 5 or more servings of fruits and vegetables.  Biometrics: Pre Biometrics - 02/22/18 0952    Pre Biometrics          Height  5' 8.5" (1.74 m)    Weight  80.6 kg    Waist Circumference  39 inches    Hip Circumference  39.5 inches    Waist to Hip Ratio  0.99 %    BMI (Calculated)  26.62    Triceps Skinfold  22 mm    % Body Fat  28.1 %    Grip Strength  40 kg    Flexibility  7 in    Single Leg Stand  30 seconds            Nutrition Therapy Plan and Nutrition Goals: Nutrition Therapy & Goals - 02/22/18 0904    Nutrition Therapy          Diet  heart healthy        Personal Nutrition Goals          Nutrition Goal  Pt to identify and limit food sources of saturated fat, trans  fat, refined carbohydrates and sodium    Personal Goal #2  Pt to identify food quantities necessary to achieve weight loss of 6-24 lbs. at graduation from cardiac rehab.    Personal Goal #3  Pt to eat more and a variety of non-starchy vegetables    Personal Goal #4  Pt to go easy on high fat cheeses.        Intervention Plan          Intervention  Prescribe, educate and counsel regarding individualized specific dietary modifications aiming towards targeted core components such as weight, hypertension, lipid management, diabetes, heart failure and other comorbidities.    Expected Outcomes  Short Term Goal: Understand basic principles of dietary content, such as calories, fat, sodium, cholesterol and nutrients.;Long Term Goal: Adherence to prescribed nutrition plan.           Nutrition Assessments: Nutrition Assessments - 02/22/18 0905    MEDFICTS Scores          Pre Score  18           Nutrition Goals Re-Evaluation:   Nutrition Goals Re-Evaluation:   Nutrition Goals Discharge (Final Nutrition Goals Re-Evaluation):   Psychosocial: Target Goals: Acknowledge presence or absence of significant depression and/or stress, maximize coping skills, provide positive support system. Participant is able to verbalize types and ability to use techniques and skills needed for reducing stress and depression.  Initial Review & Psychosocial Screening: Initial Psych Review & Screening - 02/22/18 0927    Initial Review          Current issues with  Current Stress Concerns;Current Anxiety/Panic;Current Depression    Source of Stress Concerns  Chronic Illness   Elta [place] has current concerns related to his treatment plan and the continued need for a Life Vest.        Ida?  Yes   Pt lists his wife, daughter, son, and brother as sources of support.        Barriers          Psychosocial barriers to participate in program  The patient should benefit from  training in stress management and relaxation.        Screening Interventions          Interventions  Encouraged to exercise;To provide support and resources with identified psychosocial needs   Pt states that he is able to manage his stress, depression, and anxiety on his own.           Quality of Life Scores: Quality of Life - 02/22/18 0826    Quality of Life          Select  Quality of Life        Quality of Life Scores          Health/Function  Pre  19.6 %    Socioeconomic Pre  23.75 %    Psych/Spiritual Pre  21.21 %    Family Pre  27.6 %    GLOBAL Pre  21.91 %          Scores of 19 and below usually indicate a poorer quality of life in these areas.  A difference of  2-3 points is a clinically meaningful difference.  A difference of 2-3 points in the total score of the Quality of Life Index has been associated with significant improvement in overall quality of life, self-image, physical symptoms, and general health in studies assessing change in quality of life.  PHQ-9: Recent Review Flowsheet Data    Depression screen [facility] 2/9 02/26/2018   Decreased Interest 1   Down, Depressed, Hopeless 2   PHQ - 2 Score 3   Altered sleeping 2   Tired, decreased energy 2   Change in appetite 0   Feeling bad or failure about yourself  1   Trouble concentrating 0   Moving slowly or fidgety/restless 1   Suicidal thoughts 0   PHQ-9 Score 9   Difficult doing work/chores Somewhat difficult     Interpretation of Total Score  Total Score Depression Severity:  1-4 = Minimal depression, 5-9 = Mild depression, 10-14 = Moderate depression, 15-19 = Moderately severe depression, 20-27 = Severe depression   Psychosocial Evaluation and Intervention: Psychosocial Evaluation - 02/26/18 0917    Psychosocial Evaluation & Interventions          Interventions  Stress management education;Encouraged to exercise with the program and follow exercise prescription;Relaxation education    Comments   pt with health related anxiety demonstrates associated depression symptoms. appt scheduled with Jeanella Craze 03/07/2018 @ 9:30.  pt enjoys coaching elementary cross country, reading and listening to music.     Expected Outcomes  pt will exhibit improved depression symptoms, good coping skills and positive outlook.     Continue Psychosocial Services   Follow up required by staff           Psychosocial Re-Evaluation:   Psychosocial Discharge (Final Psychosocial Re-Evaluation):   Vocational Rehabilitation: Provide vocational rehab assistance to qualifying candidates.   Vocational Rehab Evaluation & Intervention: Vocational Rehab - 02/22/18 0921    Initial Vocational Rehab Evaluation & Intervention          Assessment shows need for Vocational Rehabilitation  No           Education: Education Goals: Education classes will be provided on a weekly basis, covering required topics. Participant will state understanding/return demonstration of topics presented.  Learning Barriers/Preferences: Learning Barriers/Preferences - 02/22/18 0953    Learning Barriers/Preferences          Learning Barriers  Sight    Learning Preferences  Pictoral;Video;Written Material           Education Topics: Count Your Pulse:  -Group instruction provided by verbal instruction, demonstration, patient participation and written materials to support subject.  Instructors address importance of being able to find your pulse and how to count your pulse when at home without a heart monitor.  Patients get hands on experience counting their pulse with staff help and individually.   Heart Attack, Angina, and Risk Factor Modification:  -Group instruction provided by verbal instruction, video, and written materials to support subject.  Instructors address signs and symptoms of angina and heart attacks.    Also discuss risk factors for heart disease and how to make changes to  improve heart health risk  factors.   Functional Fitness:  -Group instruction provided by verbal instruction, demonstration, patient participation, and written materials to support subject.  Instructors address safety measures for doing things around the house.  Discuss how to get up and down off the floor, how to pick things up properly, how to safely get out of a chair without assistance, and balance training.   Meditation and Mindfulness:  -Group instruction provided by verbal instruction, patient participation, and written materials to support subject.  Instructor addresses importance of mindfulness and meditation practice to help reduce stress and improve awareness.  Instructor also leads participants through a meditation exercise.    Stretching for Flexibility and Mobility:  -Group instruction provided by verbal instruction, patient participation, and written materials to support subject.  Instructors lead participants through series of stretches that are designed to increase flexibility thus improving mobility.  These stretches are additional exercise for major muscle groups that are typically performed during regular warm up and cool down.   Hands Only CPR:  -Group verbal, video, and participation provides a basic overview of AHA guidelines for community CPR. Role-play of emergencies allow participants the opportunity to practice calling for help and chest compression technique with discussion of AED use.   Hypertension: -Group verbal and written instruction that provides a basic overview of hypertension including the most recent diagnostic guidelines, risk factor reduction with self-care instructions and medication management. Flowsheet Row CARDIAC REHAB PHASE II EXERCISE from 03/07/2018 in [place]  Date  03/02/18  Educator  RN  Instruction Review Code  2- Demonstrated Understanding       Nutrition I class: Heart Healthy Eating:  -Group instruction provided by PowerPoint  slides, verbal discussion, and written materials to support subject matter. The instructor gives an explanation and review of the Therapeutic Lifestyle Changes diet recommendations, which includes a discussion on lipid goals, dietary fat, sodium, fiber, plant stanol/sterol esters, sugar, and the components of a well-balanced, healthy diet.   Nutrition II class: Lifestyle Skills:  -Group instruction provided by PowerPoint slides, verbal discussion, and written materials to support subject matter. The instructor gives an explanation and review of label reading, grocery shopping for heart health, heart healthy recipe modifications, and ways to make healthier choices when eating out.   Diabetes Question & Answer:  -Group instruction provided by PowerPoint slides, verbal discussion, and written materials to support subject matter. The instructor gives an explanation and review of diabetes co-morbidities, pre- and post-prandial blood glucose goals, pre-exercise blood glucose goals, signs, symptoms, and treatment of hypoglycemia and hyperglycemia, and foot care basics.   Diabetes Blitz:  -Group instruction provided by PowerPoint slides, verbal discussion, and written materials to support subject matter. The instructor gives an explanation and review of the physiology behind type 1 and type 2 diabetes, diabetes medications and rational behind using different medications, pre- and post-prandial blood glucose recommendations and Hemoglobin A1c goals, diabetes diet, and exercise including blood glucose guidelines for exercising safely.    Portion Distortion:  -Group instruction provided by PowerPoint slides, verbal discussion, written materials, and food models to support subject matter. The instructor gives an explanation of serving size versus portion size, changes in portions sizes over the last 20 years, and what consists of a serving from each food group.   Stress Management:  -Group instruction  provided by verbal instruction, video, and written materials to support subject matter.  Instructors review role of stress in heart disease and how to cope  with stress positively.     Exercising on Your Own:  -Group instruction provided by verbal instruction, power point, and written materials to support subject.  Instructors discuss benefits of exercise, components of exercise, frequency and intensity of exercise, and end points for exercise.  Also discuss use of nitroglycerin and activating EMS.  Review options of places to exercise outside of rehab.  Review guidelines for sex with heart disease.   Cardiac Drugs I:  -Group instruction provided by verbal instruction and written materials to support subject.  Instructor reviews cardiac drug classes: antiplatelets, anticoagulants, beta blockers, and statins.  Instructor discusses reasons, side effects, and lifestyle considerations for each drug class. Flowsheet Row CARDIAC REHAB PHASE II EXERCISE from 03/07/2018 in [place]  Date  03/07/18  Instruction Review Code  2- Demonstrated Understanding      Cardiac Drugs II:  -Group instruction provided by verbal instruction and written materials to support subject.  Instructor reviews cardiac drug classes: angiotensin converting enzyme inhibitors (ACE-I), angiotensin II receptor blockers (ARBs), nitrates, and calcium channel blockers.  Instructor discusses reasons, side effects, and lifestyle considerations for each drug class.   Anatomy and Physiology of the Circulatory System:  Group verbal and written instruction and models provide basic cardiac anatomy and physiology, with the coronary electrical and arterial systems. Review of: AMI, Angina, Valve disease, Heart Failure, Peripheral Artery Disease, Cardiac Arrhythmia, Pacemakers, and the ICD.   Other Education:  -Group or individual verbal, written, or video instructions that support the educational goals of the  cardiac rehab program.   Holiday Eating Survival Tips:  -Group instruction provided by PowerPoint slides, verbal discussion, and written materials to support subject matter. The instructor gives patients tips, tricks, and techniques to help them not only survive but enjoy the holidays despite the onslaught of food that accompanies the holidays.   Knowledge Questionnaire Score: Knowledge Questionnaire Score - 02/22/18 3016    Knowledge Questionnaire Score          Pre Score  23/24           Core Components/Risk Factors/Patient Goals at Admission: Personal Goals and Risk Factors at Admission - 02/22/18 0954    Core Components/Risk Factors/Patient Goals on Admission           Weight Management  Yes;Weight Maintenance    Intervention  Weight Management: Develop a combined nutrition and exercise program designed to reach desired caloric intake, while maintaining appropriate intake of nutrient and fiber, sodium and fats, and appropriate energy expenditure required for the weight goal.;Weight Management: Provide education and appropriate resources to help participant work on and attain dietary goals.;Weight Management/Obesity: Establish reasonable short term and long term weight goals.    Admit Weight  177 lb 11.1 oz (80.6 kg)    Expected Outcomes  Short Term: Continue to assess and modify interventions until short term weight is achieved;Long Term: Adherence to nutrition and physical activity/exercise program aimed toward attainment of established weight goal;Weight Maintenance: Understanding of the daily nutrition guidelines, which includes 25-35% calories from fat, 7% or less cal from saturated fats, less than 200mg  cholesterol, less than 1.5gm of sodium, & 5 or more servings of fruits and vegetables daily;Understanding recommendations for meals to include 15-35% energy as protein, 25-35% energy from fat, 35-60% energy from carbohydrates, less than 200mg  of dietary cholesterol, 20-35 gm of total  fiber daily;Understanding of distribution of calorie intake throughout the day with the consumption of 4-5 meals/snacks    Hypertension  Yes  Intervention  Provide education on lifestyle modifcations including regular physical activity/exercise, weight management, moderate sodium restriction and increased consumption of fresh fruit, vegetables, and low fat dairy, alcohol moderation, and smoking cessation.;Monitor prescription use compliance.    Expected Outcomes  Short Term: Continued assessment and intervention until BP is < 140/49mm HG in hypertensive participants. < 130/31mm HG in hypertensive participants with diabetes, heart failure or chronic kidney disease.;Long Term: Maintenance of blood pressure at goal levels.    Lipids  Yes    Intervention  Provide education and support for participant on nutrition & aerobic/resistive exercise along with prescribed medications to achieve LDL 70mg , HDL >40mg .    Expected Outcomes  Short Term: Participant states understanding of desired cholesterol values and is compliant with medications prescribed. Participant is following exercise prescription and nutrition guidelines.;Long Term: Cholesterol controlled with medications as prescribed, with individualized exercise RX and with personalized nutrition plan. Value goals: LDL < 70mg , HDL > 40 mg.    Stress  Yes    Intervention  Offer individual and/or small group education and counseling on adjustment to heart disease, stress management and health-related lifestyle change. Teach and support self-help strategies.;Refer participants experiencing significant psychosocial distress to appropriate mental health specialists for further evaluation and treatment. When possible, include family members and significant others in education/counseling sessions.    Expected Outcomes  Short Term: Participant demonstrates changes in health-related behavior, relaxation and other stress management skills, ability to obtain effective  social support, and compliance with psychotropic medications if prescribed.;Long Term: Emotional wellbeing is indicated by absence of clinically significant psychosocial distress or social isolation.           Core Components/Risk Factors/Patient Goals Review:  Goals and Risk Factor Review    Core Components/Risk Factors/Patient Goals Review    Row Name 02/26/18 0920   Personal Goals Review  Weight Management/Obesity;Hypertension;Lipids;Stress;Heart Failure   Review  pt with multiple CAD RF demonstrates eagerness to participate in CR program. pt eager to resume previous activity level and restore heart health.    Expected Outcomes  pt will participate in CR exercise, nutrition and lifestyle modification to reduce overall RF.           Core Components/Risk Factors/Patient Goals at Discharge (Final Review):  Goals and Risk Factor Review - 02/26/18 0920    Core Components/Risk Factors/Patient Goals Review          Personal Goals Review  Weight Management/Obesity;Hypertension;Lipids;Stress;Heart Failure    Review  pt with multiple CAD RF demonstrates eagerness to participate in CR program. pt eager to resume previous activity level and restore heart health.     Expected Outcomes  pt will participate in CR exercise, nutrition and lifestyle modification to reduce overall RF.            ITP Comments: ITP Comments    Row Name 02/22/18 5974 02/26/18 0915 03/07/18 1530   ITP Comments  Dr. Fransico Him, Medical Director  pt started group exercise today. pt tolerated light activity without difficulty. pt oriented to exercise equipment and safety routine. pt vervbalized understanding.   30 day ITP review.        Comments:

## 2018-03-09 ENCOUNTER — Encounter (HOSPITAL_COMMUNITY)
Admission: RE | Admit: 2018-03-09 | Discharge: 2018-03-09 | Disposition: A | Discharge status: Home / Self Care | Payer: BLUE CROSS/BLUE SHIELD | Source: Ambulatory Visit | Attending: Cardiology | Admitting: Cardiology

## 2018-03-09 ENCOUNTER — Ambulatory Visit (HOSPITAL_COMMUNITY): Payer: BLUE CROSS/BLUE SHIELD

## 2018-03-09 DIAGNOSIS — Z7982 Long term (current) use of aspirin: Secondary | ICD-10-CM | POA: Diagnosis not present

## 2018-03-09 DIAGNOSIS — I252 Old myocardial infarction: Secondary | ICD-10-CM | POA: Diagnosis not present

## 2018-03-09 DIAGNOSIS — E785 Hyperlipidemia, unspecified: Secondary | ICD-10-CM | POA: Diagnosis not present

## 2018-03-09 DIAGNOSIS — I2510 Atherosclerotic heart disease of native coronary artery without angina pectoris: Secondary | ICD-10-CM | POA: Diagnosis not present

## 2018-03-09 DIAGNOSIS — Z79890 Hormone replacement therapy (postmenopausal): Secondary | ICD-10-CM | POA: Diagnosis not present

## 2018-03-09 DIAGNOSIS — I2102 ST elevation (STEMI) myocardial infarction involving left anterior descending coronary artery: Secondary | ICD-10-CM | POA: Diagnosis not present

## 2018-03-09 DIAGNOSIS — Z955 Presence of coronary angioplasty implant and graft: Secondary | ICD-10-CM

## 2018-03-09 DIAGNOSIS — I10 Essential (primary) hypertension: Secondary | ICD-10-CM | POA: Diagnosis not present

## 2018-03-09 DIAGNOSIS — Z79899 Other long term (current) drug therapy: Secondary | ICD-10-CM | POA: Diagnosis not present

## 2018-03-09 DIAGNOSIS — Z9861 Coronary angioplasty status: Secondary | ICD-10-CM | POA: Diagnosis not present

## 2018-03-09 NOTE — Progress Notes (Signed)
Daily Session Note  Patient Details  Name: Michael Terrell MRN: [id number] Date of Birth: [birth year] Referring Provider:   Flowsheet Row CARDIAC REHAB PHASE II ORIENTATION from 02/22/2018 in [place]  Referring Provider  Dr. Virgina Jock      Encounter Date: 03/09/2018  Check In: Session Check In - 03/09/18 0840    Check-In          Supervising physician immediately available to respond to emergencies  [facility] immediately available    Physician(s)  Dr. Bonner Puna    Location  [facility]    Staff Present  Barnet Pall, RN, BSN;Josepha Barbier, RN, Mosie Epstein, MS,ACSM CEP, Exercise Physiologist;Dalton Kris Mouton, MS, Exercise Physiologist    Medication changes reported      No    Fall or balance concerns reported     No    Tobacco Cessation  No Change    Warm-up and Cool-down  Performed as group-led instruction    Resistance Training Performed  Yes    VAD Patient?  No    PAD/SET Patient?  No        Pain Assessment          Currently in Pain?  No/denies    Multiple Pain Sites  No           Capillary Blood Glucose: No results found for this or any previous visit (from the past 24 hour(s)).    Social History   Tobacco Use  Smoking Status Never Smoker  Smokeless Tobacco Never Used    Goals Met:  Exercise tolerated well  Goals Unmet:  Not Applicable  Comments: Pt has not heard from Dr. Ardelle Park office about scheduling MRI as indicated by Dr. Virgina Jock. PC to Dr. Virgina Jock office to inquire.  Spoke to Dr. Ardelle Park Office manager to request assistance with this matter for patient. She will take care of getting this arranged through [facility] for the patient.  Pt informed.  Understanding  Verbalized.   Pt would also like to change class time to 6:45am, the 8:15am class interferes with his work schedule. Pt will make this transition starting 01/11/2019.   Michael Hence, RN, BSN Cardiac Pulmonary Rehab    Dr.  Fransico Terrell is Medical Director for Cardiac Rehab at [facility].

## 2018-03-11 DIAGNOSIS — G4733 Obstructive sleep apnea (adult) (pediatric): Secondary | ICD-10-CM | POA: Diagnosis not present

## 2018-03-12 ENCOUNTER — Encounter (HOSPITAL_COMMUNITY): Payer: BLUE CROSS/BLUE SHIELD

## 2018-03-12 ENCOUNTER — Encounter (HOSPITAL_COMMUNITY)
Admission: RE | Admit: 2018-03-12 | Discharge: 2018-03-12 | Disposition: A | Discharge status: Home / Self Care | Payer: BLUE CROSS/BLUE SHIELD | Source: Ambulatory Visit | Attending: Cardiology | Admitting: Cardiology

## 2018-03-12 ENCOUNTER — Telehealth: Payer: Self-pay | Admitting: *Deleted

## 2018-03-12 ENCOUNTER — Ambulatory Visit (HOSPITAL_COMMUNITY): Payer: BLUE CROSS/BLUE SHIELD

## 2018-03-12 DIAGNOSIS — Z955 Presence of coronary angioplasty implant and graft: Secondary | ICD-10-CM

## 2018-03-12 DIAGNOSIS — I10 Essential (primary) hypertension: Secondary | ICD-10-CM | POA: Diagnosis not present

## 2018-03-12 DIAGNOSIS — Z79890 Hormone replacement therapy (postmenopausal): Secondary | ICD-10-CM | POA: Diagnosis not present

## 2018-03-12 DIAGNOSIS — Z7982 Long term (current) use of aspirin: Secondary | ICD-10-CM | POA: Diagnosis not present

## 2018-03-12 DIAGNOSIS — I2102 ST elevation (STEMI) myocardial infarction involving left anterior descending coronary artery: Secondary | ICD-10-CM | POA: Diagnosis not present

## 2018-03-12 DIAGNOSIS — Z79899 Other long term (current) drug therapy: Secondary | ICD-10-CM | POA: Diagnosis not present

## 2018-03-12 DIAGNOSIS — I2510 Atherosclerotic heart disease of native coronary artery without angina pectoris: Secondary | ICD-10-CM | POA: Diagnosis not present

## 2018-03-12 DIAGNOSIS — E785 Hyperlipidemia, unspecified: Secondary | ICD-10-CM | POA: Diagnosis not present

## 2018-03-12 NOTE — Telephone Encounter (Signed)
Left message regarding Cardiac MRI scheduled for Thursday 03/15/18 at 9:00am---arrive at 8:15 am 1st floor admitting office for check in.

## 2018-03-13 NOTE — Telephone Encounter (Signed)
Left message @ 8:20am

## 2018-03-14 ENCOUNTER — Ambulatory Visit (HOSPITAL_COMMUNITY): Payer: BLUE CROSS/BLUE SHIELD

## 2018-03-14 ENCOUNTER — Encounter (HOSPITAL_COMMUNITY): Payer: BLUE CROSS/BLUE SHIELD

## 2018-03-14 ENCOUNTER — Encounter (HOSPITAL_COMMUNITY)
Admission: RE | Admit: 2018-03-14 | Discharge: 2018-03-14 | Disposition: A | Discharge status: Home / Self Care | Payer: BLUE CROSS/BLUE SHIELD | Source: Ambulatory Visit | Attending: Cardiology | Admitting: Cardiology

## 2018-03-14 ENCOUNTER — Telehealth (HOSPITAL_COMMUNITY): Payer: Self-pay | Admitting: Emergency Medicine

## 2018-03-14 DIAGNOSIS — I10 Essential (primary) hypertension: Secondary | ICD-10-CM | POA: Diagnosis not present

## 2018-03-14 DIAGNOSIS — Z955 Presence of coronary angioplasty implant and graft: Secondary | ICD-10-CM | POA: Diagnosis not present

## 2018-03-14 DIAGNOSIS — Z79890 Hormone replacement therapy (postmenopausal): Secondary | ICD-10-CM | POA: Diagnosis not present

## 2018-03-14 DIAGNOSIS — Z7982 Long term (current) use of aspirin: Secondary | ICD-10-CM | POA: Diagnosis not present

## 2018-03-14 DIAGNOSIS — I2102 ST elevation (STEMI) myocardial infarction involving left anterior descending coronary artery: Secondary | ICD-10-CM | POA: Diagnosis not present

## 2018-03-14 DIAGNOSIS — Z79899 Other long term (current) drug therapy: Secondary | ICD-10-CM | POA: Diagnosis not present

## 2018-03-14 DIAGNOSIS — E785 Hyperlipidemia, unspecified: Secondary | ICD-10-CM | POA: Diagnosis not present

## 2018-03-14 DIAGNOSIS — I2510 Atherosclerotic heart disease of native coronary artery without angina pectoris: Secondary | ICD-10-CM | POA: Diagnosis not present

## 2018-03-14 NOTE — Telephone Encounter (Signed)
Left message 03/12/18 @ 12:56pm--03/13/18 @ 8:19am and 03/14/18 @ 10:32am---requested return confirmation call from patient

## 2018-03-14 NOTE — Telephone Encounter (Signed)
Left message on voicemail with name and callback number Antwine Agosto RN Navigator Cardiac Imaging [phone] 

## 2018-03-15 ENCOUNTER — Ambulatory Visit (HOSPITAL_COMMUNITY)
Admission: RE | Admit: 2018-03-15 | Discharge: 2018-03-15 | Disposition: A | Discharge status: Home / Self Care | Payer: BLUE CROSS/BLUE SHIELD | Source: Ambulatory Visit | Attending: Cardiology | Admitting: Cardiology

## 2018-03-15 DIAGNOSIS — I255 Ischemic cardiomyopathy: Secondary | ICD-10-CM | POA: Insufficient documentation

## 2018-03-15 LAB — CREATININE, SERUM
Creatinine, Ser: 1.17 mg/dL (ref 0.61–1.24)
GFR calc Af Amer: >60 mL/min (ref >60)
GFR calc non Af Amer: >60 mL/min (ref >60)

## 2018-03-15 MED ORDER — GADOBUTROL 1 MMOL/ML IV SOLN
10.0000 mL | Freq: Once | INTRAVENOUS | Status: AC | PRN
  Administered 2018-03-15: 8.5 mL via INTRAVENOUS

## 2018-03-16 ENCOUNTER — Ambulatory Visit (HOSPITAL_COMMUNITY): Payer: BLUE CROSS/BLUE SHIELD

## 2018-03-16 ENCOUNTER — Encounter (HOSPITAL_COMMUNITY)
Admission: RE | Admit: 2018-03-16 | Discharge: 2018-03-16 | Disposition: A | Discharge status: Home / Self Care | Payer: BLUE CROSS/BLUE SHIELD | Source: Ambulatory Visit | Attending: Cardiology | Admitting: Cardiology

## 2018-03-16 ENCOUNTER — Encounter (HOSPITAL_COMMUNITY): Payer: BLUE CROSS/BLUE SHIELD

## 2018-03-16 DIAGNOSIS — I2510 Atherosclerotic heart disease of native coronary artery without angina pectoris: Secondary | ICD-10-CM | POA: Diagnosis not present

## 2018-03-16 DIAGNOSIS — I10 Essential (primary) hypertension: Secondary | ICD-10-CM | POA: Diagnosis not present

## 2018-03-16 DIAGNOSIS — Z79890 Hormone replacement therapy (postmenopausal): Secondary | ICD-10-CM | POA: Diagnosis not present

## 2018-03-16 DIAGNOSIS — Z955 Presence of coronary angioplasty implant and graft: Secondary | ICD-10-CM | POA: Diagnosis not present

## 2018-03-16 DIAGNOSIS — Z79899 Other long term (current) drug therapy: Secondary | ICD-10-CM | POA: Diagnosis not present

## 2018-03-16 DIAGNOSIS — I2102 ST elevation (STEMI) myocardial infarction involving left anterior descending coronary artery: Secondary | ICD-10-CM

## 2018-03-16 DIAGNOSIS — Z7982 Long term (current) use of aspirin: Secondary | ICD-10-CM | POA: Diagnosis not present

## 2018-03-16 DIAGNOSIS — E785 Hyperlipidemia, unspecified: Secondary | ICD-10-CM | POA: Diagnosis not present

## 2018-03-19 ENCOUNTER — Encounter (HOSPITAL_COMMUNITY): Payer: BLUE CROSS/BLUE SHIELD

## 2018-03-19 ENCOUNTER — Ambulatory Visit (HOSPITAL_COMMUNITY): Payer: BLUE CROSS/BLUE SHIELD

## 2018-03-21 ENCOUNTER — Encounter (HOSPITAL_COMMUNITY)
Admission: RE | Admit: 2018-03-21 | Discharge: 2018-03-21 | Disposition: A | Discharge status: Home / Self Care | Payer: BLUE CROSS/BLUE SHIELD | Source: Ambulatory Visit | Attending: Cardiology | Admitting: Cardiology

## 2018-03-21 ENCOUNTER — Encounter (HOSPITAL_COMMUNITY): Payer: BLUE CROSS/BLUE SHIELD

## 2018-03-21 ENCOUNTER — Ambulatory Visit (HOSPITAL_COMMUNITY): Payer: BLUE CROSS/BLUE SHIELD

## 2018-03-21 DIAGNOSIS — Z7982 Long term (current) use of aspirin: Secondary | ICD-10-CM | POA: Diagnosis not present

## 2018-03-21 DIAGNOSIS — I2510 Atherosclerotic heart disease of native coronary artery without angina pectoris: Secondary | ICD-10-CM | POA: Diagnosis not present

## 2018-03-21 DIAGNOSIS — I10 Essential (primary) hypertension: Secondary | ICD-10-CM | POA: Diagnosis not present

## 2018-03-21 DIAGNOSIS — I2102 ST elevation (STEMI) myocardial infarction involving left anterior descending coronary artery: Secondary | ICD-10-CM

## 2018-03-21 DIAGNOSIS — Z955 Presence of coronary angioplasty implant and graft: Secondary | ICD-10-CM

## 2018-03-21 DIAGNOSIS — Z79890 Hormone replacement therapy (postmenopausal): Secondary | ICD-10-CM | POA: Diagnosis not present

## 2018-03-21 DIAGNOSIS — Z79899 Other long term (current) drug therapy: Secondary | ICD-10-CM | POA: Diagnosis not present

## 2018-03-21 DIAGNOSIS — E785 Hyperlipidemia, unspecified: Secondary | ICD-10-CM | POA: Diagnosis not present

## 2018-03-21 NOTE — Progress Notes (Signed)
I have reviewed a Home Exercise Prescription with Michael Terrell . Michael Terrell is currently exercising at home.  The patient was advised to continue to walk 5-7 days a week for 30-45 minutes.  Kyung Rudd and I discussed how to progress their exercise prescription. The patient stated that they understand the exercise prescription. We reviewed exercise guidelines, target heart rate during exercise, RPE Scale, weather conditions, NTG use, endpoints for exercise, warmup and cool down.  Patient is encouraged to come to me with any questions. I will continue to follow up with the patient to assist them with progression and safety.    Carma Lair MS, ACSM CEP 1:49 PM 03/21/2018

## 2018-03-22 DIAGNOSIS — I252 Old myocardial infarction: Secondary | ICD-10-CM | POA: Diagnosis not present

## 2018-03-22 DIAGNOSIS — I2510 Atherosclerotic heart disease of native coronary artery without angina pectoris: Secondary | ICD-10-CM | POA: Diagnosis not present

## 2018-03-22 DIAGNOSIS — R001 Bradycardia, unspecified: Secondary | ICD-10-CM | POA: Diagnosis not present

## 2018-03-22 DIAGNOSIS — I255 Ischemic cardiomyopathy: Secondary | ICD-10-CM | POA: Diagnosis not present

## 2018-03-22 DIAGNOSIS — E782 Mixed hyperlipidemia: Secondary | ICD-10-CM | POA: Diagnosis not present

## 2018-03-23 ENCOUNTER — Ambulatory Visit (HOSPITAL_COMMUNITY): Payer: BLUE CROSS/BLUE SHIELD

## 2018-03-23 ENCOUNTER — Encounter (HOSPITAL_COMMUNITY)
Admission: RE | Admit: 2018-03-23 | Discharge: 2018-03-23 | Disposition: A | Discharge status: Home / Self Care | Payer: BLUE CROSS/BLUE SHIELD | Source: Ambulatory Visit | Attending: Cardiology | Admitting: Cardiology

## 2018-03-23 ENCOUNTER — Encounter (HOSPITAL_COMMUNITY): Payer: BLUE CROSS/BLUE SHIELD

## 2018-03-23 DIAGNOSIS — Z955 Presence of coronary angioplasty implant and graft: Secondary | ICD-10-CM | POA: Diagnosis not present

## 2018-03-23 DIAGNOSIS — I2102 ST elevation (STEMI) myocardial infarction involving left anterior descending coronary artery: Secondary | ICD-10-CM

## 2018-03-23 DIAGNOSIS — Z79899 Other long term (current) drug therapy: Secondary | ICD-10-CM | POA: Diagnosis not present

## 2018-03-23 DIAGNOSIS — I2510 Atherosclerotic heart disease of native coronary artery without angina pectoris: Secondary | ICD-10-CM | POA: Diagnosis not present

## 2018-03-23 DIAGNOSIS — E785 Hyperlipidemia, unspecified: Secondary | ICD-10-CM | POA: Diagnosis not present

## 2018-03-23 DIAGNOSIS — I10 Essential (primary) hypertension: Secondary | ICD-10-CM | POA: Diagnosis not present

## 2018-03-23 DIAGNOSIS — Z79890 Hormone replacement therapy (postmenopausal): Secondary | ICD-10-CM | POA: Diagnosis not present

## 2018-03-23 DIAGNOSIS — Z7982 Long term (current) use of aspirin: Secondary | ICD-10-CM | POA: Diagnosis not present

## 2018-03-26 ENCOUNTER — Encounter (HOSPITAL_COMMUNITY): Payer: BLUE CROSS/BLUE SHIELD

## 2018-03-26 ENCOUNTER — Ambulatory Visit (HOSPITAL_COMMUNITY): Payer: BLUE CROSS/BLUE SHIELD

## 2018-03-26 ENCOUNTER — Encounter (HOSPITAL_COMMUNITY)
Admission: RE | Admit: 2018-03-26 | Discharge: 2018-03-26 | Disposition: A | Discharge status: Home / Self Care | Payer: BLUE CROSS/BLUE SHIELD | Source: Ambulatory Visit | Attending: Cardiology | Admitting: Cardiology

## 2018-03-26 DIAGNOSIS — I2510 Atherosclerotic heart disease of native coronary artery without angina pectoris: Secondary | ICD-10-CM | POA: Diagnosis not present

## 2018-03-26 DIAGNOSIS — R001 Bradycardia, unspecified: Secondary | ICD-10-CM | POA: Diagnosis not present

## 2018-03-26 DIAGNOSIS — Z79890 Hormone replacement therapy (postmenopausal): Secondary | ICD-10-CM | POA: Diagnosis not present

## 2018-03-26 DIAGNOSIS — Z79899 Other long term (current) drug therapy: Secondary | ICD-10-CM | POA: Diagnosis not present

## 2018-03-26 DIAGNOSIS — E785 Hyperlipidemia, unspecified: Secondary | ICD-10-CM | POA: Diagnosis not present

## 2018-03-26 DIAGNOSIS — I10 Essential (primary) hypertension: Secondary | ICD-10-CM | POA: Diagnosis not present

## 2018-03-26 DIAGNOSIS — I2102 ST elevation (STEMI) myocardial infarction involving left anterior descending coronary artery: Secondary | ICD-10-CM | POA: Diagnosis not present

## 2018-03-26 DIAGNOSIS — Z955 Presence of coronary angioplasty implant and graft: Secondary | ICD-10-CM

## 2018-03-26 DIAGNOSIS — Z7982 Long term (current) use of aspirin: Secondary | ICD-10-CM | POA: Diagnosis not present

## 2018-03-26 NOTE — Addendum Note (Signed)
Encounter addended by: Carma Lair on: 03/26/2018 5:03 PM  Actions taken: Flowsheet data copied forward, Flowsheet accepted

## 2018-03-28 ENCOUNTER — Encounter (HOSPITAL_COMMUNITY): Payer: BLUE CROSS/BLUE SHIELD

## 2018-03-28 ENCOUNTER — Encounter (HOSPITAL_COMMUNITY)
Admission: RE | Admit: 2018-03-28 | Discharge: 2018-03-28 | Disposition: A | Discharge status: Home / Self Care | Payer: BLUE CROSS/BLUE SHIELD | Source: Ambulatory Visit | Attending: Cardiology | Admitting: Cardiology

## 2018-03-28 ENCOUNTER — Ambulatory Visit (HOSPITAL_COMMUNITY): Payer: BLUE CROSS/BLUE SHIELD

## 2018-03-28 DIAGNOSIS — Z955 Presence of coronary angioplasty implant and graft: Secondary | ICD-10-CM | POA: Diagnosis not present

## 2018-03-28 DIAGNOSIS — Z79899 Other long term (current) drug therapy: Secondary | ICD-10-CM | POA: Diagnosis not present

## 2018-03-28 DIAGNOSIS — I10 Essential (primary) hypertension: Secondary | ICD-10-CM | POA: Diagnosis not present

## 2018-03-28 DIAGNOSIS — I2510 Atherosclerotic heart disease of native coronary artery without angina pectoris: Secondary | ICD-10-CM | POA: Diagnosis not present

## 2018-03-28 DIAGNOSIS — Z79890 Hormone replacement therapy (postmenopausal): Secondary | ICD-10-CM | POA: Diagnosis not present

## 2018-03-28 DIAGNOSIS — E785 Hyperlipidemia, unspecified: Secondary | ICD-10-CM | POA: Diagnosis not present

## 2018-03-28 DIAGNOSIS — I2102 ST elevation (STEMI) myocardial infarction involving left anterior descending coronary artery: Secondary | ICD-10-CM | POA: Diagnosis not present

## 2018-03-28 DIAGNOSIS — Z7982 Long term (current) use of aspirin: Secondary | ICD-10-CM | POA: Diagnosis not present

## 2018-03-30 ENCOUNTER — Encounter (HOSPITAL_COMMUNITY)
Admission: RE | Admit: 2018-03-30 | Discharge: 2018-03-30 | Disposition: A | Discharge status: Home / Self Care | Payer: BLUE CROSS/BLUE SHIELD | Source: Ambulatory Visit | Attending: Cardiology | Admitting: Cardiology

## 2018-03-30 ENCOUNTER — Ambulatory Visit (HOSPITAL_COMMUNITY): Payer: BLUE CROSS/BLUE SHIELD

## 2018-03-30 ENCOUNTER — Encounter (HOSPITAL_COMMUNITY): Payer: BLUE CROSS/BLUE SHIELD

## 2018-03-30 DIAGNOSIS — Z955 Presence of coronary angioplasty implant and graft: Secondary | ICD-10-CM | POA: Diagnosis not present

## 2018-03-30 DIAGNOSIS — Z79890 Hormone replacement therapy (postmenopausal): Secondary | ICD-10-CM | POA: Diagnosis not present

## 2018-03-30 DIAGNOSIS — Z79899 Other long term (current) drug therapy: Secondary | ICD-10-CM | POA: Diagnosis not present

## 2018-03-30 DIAGNOSIS — E785 Hyperlipidemia, unspecified: Secondary | ICD-10-CM | POA: Diagnosis not present

## 2018-03-30 DIAGNOSIS — Z7982 Long term (current) use of aspirin: Secondary | ICD-10-CM | POA: Diagnosis not present

## 2018-03-30 DIAGNOSIS — I2102 ST elevation (STEMI) myocardial infarction involving left anterior descending coronary artery: Secondary | ICD-10-CM | POA: Diagnosis not present

## 2018-03-30 DIAGNOSIS — I2510 Atherosclerotic heart disease of native coronary artery without angina pectoris: Secondary | ICD-10-CM | POA: Diagnosis not present

## 2018-03-30 DIAGNOSIS — I10 Essential (primary) hypertension: Secondary | ICD-10-CM | POA: Diagnosis not present

## 2018-04-02 ENCOUNTER — Encounter (HOSPITAL_COMMUNITY)
Admission: RE | Admit: 2018-04-02 | Discharge: 2018-04-02 | Disposition: A | Discharge status: Home / Self Care | Payer: BLUE CROSS/BLUE SHIELD | Source: Ambulatory Visit | Attending: Cardiology | Admitting: Cardiology

## 2018-04-02 ENCOUNTER — Ambulatory Visit (HOSPITAL_COMMUNITY): Payer: BLUE CROSS/BLUE SHIELD

## 2018-04-02 ENCOUNTER — Encounter (HOSPITAL_COMMUNITY): Payer: BLUE CROSS/BLUE SHIELD

## 2018-04-02 DIAGNOSIS — I2510 Atherosclerotic heart disease of native coronary artery without angina pectoris: Secondary | ICD-10-CM | POA: Insufficient documentation

## 2018-04-02 DIAGNOSIS — I10 Essential (primary) hypertension: Secondary | ICD-10-CM | POA: Diagnosis not present

## 2018-04-02 DIAGNOSIS — E785 Hyperlipidemia, unspecified: Secondary | ICD-10-CM | POA: Insufficient documentation

## 2018-04-02 DIAGNOSIS — Z7982 Long term (current) use of aspirin: Secondary | ICD-10-CM | POA: Diagnosis not present

## 2018-04-02 DIAGNOSIS — Z79890 Hormone replacement therapy (postmenopausal): Secondary | ICD-10-CM | POA: Diagnosis not present

## 2018-04-02 DIAGNOSIS — Z955 Presence of coronary angioplasty implant and graft: Secondary | ICD-10-CM

## 2018-04-02 DIAGNOSIS — I2102 ST elevation (STEMI) myocardial infarction involving left anterior descending coronary artery: Secondary | ICD-10-CM | POA: Insufficient documentation

## 2018-04-02 DIAGNOSIS — Z79899 Other long term (current) drug therapy: Secondary | ICD-10-CM | POA: Insufficient documentation

## 2018-04-04 ENCOUNTER — Ambulatory Visit (HOSPITAL_COMMUNITY): Payer: BLUE CROSS/BLUE SHIELD

## 2018-04-04 ENCOUNTER — Encounter (HOSPITAL_COMMUNITY): Payer: BLUE CROSS/BLUE SHIELD

## 2018-04-04 ENCOUNTER — Encounter (HOSPITAL_COMMUNITY)
Admission: RE | Admit: 2018-04-04 | Discharge: 2018-04-04 | Disposition: A | Discharge status: Home / Self Care | Payer: BLUE CROSS/BLUE SHIELD | Source: Ambulatory Visit | Attending: Cardiology | Admitting: Cardiology

## 2018-04-04 DIAGNOSIS — I10 Essential (primary) hypertension: Secondary | ICD-10-CM | POA: Diagnosis not present

## 2018-04-04 DIAGNOSIS — Z955 Presence of coronary angioplasty implant and graft: Secondary | ICD-10-CM

## 2018-04-04 DIAGNOSIS — Z79890 Hormone replacement therapy (postmenopausal): Secondary | ICD-10-CM | POA: Diagnosis not present

## 2018-04-04 DIAGNOSIS — I2510 Atherosclerotic heart disease of native coronary artery without angina pectoris: Secondary | ICD-10-CM | POA: Diagnosis not present

## 2018-04-04 DIAGNOSIS — Z7982 Long term (current) use of aspirin: Secondary | ICD-10-CM | POA: Diagnosis not present

## 2018-04-04 DIAGNOSIS — Z79899 Other long term (current) drug therapy: Secondary | ICD-10-CM | POA: Diagnosis not present

## 2018-04-04 DIAGNOSIS — I2102 ST elevation (STEMI) myocardial infarction involving left anterior descending coronary artery: Secondary | ICD-10-CM | POA: Diagnosis not present

## 2018-04-04 DIAGNOSIS — E785 Hyperlipidemia, unspecified: Secondary | ICD-10-CM | POA: Diagnosis not present

## 2018-04-05 NOTE — Progress Notes (Signed)
Cardiac Individual Treatment Plan  Patient Details  Name: Michael Terrell MRN: [id number] Date of Birth: 1955-03-12 Referring Provider:     CARDIAC REHAB PHASE II ORIENTATION from 02/22/2018 in [place]  Referring Provider  Dr. Virgina Jock      Initial Encounter Date:    CARDIAC REHAB PHASE II ORIENTATION from 02/22/2018 in [place]  Date  02/22/18      Visit Diagnosis: STEMI involving left anterior descending coronary artery ([place]) 12/04/17  Status post coronary artery stent placement, S/P DES LAD 12/04/17  Patient's Home Medications on Admission:  Current Outpatient Medications:  .  aspirin 81 MG tablet, Take 81 mg by mouth daily. , Disp: , Rfl:  .  cetirizine (ZYRTEC) 10 MG tablet, Take 10 mg by mouth daily as needed for allergies. , Disp: , Rfl:  .  Cholecalciferol (VITAMIN D-3 PO), Take 2,500 Units by mouth daily., Disp: , Rfl:  .  Evolocumab (REPATHA SURECLICK) 270 MG/ML SOAJ, Inject 140 mg into the skin every 14 (fourteen) days., Disp: , Rfl:  .  isosorbide-hydrALAZINE (BIDIL) 20-37.5 MG tablet, Take 1 tablet by mouth 2 (two) times daily., Disp: , Rfl:  .  levothyroxine (SYNTHROID, LEVOTHROID) 75 MCG tablet, Take 75 mcg by mouth daily before breakfast., Disp: , Rfl:  .  losartan (COZAAR) 50 MG tablet, Take 1 tablet (50 mg total) by mouth daily., Disp: 30 tablet, Rfl: 1 .  montelukast (SINGULAIR) 10 MG tablet, Take 10 mg by mouth at bedtime as needed. , Disp: , Rfl:  .  nitroGLYCERIN (NITROSTAT) 0.4 MG SL tablet, Place 1 tablet (0.4 mg total) under the tongue every 5 (five) minutes as needed for chest pain., Disp: 25 tablet, Rfl: 3 .  pantoprazole (PROTONIX) 20 MG tablet, Take 1 tablet (20 mg total) by mouth daily., Disp: 30 tablet, Rfl: 6 .  Polyvinyl Alcohol-Povidone (REFRESH OP), Place 1 drop into both eyes 3 (three) times daily as needed (for dry eyes). , Disp: , Rfl:  .  ticagrelor (BRILINTA) 90 MG TABS  tablet, Take 1 tablet (90 mg total) by mouth 2 (two) times daily., Disp: 60 tablet, Rfl: 3  Past Medical History: Past Medical History:  Diagnosis Date  . Acute anterior wall MI ([place]) 10/2009  . CAD (coronary artery disease)    BMS to LAD (2011)  . Chest pain   . Dyslipidemia   . Hyperlipidemia   . Hypertension   . Sinus pause   . Snoring     Tobacco Use: Social History   Tobacco Use  Smoking Status Never Smoker  Smokeless Tobacco Never Used    Labs: Recent Review Flowsheet Data    Labs for ITP Cardiac and Pulmonary Rehab Latest Ref Rng & Units 07/09/2013 09/24/2013 02/07/2014 12/04/2017 12/05/2017   Cholestrol 0 - 200 mg/dL 146 130 133 183 -   LDLCALC 0 - 99 mg/dL 78 70 71 109(H) -   HDL >40 mg/dL 29(L) 39(L) 38(L) 31(L) -   Trlycerides <150 mg/dL 196(H) 104 119 215(H) -   Hemoglobin A1c <5.7 % - - - - -   TCO2 22 - 32 mmol/L - - - - 22      Capillary Blood Glucose: No results found for: GLUCAP   Exercise Target Goals: Exercise Program Goal: Individual exercise prescription set using results from initial 6 min walk test and THRR while considering  patient's activity barriers and safety.   Exercise Prescription Goal: Initial exercise prescription builds to  30-45 minutes a day of aerobic activity, 2-3 days per week.  Home exercise guidelines will be given to patient during program as part of exercise prescription that the participant will acknowledge.  Activity Barriers & Risk Stratification: Activity Barriers & Cardiac Risk Stratification - 02/22/18 0951      Activity Barriers & Cardiac Risk Stratification   Activity Barriers  Other (comment)    Comments  Life Vest     Cardiac Risk Stratification  High       6 Minute Walk: 6 Minute Walk    Row Name 02/22/18 0950         6 Minute Walk   Phase  Initial     Distance  1952 feet     Walk Time  6 minutes     # of Rest Breaks  0     MPH  3.7     METS  4.8     RPE  12     VO2 Peak  16.85     Symptoms  No      Resting HR  89 bpm     Resting BP  118/72     Resting Oxygen Saturation   100 %     Exercise Oxygen Saturation  during 6 min walk  99 %     Max Ex. HR  124 bpm     Max Ex. BP  140/80     2 Minute Post BP  124/72        Oxygen Initial Assessment:   Oxygen Re-Evaluation:   Oxygen Discharge (Final Oxygen Re-Evaluation):   Initial Exercise Prescription: Initial Exercise Prescription - 02/22/18 1000      Date of Initial Exercise RX and Referring Provider   Date  02/22/18    Referring Provider  Dr. Virgina Jock    Expected Discharge Date  06/01/18      Treadmill   MPH  3    Grade  2    Minutes  10      Recumbant Bike   Level  1.5    Watts  70    Minutes  10    METs  4.75      NuStep   Level  4    SPM  85    Minutes  10    METs  4.6      Prescription Details   Frequency (times per week)  3    Duration  Progress to 30 minutes of continuous aerobic without signs/symptoms of physical distress      Intensity   THRR 40-80% of Max Heartrate  63-126    Ratings of Perceived Exertion  11-13      Progression   Progression  Continue to progress workloads to maintain intensity without signs/symptoms of physical distress.      Resistance Training   Training Prescription  Yes    Weight  4 lbs.    Reps  10-15       Perform Capillary Blood Glucose checks as needed.  Exercise Prescription Changes: Exercise Prescription Changes    Row Name 02/26/18 1200 03/07/18 1702 03/16/18 1659 03/30/18 1000       Response to Exercise   Blood Pressure (Admit)  116/78  106/62  126/74  112/68    Blood Pressure (Exercise)  142/68  150/80  134/60  124/60    Blood Pressure (Exit)  108/72  124/84  110/62  110/70    Heart Rate (Admit)  92 bpm  92 bpm  84 bpm  88 bpm    Heart Rate (Exercise)  126 bpm  137 bpm  142 bpm  134 bpm    Heart Rate (Exit)  92 bpm  89 bpm  91 bpm  89 bpm    Rating of Perceived Exertion (Exercise)  13  13  13  14     Perceived Dyspnea (Exercise)  -  -  -  0     Symptoms  -  None  None  None     Comments  Pt first day of exercise  None  -  None    Duration  Progress to 30 minutes of  aerobic without signs/symptoms of physical distress  Progress to 30 minutes of  aerobic without signs/symptoms of physical distress  Progress to 30 minutes of  aerobic without signs/symptoms of physical distress  Progress to 45 minutes of aerobic exercise without signs/symptoms of physical distress    Intensity  THRR unchanged  THRR unchanged  THRR unchanged  THRR unchanged      Progression   Progression  Continue to progress workloads to maintain intensity without signs/symptoms of physical distress.  Continue to progress workloads to maintain intensity without signs/symptoms of physical distress.  Continue to progress workloads to maintain intensity without signs/symptoms of physical distress.  Continue to progress workloads to maintain intensity without signs/symptoms of physical distress.    Average METs  3.06  4.97  3.06  5.89      Resistance Training   Training Prescription  Yes  Yes  Yes  Yes    Weight  4 lbs.  4 lbs.  4 lbs.  5lbs    Reps  10-15  10-15  10-15  10-15    Time  10 Minutes  10 Minutes  10 Minutes  10 Minutes      Treadmill   MPH  3  3  3   3.3    Grade  2  2  2  3     Minutes  10  10  10  10     METs  -  4.12  4.12  4.89      Recumbant Bike   Level  1.5  3  3  3     Watts  70  70  85  95    Minutes  10  10  10  10     METs  4.75  6  6.2  5.9      NuStep   Level  4  4  4  5     SPM  85  85  95  95    Minutes  10  10  10  10     METs  4.6  4.8  4.9  6.5      Home Exercise Plan   Plans to continue exercise at  -  -  -  Home (comment) Walking    Frequency  -  -  -  Add 2 additional days to program exercise sessions.    Initial Home Exercises Provided  -  -  -  03/21/18       Exercise Comments: Exercise Comments    Row Name 02/26/18 1238 03/21/18 1350         Exercise Comments  Pt first day of exercise. Tolerated well.   Reveiwed HEP with  pt. Pt is currently exercising most days of the week. Will continue to monitor and progress pt as tolerated.         Exercise Goals  and Review: Exercise Goals    Row Name 02/22/18 [phone]             Exercise Goals   Increase Physical Activity  Yes       Intervention  Provide advice, education, support and counseling about physical activity/exercise needs.;Develop an individualized exercise prescription for aerobic and resistive training based on initial evaluation findings, risk stratification, comorbidities and participant's personal goals.       Expected Outcomes  Short Term: Attend rehab on a regular basis to increase amount of physical activity.       Increase Strength and Stamina  Yes       Intervention  Provide advice, education, support and counseling about physical activity/exercise needs.;Develop an individualized exercise prescription for aerobic and resistive training based on initial evaluation findings, risk stratification, comorbidities and participant's personal goals.       Expected Outcomes  Short Term: Increase workloads from initial exercise prescription for resistance, speed, and METs.       Able to understand and use rate of perceived exertion (RPE) scale  Yes       Intervention  Provide education and explanation on how to use RPE scale       Expected Outcomes  Short Term: Able to use RPE daily in rehab to express subjective intensity level;Long Term:  Able to use RPE to guide intensity level when exercising independently       Knowledge and understanding of Target Heart Rate Range (THRR)  Yes       Intervention  Provide education and explanation of THRR including how the numbers were predicted and where they are located for reference       Expected Outcomes  Short Term: Able to state/look up THRR;Long Term: Able to use THRR to govern intensity when exercising independently;Short Term: Able to use daily as guideline for intensity in rehab       Able to check pulse  independently  Yes       Intervention  Provide education and demonstration on how to check pulse in carotid and radial arteries.;Review the importance of being able to check your own pulse for safety during independent exercise       Expected Outcomes  Short Term: Able to explain why pulse checking is important during independent exercise;Long Term: Able to check pulse independently and accurately       Understanding of Exercise Prescription  Yes       Intervention  Provide education, explanation, and written materials on patient's individual exercise prescription       Expected Outcomes  Short Term: Able to explain program exercise prescription;Long Term: Able to explain home exercise prescription to exercise independently          Exercise Goals Re-Evaluation : Exercise Goals Re-Evaluation    [facility] Name 02/26/18 1237 03/21/18 1351           Exercise Goal Re-Evaluation   Exercise Goals Review  Increase Physical Activity;Increase Strength and Stamina;Able to check pulse independently;Knowledge and understanding of Target Heart Rate Range (THRR);Able to understand and use rate of perceived exertion (RPE) scale;Understanding of Exercise Prescription  Increase Physical Activity;Able to understand and use rate of perceived exertion (RPE) scale;Knowledge and understanding of Target Heart Rate Range (THRR);Understanding of Exercise Prescription;Increase Strength and Stamina;Able to check pulse independently      Comments  Pt first day of exercise. Tolerated exercise well. Will progress as tolerated.   Reviewed HEP with pt. Also reviewed THRR, RPE Scale, weather conditions,  endpoints of exercise, NTG use, when to call 911, warmup and cool down.       Expected Outcomes  Will monitor and progress Pt as tolerated.   Pt will continue to walk for exercise 6-7 days a week for 30-45 minutes. Pt will continue to improve cardiovascular strength. Will continue to monitor.         Discharge Exercise Prescription  (Final Exercise Prescription Changes): Exercise Prescription Changes - 03/30/18 1000      Response to Exercise   Blood Pressure (Admit)  112/68    Blood Pressure (Exercise)  124/60    Blood Pressure (Exit)  110/70    Heart Rate (Admit)  88 bpm    Heart Rate (Exercise)  134 bpm    Heart Rate (Exit)  89 bpm    Rating of Perceived Exertion (Exercise)  14    Perceived Dyspnea (Exercise)  0    Symptoms  None     Comments  None    Duration  Progress to 45 minutes of aerobic exercise without signs/symptoms of physical distress    Intensity  THRR unchanged      Progression   Progression  Continue to progress workloads to maintain intensity without signs/symptoms of physical distress.    Average METs  5.89      Resistance Training   Training Prescription  Yes    Weight  5lbs    Reps  10-15    Time  10 Minutes      Treadmill   MPH  3.3    Grade  3    Minutes  10    METs  4.89      Recumbant Bike   Level  3    Watts  95    Minutes  10    METs  5.9      NuStep   Level  5    SPM  95    Minutes  10    METs  6.5      Home Exercise Plan   Plans to continue exercise at  Home (comment)   Walking   Frequency  Add 2 additional days to program exercise sessions.    Initial Home Exercises Provided  03/21/18       Nutrition:  Target Goals: Understanding of nutrition guidelines, daily intake of sodium 1500mg , cholesterol 200mg , calories 30% from fat and 7% or less from saturated fats, daily to have 5 or more servings of fruits and vegetables.  Biometrics: Pre Biometrics - 02/22/18 0952      Pre Biometrics   Height  5' 8.5" (1.74 m)    Weight  80.6 kg    Waist Circumference  39 inches    Hip Circumference  39.5 inches    Waist to Hip Ratio  0.99 %    BMI (Calculated)  26.62    Triceps Skinfold  22 mm    % Body Fat  28.1 %    Grip Strength  40 kg    Flexibility  7 in    Single Leg Stand  30 seconds        Nutrition Therapy Plan and Nutrition Goals: Nutrition  Therapy & Goals - 02/22/18 0904      Nutrition Therapy   Diet  heart healthy      Personal Nutrition Goals   Nutrition Goal  Pt to identify and limit food sources of saturated fat, trans fat, refined carbohydrates and sodium    Personal Goal #2  Pt to  identify food quantities necessary to achieve weight loss of 6-24 lbs. at graduation from cardiac rehab.    Personal Goal #3  Pt to eat more and a variety of non-starchy vegetables    Personal Goal #4  Pt to go easy on high fat cheeses.      Intervention Plan   Intervention  Prescribe, educate and counsel regarding individualized specific dietary modifications aiming towards targeted core components such as weight, hypertension, lipid management, diabetes, heart failure and other comorbidities.    Expected Outcomes  Short Term Goal: Understand basic principles of dietary content, such as calories, fat, sodium, cholesterol and nutrients.;Long Term Goal: Adherence to prescribed nutrition plan.       Nutrition Assessments: Nutrition Assessments - 02/22/18 0905      MEDFICTS Scores   Pre Score  18       Nutrition Goals Re-Evaluation:   Nutrition Goals Re-Evaluation:   Nutrition Goals Discharge (Final Nutrition Goals Re-Evaluation):   Psychosocial: Target Goals: Acknowledge presence or absence of significant depression and/or stress, maximize coping skills, provide positive support system. Participant is able to verbalize types and ability to use techniques and skills needed for reducing stress and depression.  Initial Review & Psychosocial Screening: Initial Psych Review & Screening - 02/22/18 0927      Initial Review   Current issues with  Current Stress Concerns;Current Anxiety/Panic;Current Depression    Source of Stress Concerns  Chronic Illness   Michael Terrell has current concerns related to his treatment plan and the continued need for a Life Vest.      [place]?  Yes   Pt lists his wife, daughter,  son, and brother as sources of support.      Barriers   Psychosocial barriers to participate in program  The patient should benefit from training in stress management and relaxation.      Screening Interventions   Interventions  Encouraged to exercise;To provide support and resources with identified psychosocial needs   Pt states that he is able to manage his stress, depression, and anxiety on his own.       Quality of Life Scores: Quality of Life - 02/22/18 0826      Quality of Life   Select  Quality of Life      Quality of Life Scores   Health/Function Pre  19.6 %    Socioeconomic Pre  23.75 %    Psych/Spiritual Pre  21.21 %    Family Pre  27.6 %    GLOBAL Pre  21.91 %      Scores of 19 and below usually indicate a poorer quality of life in these areas.  A difference of  2-3 points is a clinically meaningful difference.  A difference of 2-3 points in the total score of the Quality of Life Index has been associated with significant improvement in overall quality of life, self-image, physical symptoms, and general health in studies assessing change in quality of life.  PHQ-9: Recent Review Flowsheet Data    Depression screen [facility] 2/9 02/26/2018   Decreased Interest 1   Down, Depressed, Hopeless 2   PHQ - 2 Score 3   Altered sleeping 2   Tired, decreased energy 2   Change in appetite 0   Feeling bad or failure about yourself  1   Trouble concentrating 0   Moving slowly or fidgety/restless 1   Suicidal thoughts 0   PHQ-9 Score 9   Difficult doing work/chores Somewhat difficult  Interpretation of Total Score  Total Score Depression Severity:  1-4 = Minimal depression, 5-9 = Mild depression, 10-14 = Moderate depression, 15-19 = Moderately severe depression, 20-27 = Severe depression   Psychosocial Evaluation and Intervention: Psychosocial Evaluation - 02/26/18 0917      Psychosocial Evaluation & Interventions   Interventions  Stress management education;Encouraged to  exercise with the program and follow exercise prescription;Relaxation education    Comments  pt with health related anxiety demonstrates associated depression symptoms. appt scheduled with Jeanella Craze 03/07/2018 @ 9:30.  pt enjoys coaching elementary cross country, reading and listening to music.     Expected Outcomes  pt will exhibit improved depression symptoms, good coping skills and positive outlook.     Continue Psychosocial Services   Follow up required by staff       Psychosocial Re-Evaluation: Psychosocial Re-Evaluation    [place] Name 04/04/18 1659             Psychosocial Re-Evaluation   Current issues with  Current Depression       Comments  Michael Terrell is no longer required to wear his LifeVest.  He is pleased by this.       Expected Outcomes  Michael Terrell will continue to exhibit a positive outlook with good coping skills.       Interventions  Stress management education;Relaxation education;Encouraged to attend Cardiac Rehabilitation for the exercise       Continue Psychosocial Services   Follow up required by staff          Psychosocial Discharge (Final Psychosocial Re-Evaluation): Psychosocial Re-Evaluation - 04/04/18 1659      Psychosocial Re-Evaluation   Current issues with  Current Depression    Comments  Michael Terrell is no longer required to wear his LifeVest.  He is pleased by this.    Expected Outcomes  Michael Terrell will continue to exhibit a positive outlook with good coping skills.    Interventions  Stress management education;Relaxation education;Encouraged to attend Cardiac Rehabilitation for the exercise    Continue Psychosocial Services   Follow up required by staff       Vocational Rehabilitation: Provide vocational rehab assistance to qualifying candidates.   Vocational Rehab Evaluation & Intervention: Vocational Rehab - 02/22/18 0921      Initial Vocational Rehab Evaluation & Intervention   Assessment shows need for Vocational Rehabilitation  No       Education: Education  Goals: Education classes will be provided on a weekly basis, covering required topics. Participant will state understanding/return demonstration of topics presented.  Learning Barriers/Preferences: Learning Barriers/Preferences - 02/22/18 0953      Learning Barriers/Preferences   Learning Barriers  Sight    Learning Preferences  Pictoral;Video;Written Material       Education Topics: Count Your Pulse:  -Group instruction provided by verbal instruction, demonstration, patient participation and written materials to support subject.  Instructors address importance of being able to find your pulse and how to count your pulse when at home without a heart monitor.  Patients get hands on experience counting their pulse with staff help and individually.   CARDIAC REHAB PHASE II EXERCISE from 03/23/2018 in [place]  Date  03/23/18  Instruction Review Code  2- Demonstrated Understanding      Heart Attack, Angina, and Risk Factor Modification:  -Group instruction provided by verbal instruction, video, and written materials to support subject.  Instructors address signs and symptoms of angina and heart attacks.    Also discuss risk factors for  heart disease and how to make changes to improve heart health risk factors.   Functional Fitness:  -Group instruction provided by verbal instruction, demonstration, patient participation, and written materials to support subject.  Instructors address safety measures for doing things around the house.  Discuss how to get up and down off the floor, how to pick things up properly, how to safely get out of a chair without assistance, and balance training.   CARDIAC REHAB PHASE II EXERCISE from 03/23/2018 in [place]  Date  03/16/18  Instruction Review Code  2- Demonstrated Understanding      Meditation and Mindfulness:  -Group instruction provided by verbal instruction, patient participation,  and written materials to support subject.  Instructor addresses importance of mindfulness and meditation practice to help reduce stress and improve awareness.  Instructor also leads participants through a meditation exercise.    CARDIAC REHAB PHASE II EXERCISE from 03/23/2018 in [place]  Date  03/14/18  Educator  [place]  Instruction Review Code  2- Demonstrated Understanding      Stretching for Flexibility and Mobility:  -Group instruction provided by verbal instruction, patient participation, and written materials to support subject.  Instructors lead participants through series of stretches that are designed to increase flexibility thus improving mobility.  These stretches are additional exercise for major muscle groups that are typically performed during regular warm up and cool down.   Hands Only CPR:  -Group verbal, video, and participation provides a basic overview of AHA guidelines for community CPR. Role-play of emergencies allow participants the opportunity to practice calling for help and chest compression technique with discussion of AED use.   Hypertension: -Group verbal and written instruction that provides a basic overview of hypertension including the most recent diagnostic guidelines, risk factor reduction with self-care instructions and medication management.   CARDIAC REHAB PHASE II EXERCISE from 03/23/2018 in [place]  Date  03/02/18  Educator  RN  Instruction Review Code  2- Demonstrated Understanding       Nutrition I class: Heart Healthy Eating:  -Group instruction provided by PowerPoint slides, verbal discussion, and written materials to support subject matter. The instructor gives an explanation and review of the Therapeutic Lifestyle Changes diet recommendations, which includes a discussion on lipid goals, dietary fat, sodium, fiber, plant stanol/sterol esters, sugar, and the components of a  well-balanced, healthy diet.   Nutrition II class: Lifestyle Skills:  -Group instruction provided by PowerPoint slides, verbal discussion, and written materials to support subject matter. The instructor gives an explanation and review of label reading, grocery shopping for heart health, heart healthy recipe modifications, and ways to make healthier choices when eating out.   Diabetes Question & Answer:  -Group instruction provided by PowerPoint slides, verbal discussion, and written materials to support subject matter. The instructor gives an explanation and review of diabetes co-morbidities, pre- and post-prandial blood glucose goals, pre-exercise blood glucose goals, signs, symptoms, and treatment of hypoglycemia and hyperglycemia, and foot care basics.   Diabetes Blitz:  -Group instruction provided by PowerPoint slides, verbal discussion, and written materials to support subject matter. The instructor gives an explanation and review of the physiology behind type 1 and type 2 diabetes, diabetes medications and rational behind using different medications, pre- and post-prandial blood glucose recommendations and Hemoglobin A1c goals, diabetes diet, and exercise including blood glucose guidelines for exercising safely.    Portion Distortion:  -Group instruction provided by PowerPoint slides,  verbal discussion, written materials, and food models to support subject matter. The instructor gives an explanation of serving size versus portion size, changes in portions sizes over the last 20 years, and what consists of a serving from each food group.   Stress Management:  -Group instruction provided by verbal instruction, video, and written materials to support subject matter.  Instructors review role of stress in heart disease and how to cope with stress positively.     CARDIAC REHAB PHASE II EXERCISE from 03/30/2018 in [place]  Date  03/30/18  Instruction Review  Code  2- Demonstrated Understanding      Exercising on Your Own:  -Group instruction provided by verbal instruction, power point, and written materials to support subject.  Instructors discuss benefits of exercise, components of exercise, frequency and intensity of exercise, and end points for exercise.  Also discuss use of nitroglycerin and activating EMS.  Review options of places to exercise outside of rehab.  Review guidelines for sex with heart disease.   Cardiac Drugs I:  -Group instruction provided by verbal instruction and written materials to support subject.  Instructor reviews cardiac drug classes: antiplatelets, anticoagulants, beta blockers, and statins.  Instructor discusses reasons, side effects, and lifestyle considerations for each drug class.   CARDIAC REHAB PHASE II EXERCISE from 03/23/2018 in [place]  Date  03/07/18  Instruction Review Code  2- Demonstrated Understanding      Cardiac Drugs II:  -Group instruction provided by verbal instruction and written materials to support subject.  Instructor reviews cardiac drug classes: angiotensin converting enzyme inhibitors (ACE-I), angiotensin II receptor blockers (ARBs), nitrates, and calcium channel blockers.  Instructor discusses reasons, side effects, and lifestyle considerations for each drug class.   Anatomy and Physiology of the Circulatory System:  Group verbal and written instruction and models provide basic cardiac anatomy and physiology, with the coronary electrical and arterial systems. Review of: AMI, Angina, Valve disease, Heart Failure, Peripheral Artery Disease, Cardiac Arrhythmia, Pacemakers, and the ICD.   Other Education:  -Group or individual verbal, written, or video instructions that support the educational goals of the cardiac rehab program.   Holiday Eating Survival Tips:  -Group instruction provided by PowerPoint slides, verbal discussion, and written materials to  support subject matter. The instructor gives patients tips, tricks, and techniques to help them not only survive but enjoy the holidays despite the onslaught of food that accompanies the holidays.   Knowledge Questionnaire Score: Knowledge Questionnaire Score - 02/22/18 6578      Knowledge Questionnaire Score   Pre Score  23/24       Core Components/Risk Factors/Patient Goals at Admission: Personal Goals and Risk Factors at Admission - 02/22/18 0954      Core Components/Risk Factors/Patient Goals on Admission    Weight Management  Yes;Weight Maintenance    Intervention  Weight Management: Develop a combined nutrition and exercise program designed to reach desired caloric intake, while maintaining appropriate intake of nutrient and fiber, sodium and fats, and appropriate energy expenditure required for the weight goal.;Weight Management: Provide education and appropriate resources to help participant work on and attain dietary goals.;Weight Management/Obesity: Establish reasonable short term and long term weight goals.    Admit Weight  177 lb 11.1 oz (80.6 kg)    Expected Outcomes  Short Term: Continue to assess and modify interventions until short term weight is achieved;Long Term: Adherence to nutrition and physical activity/exercise program aimed toward attainment of established weight  goal;Weight Maintenance: Understanding of the daily nutrition guidelines, which includes 25-35% calories from fat, 7% or less cal from saturated fats, less than 200mg  cholesterol, less than 1.5gm of sodium, & 5 or more servings of fruits and vegetables daily;Understanding recommendations for meals to include 15-35% energy as protein, 25-35% energy from fat, 35-60% energy from carbohydrates, less than 200mg  of dietary cholesterol, 20-35 gm of total fiber daily;Understanding of distribution of calorie intake throughout the day with the consumption of 4-5 meals/snacks    Hypertension  Yes    Intervention  Provide  education on lifestyle modifcations including regular physical activity/exercise, weight management, moderate sodium restriction and increased consumption of fresh fruit, vegetables, and low fat dairy, alcohol moderation, and smoking cessation.;Monitor prescription use compliance.    Expected Outcomes  Short Term: Continued assessment and intervention until BP is < 140/79mm HG in hypertensive participants. < 130/72mm HG in hypertensive participants with diabetes, heart failure or chronic kidney disease.;Long Term: Maintenance of blood pressure at goal levels.    Lipids  Yes    Intervention  Provide education and support for participant on nutrition & aerobic/resistive exercise along with prescribed medications to achieve LDL 70mg , HDL >40mg .    Expected Outcomes  Short Term: Participant states understanding of desired cholesterol values and is compliant with medications prescribed. Participant is following exercise prescription and nutrition guidelines.;Long Term: Cholesterol controlled with medications as prescribed, with individualized exercise RX and with personalized nutrition plan. Value goals: LDL < 70mg , HDL > 40 mg.    Stress  Yes    Intervention  Offer individual and/or small group education and counseling on adjustment to heart disease, stress management and health-related lifestyle change. Teach and support self-help strategies.;Refer participants experiencing significant psychosocial distress to appropriate mental health specialists for further evaluation and treatment. When possible, include family members and significant others in education/counseling sessions.    Expected Outcomes  Short Term: Participant demonstrates changes in health-related behavior, relaxation and other stress management skills, ability to obtain effective social support, and compliance with psychotropic medications if prescribed.;Long Term: Emotional wellbeing is indicated by absence of clinically significant psychosocial  distress or social isolation.       Core Components/Risk Factors/Patient Goals Review:  Goals and Risk Factor Review    Row Name 02/26/18 0920 04/04/18 1700           Core Components/Risk Factors/Patient Goals Review   Personal Goals Review  Weight Management/Obesity;Hypertension;Lipids;Stress;Heart Failure  Weight Management/Obesity;Hypertension;Lipids;Stress;Heart Failure      Review  pt with multiple CAD RF demonstrates eagerness to participate in CR program. pt eager to resume previous activity level and restore heart health.   Pt with multiple CAD RFs willing to participate in CR exercise. Michael Terrell is tolerating increased workloads well.  He has switched class times and has great attendance.  He is very pleased that his EF has increased and he does not have to wear the LifeVest.       Expected Outcomes  pt will participate in CR exercise, nutrition and lifestyle modification to reduce overall RF.   Pt will continue to participate in CR exercise, nutrition, and lifestyle modification opportunities to reduce overall RF.          Core Components/Risk Factors/Patient Goals at Discharge (Final Review):  Goals and Risk Factor Review - 04/04/18 1700      Core Components/Risk Factors/Patient Goals Review   Personal Goals Review  Weight Management/Obesity;Hypertension;Lipids;Stress;Heart Failure    Review  Pt with multiple CAD RFs willing to participate in  CR exercise. Michael Terrell is tolerating increased workloads well.  He has switched class times and has great attendance.  He is very pleased that his EF has increased and he does not have to wear the LifeVest.     Expected Outcomes  Pt will continue to participate in CR exercise, nutrition, and lifestyle modification opportunities to reduce overall RF.        ITP Comments: ITP Comments    Row Name 02/22/18 1027 02/26/18 0915 03/07/18 1530 04/04/18 1658     ITP Comments  Dr. Fransico Him, Medical Director  pt started group exercise today. pt  tolerated light activity without difficulty. pt oriented to exercise equipment and safety routine. pt vervbalized understanding.   30 day ITP review.    30 Day ITP Review. Michael Terrell is tolerating increased workloads well.  He has switched class times and has great attendance.  He is very pleased that his EF has increased and he does not have to wear the LifeVest.        Comments: See ITP Comments.

## 2018-04-06 ENCOUNTER — Encounter (HOSPITAL_COMMUNITY): Payer: BLUE CROSS/BLUE SHIELD

## 2018-04-06 ENCOUNTER — Ambulatory Visit (HOSPITAL_COMMUNITY): Payer: BLUE CROSS/BLUE SHIELD

## 2018-04-06 ENCOUNTER — Encounter (HOSPITAL_COMMUNITY)
Admission: RE | Admit: 2018-04-06 | Discharge: 2018-04-06 | Disposition: A | Discharge status: Home / Self Care | Payer: BLUE CROSS/BLUE SHIELD | Source: Ambulatory Visit | Attending: Cardiology | Admitting: Cardiology

## 2018-04-06 DIAGNOSIS — Z955 Presence of coronary angioplasty implant and graft: Secondary | ICD-10-CM

## 2018-04-06 DIAGNOSIS — Z79899 Other long term (current) drug therapy: Secondary | ICD-10-CM | POA: Diagnosis not present

## 2018-04-06 DIAGNOSIS — Z79890 Hormone replacement therapy (postmenopausal): Secondary | ICD-10-CM | POA: Diagnosis not present

## 2018-04-06 DIAGNOSIS — I2510 Atherosclerotic heart disease of native coronary artery without angina pectoris: Secondary | ICD-10-CM | POA: Diagnosis not present

## 2018-04-06 DIAGNOSIS — I10 Essential (primary) hypertension: Secondary | ICD-10-CM | POA: Diagnosis not present

## 2018-04-06 DIAGNOSIS — I2102 ST elevation (STEMI) myocardial infarction involving left anterior descending coronary artery: Secondary | ICD-10-CM | POA: Diagnosis not present

## 2018-04-06 DIAGNOSIS — Z7982 Long term (current) use of aspirin: Secondary | ICD-10-CM | POA: Diagnosis not present

## 2018-04-06 DIAGNOSIS — E785 Hyperlipidemia, unspecified: Secondary | ICD-10-CM | POA: Diagnosis not present

## 2018-04-09 ENCOUNTER — Encounter (HOSPITAL_COMMUNITY)
Admission: RE | Admit: 2018-04-09 | Discharge: 2018-04-09 | Disposition: A | Discharge status: Home / Self Care | Payer: BLUE CROSS/BLUE SHIELD | Source: Ambulatory Visit | Attending: Cardiology | Admitting: Cardiology

## 2018-04-09 ENCOUNTER — Encounter (HOSPITAL_COMMUNITY): Payer: BLUE CROSS/BLUE SHIELD

## 2018-04-09 ENCOUNTER — Ambulatory Visit (HOSPITAL_COMMUNITY): Payer: BLUE CROSS/BLUE SHIELD

## 2018-04-09 DIAGNOSIS — Z955 Presence of coronary angioplasty implant and graft: Secondary | ICD-10-CM

## 2018-04-09 DIAGNOSIS — I10 Essential (primary) hypertension: Secondary | ICD-10-CM | POA: Diagnosis not present

## 2018-04-09 DIAGNOSIS — I2510 Atherosclerotic heart disease of native coronary artery without angina pectoris: Secondary | ICD-10-CM | POA: Diagnosis not present

## 2018-04-09 DIAGNOSIS — Z79899 Other long term (current) drug therapy: Secondary | ICD-10-CM | POA: Diagnosis not present

## 2018-04-09 DIAGNOSIS — Z7982 Long term (current) use of aspirin: Secondary | ICD-10-CM | POA: Diagnosis not present

## 2018-04-09 DIAGNOSIS — I2102 ST elevation (STEMI) myocardial infarction involving left anterior descending coronary artery: Secondary | ICD-10-CM | POA: Diagnosis not present

## 2018-04-09 DIAGNOSIS — E785 Hyperlipidemia, unspecified: Secondary | ICD-10-CM | POA: Diagnosis not present

## 2018-04-09 DIAGNOSIS — Z79890 Hormone replacement therapy (postmenopausal): Secondary | ICD-10-CM | POA: Diagnosis not present

## 2018-04-11 ENCOUNTER — Ambulatory Visit (HOSPITAL_COMMUNITY): Payer: BLUE CROSS/BLUE SHIELD

## 2018-04-11 ENCOUNTER — Encounter (HOSPITAL_COMMUNITY): Payer: BLUE CROSS/BLUE SHIELD

## 2018-04-11 ENCOUNTER — Encounter (HOSPITAL_COMMUNITY)
Admission: RE | Admit: 2018-04-11 | Discharge: 2018-04-11 | Disposition: A | Discharge status: Home / Self Care | Payer: BLUE CROSS/BLUE SHIELD | Source: Ambulatory Visit | Attending: Cardiology | Admitting: Cardiology

## 2018-04-11 DIAGNOSIS — Z79899 Other long term (current) drug therapy: Secondary | ICD-10-CM | POA: Diagnosis not present

## 2018-04-11 DIAGNOSIS — I2102 ST elevation (STEMI) myocardial infarction involving left anterior descending coronary artery: Secondary | ICD-10-CM | POA: Diagnosis not present

## 2018-04-11 DIAGNOSIS — G4733 Obstructive sleep apnea (adult) (pediatric): Secondary | ICD-10-CM | POA: Diagnosis not present

## 2018-04-11 DIAGNOSIS — I10 Essential (primary) hypertension: Secondary | ICD-10-CM | POA: Diagnosis not present

## 2018-04-11 DIAGNOSIS — Z955 Presence of coronary angioplasty implant and graft: Secondary | ICD-10-CM

## 2018-04-11 DIAGNOSIS — Z79890 Hormone replacement therapy (postmenopausal): Secondary | ICD-10-CM | POA: Diagnosis not present

## 2018-04-11 DIAGNOSIS — Z7982 Long term (current) use of aspirin: Secondary | ICD-10-CM | POA: Diagnosis not present

## 2018-04-11 DIAGNOSIS — I2510 Atherosclerotic heart disease of native coronary artery without angina pectoris: Secondary | ICD-10-CM | POA: Diagnosis not present

## 2018-04-11 DIAGNOSIS — E785 Hyperlipidemia, unspecified: Secondary | ICD-10-CM | POA: Diagnosis not present

## 2018-04-13 ENCOUNTER — Encounter (HOSPITAL_COMMUNITY)
Admission: RE | Admit: 2018-04-13 | Discharge: 2018-04-13 | Disposition: A | Discharge status: Home / Self Care | Payer: BLUE CROSS/BLUE SHIELD | Source: Ambulatory Visit | Attending: Cardiology | Admitting: Cardiology

## 2018-04-13 ENCOUNTER — Ambulatory Visit (HOSPITAL_COMMUNITY): Payer: BLUE CROSS/BLUE SHIELD

## 2018-04-13 ENCOUNTER — Encounter (HOSPITAL_COMMUNITY): Payer: BLUE CROSS/BLUE SHIELD

## 2018-04-13 DIAGNOSIS — Z955 Presence of coronary angioplasty implant and graft: Secondary | ICD-10-CM

## 2018-04-13 DIAGNOSIS — Z7982 Long term (current) use of aspirin: Secondary | ICD-10-CM | POA: Diagnosis not present

## 2018-04-13 DIAGNOSIS — I2102 ST elevation (STEMI) myocardial infarction involving left anterior descending coronary artery: Secondary | ICD-10-CM | POA: Diagnosis not present

## 2018-04-13 DIAGNOSIS — I10 Essential (primary) hypertension: Secondary | ICD-10-CM | POA: Diagnosis not present

## 2018-04-13 DIAGNOSIS — Z79890 Hormone replacement therapy (postmenopausal): Secondary | ICD-10-CM | POA: Diagnosis not present

## 2018-04-13 DIAGNOSIS — E785 Hyperlipidemia, unspecified: Secondary | ICD-10-CM | POA: Diagnosis not present

## 2018-04-13 DIAGNOSIS — Z79899 Other long term (current) drug therapy: Secondary | ICD-10-CM | POA: Diagnosis not present

## 2018-04-13 DIAGNOSIS — I2510 Atherosclerotic heart disease of native coronary artery without angina pectoris: Secondary | ICD-10-CM | POA: Diagnosis not present

## 2018-04-16 ENCOUNTER — Encounter (HOSPITAL_COMMUNITY): Payer: BLUE CROSS/BLUE SHIELD

## 2018-04-16 ENCOUNTER — Ambulatory Visit (HOSPITAL_COMMUNITY): Payer: BLUE CROSS/BLUE SHIELD

## 2018-04-16 ENCOUNTER — Encounter (HOSPITAL_COMMUNITY)
Admission: RE | Admit: 2018-04-16 | Discharge: 2018-04-16 | Disposition: A | Discharge status: Home / Self Care | Payer: BLUE CROSS/BLUE SHIELD | Source: Ambulatory Visit | Attending: Cardiology | Admitting: Cardiology

## 2018-04-16 DIAGNOSIS — Z955 Presence of coronary angioplasty implant and graft: Secondary | ICD-10-CM | POA: Diagnosis not present

## 2018-04-16 DIAGNOSIS — I2510 Atherosclerotic heart disease of native coronary artery without angina pectoris: Secondary | ICD-10-CM | POA: Diagnosis not present

## 2018-04-16 DIAGNOSIS — I10 Essential (primary) hypertension: Secondary | ICD-10-CM | POA: Diagnosis not present

## 2018-04-16 DIAGNOSIS — I2102 ST elevation (STEMI) myocardial infarction involving left anterior descending coronary artery: Secondary | ICD-10-CM | POA: Diagnosis not present

## 2018-04-16 DIAGNOSIS — E785 Hyperlipidemia, unspecified: Secondary | ICD-10-CM | POA: Diagnosis not present

## 2018-04-16 DIAGNOSIS — Z79890 Hormone replacement therapy (postmenopausal): Secondary | ICD-10-CM | POA: Diagnosis not present

## 2018-04-16 DIAGNOSIS — Z7982 Long term (current) use of aspirin: Secondary | ICD-10-CM | POA: Diagnosis not present

## 2018-04-16 DIAGNOSIS — Z79899 Other long term (current) drug therapy: Secondary | ICD-10-CM | POA: Diagnosis not present

## 2018-04-18 ENCOUNTER — Ambulatory Visit (HOSPITAL_COMMUNITY): Payer: BLUE CROSS/BLUE SHIELD

## 2018-04-18 ENCOUNTER — Encounter (HOSPITAL_COMMUNITY)
Admission: RE | Admit: 2018-04-18 | Discharge: 2018-04-18 | Disposition: A | Discharge status: Home / Self Care | Payer: BLUE CROSS/BLUE SHIELD | Source: Ambulatory Visit | Attending: Cardiology | Admitting: Cardiology

## 2018-04-18 ENCOUNTER — Encounter (HOSPITAL_COMMUNITY): Payer: BLUE CROSS/BLUE SHIELD

## 2018-04-18 DIAGNOSIS — I10 Essential (primary) hypertension: Secondary | ICD-10-CM | POA: Diagnosis not present

## 2018-04-18 DIAGNOSIS — Z79899 Other long term (current) drug therapy: Secondary | ICD-10-CM | POA: Diagnosis not present

## 2018-04-18 DIAGNOSIS — I2510 Atherosclerotic heart disease of native coronary artery without angina pectoris: Secondary | ICD-10-CM | POA: Diagnosis not present

## 2018-04-18 DIAGNOSIS — I2102 ST elevation (STEMI) myocardial infarction involving left anterior descending coronary artery: Secondary | ICD-10-CM

## 2018-04-18 DIAGNOSIS — Z955 Presence of coronary angioplasty implant and graft: Secondary | ICD-10-CM

## 2018-04-18 DIAGNOSIS — E785 Hyperlipidemia, unspecified: Secondary | ICD-10-CM | POA: Diagnosis not present

## 2018-04-18 DIAGNOSIS — Z79890 Hormone replacement therapy (postmenopausal): Secondary | ICD-10-CM | POA: Diagnosis not present

## 2018-04-18 DIAGNOSIS — Z7982 Long term (current) use of aspirin: Secondary | ICD-10-CM | POA: Diagnosis not present

## 2018-04-20 ENCOUNTER — Ambulatory Visit (HOSPITAL_COMMUNITY): Payer: BLUE CROSS/BLUE SHIELD

## 2018-04-20 ENCOUNTER — Encounter (HOSPITAL_COMMUNITY)
Admission: RE | Admit: 2018-04-20 | Discharge: 2018-04-20 | Disposition: A | Discharge status: Home / Self Care | Payer: BLUE CROSS/BLUE SHIELD | Source: Ambulatory Visit | Attending: Cardiology | Admitting: Cardiology

## 2018-04-20 ENCOUNTER — Encounter (HOSPITAL_COMMUNITY): Payer: BLUE CROSS/BLUE SHIELD

## 2018-04-20 DIAGNOSIS — Z955 Presence of coronary angioplasty implant and graft: Secondary | ICD-10-CM | POA: Diagnosis not present

## 2018-04-20 DIAGNOSIS — I2102 ST elevation (STEMI) myocardial infarction involving left anterior descending coronary artery: Secondary | ICD-10-CM

## 2018-04-20 DIAGNOSIS — E785 Hyperlipidemia, unspecified: Secondary | ICD-10-CM | POA: Diagnosis not present

## 2018-04-20 DIAGNOSIS — Z79899 Other long term (current) drug therapy: Secondary | ICD-10-CM | POA: Diagnosis not present

## 2018-04-20 DIAGNOSIS — I2510 Atherosclerotic heart disease of native coronary artery without angina pectoris: Secondary | ICD-10-CM | POA: Diagnosis not present

## 2018-04-20 DIAGNOSIS — Z7982 Long term (current) use of aspirin: Secondary | ICD-10-CM | POA: Diagnosis not present

## 2018-04-20 DIAGNOSIS — Z79890 Hormone replacement therapy (postmenopausal): Secondary | ICD-10-CM | POA: Diagnosis not present

## 2018-04-20 DIAGNOSIS — I10 Essential (primary) hypertension: Secondary | ICD-10-CM | POA: Diagnosis not present

## 2018-04-22 ENCOUNTER — Encounter: Payer: Self-pay | Admitting: Adult Health

## 2018-04-23 ENCOUNTER — Encounter (HOSPITAL_COMMUNITY): Payer: BLUE CROSS/BLUE SHIELD

## 2018-04-23 ENCOUNTER — Encounter (HOSPITAL_COMMUNITY)
Admission: RE | Admit: 2018-04-23 | Discharge: 2018-04-23 | Disposition: A | Discharge status: Home / Self Care | Payer: BLUE CROSS/BLUE SHIELD | Source: Ambulatory Visit | Attending: Cardiology | Admitting: Cardiology

## 2018-04-23 ENCOUNTER — Ambulatory Visit (HOSPITAL_COMMUNITY): Payer: BLUE CROSS/BLUE SHIELD

## 2018-04-23 DIAGNOSIS — Z955 Presence of coronary angioplasty implant and graft: Secondary | ICD-10-CM | POA: Diagnosis not present

## 2018-04-23 DIAGNOSIS — Z7982 Long term (current) use of aspirin: Secondary | ICD-10-CM | POA: Diagnosis not present

## 2018-04-23 DIAGNOSIS — I2510 Atherosclerotic heart disease of native coronary artery without angina pectoris: Secondary | ICD-10-CM | POA: Diagnosis not present

## 2018-04-23 DIAGNOSIS — E785 Hyperlipidemia, unspecified: Secondary | ICD-10-CM | POA: Diagnosis not present

## 2018-04-23 DIAGNOSIS — I2102 ST elevation (STEMI) myocardial infarction involving left anterior descending coronary artery: Secondary | ICD-10-CM

## 2018-04-23 DIAGNOSIS — Z79899 Other long term (current) drug therapy: Secondary | ICD-10-CM | POA: Diagnosis not present

## 2018-04-23 DIAGNOSIS — I10 Essential (primary) hypertension: Secondary | ICD-10-CM | POA: Diagnosis not present

## 2018-04-23 DIAGNOSIS — Z79890 Hormone replacement therapy (postmenopausal): Secondary | ICD-10-CM | POA: Diagnosis not present

## 2018-04-23 NOTE — Progress Notes (Addendum)
[facility]  PATIENT: Michael Terrell DOB: 03-29-1955   REASON FOR VISIT: Follow-up for severe obstructive sleep apnea, new CPAP user HISTORY FROM: Patient    HISTORY OF PRESENT ILLNESS:UPDATE 2/25/2020CM Michael Terrell, 63 year old male returns for follow-up with history of obstructive sleep apnea here for initial CPAP compliance.  Patient reports no problem with his machine.  He reports he had a heart attack in October 2019.  He is currently going to rehab.  His CPAP was started the middle of December.  Compliance data dated 03/24/2018-04/22/2018 shows compliance greater than 4 hours 87%.  Average usage 4 hours 32 minutes.  Set pressure 10 cm EPR level 3 AHI 2.2.  ESS 12.  He returns for reevaluation   6/12/2019[name] is a 63 year old right-handed gentleman with an underlying medical history of coronary artery disease, status post MI, status post stent placement, reflux disease, hypertension, hyperlipidemia, and overweight state who was previously diagnosed with sleep apnea and placed on BiPAP therapy. He no longer uses his machine. He had sleep study testing in [place] in 2015. His baseline sleep study from June 2015 showed an AHI of 7.9 per hour, average oxygen saturation 95%, nadir of 90%. 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 He had a CPAP titration study on 03/13/2014. He was treated with CPAP and then BiPAP up to a pressure of 22/18. I reviewed your office note from 06/20/2017, which you kindly included.  He was started on auto BiPAP therapy. He no longer uses his machine. His download shows intermittent usage last year, last use in early November of last year. He was not consistent with his BiPAP use and averaged less than 4 hours of usage typically per night. His Epworth sleepiness score is 10 out of 24, fatigue score is 20 out of 63. He is married and lives with his wife. They have 2 children. He is a  nonsmoker and does not utilize alcohol, he drinks caffeine in the form of tea, 2-3 glasses per day on average. He has 2 dogs. His bedtime is around 10:30, rise time around 5:30. He does not have difficulty falling asleep but has difficulty staying asleep. He was originally on BiPAP but could not tolerate the pressure, auto BiPAP became more tolerable but he did not notice any improvement of his sleep difficulties including daytime tiredness, nonrestorative sleep, restless sleep. He has intermittent restless leg symptoms affecting his left lower extremity distally. He has no family history of OSA, his brother was tested at one point. His weight has been stable. He does not currently take any narcotic pain medication, took some last year when he broke his left humerus. He had surgery and hardware in place  REVIEW OF SYSTEMS: Full 14 system review of systems performed and notable only for those listed, all others are neg:  Constitutional: Fatigue Cardiovascular: neg Ear/Nose/Throat: neg  Skin: neg Eyes: neg Respiratory: neg Gastroitestinal: neg  Hematology/Lymphatic: Easy bruising Endocrine: neg Musculoskeletal:neg Allergy/Immunology: neg Neurological: neg Psychiatric: Depression anxiety Sleep : Severe obstructive sleep apnea with initial CPAP   ALLERGIES: Allergies  Allergen Reactions  . Metoprolol     Significant bradycardia  . Lisinopril Rash    HOME MEDICATIONS: Outpatient Medications Prior to Visit  Medication Sig Dispense Refill  . aspirin 81 MG tablet Take 81 mg by mouth daily.     . cetirizine (ZYRTEC) 10 MG tablet Take 10 mg by mouth daily as needed for allergies.     Marland Kitchen  Cholecalciferol (VITAMIN D-3 PO) Take 2,500 Units by mouth daily.    . Evolocumab (REPATHA SURECLICK) 914 MG/ML SOAJ Inject 140 mg into the skin every 14 (fourteen) days.    . isosorbide-hydrALAZINE (BIDIL) 20-37.5 MG tablet Take 1 tablet by mouth 2 (two) times daily.    Marland Kitchen levothyroxine (SYNTHROID,  LEVOTHROID) 75 MCG tablet Take 75 mcg by mouth daily before breakfast.    . losartan (COZAAR) 50 MG tablet Take 1 tablet (50 mg total) by mouth daily. 30 tablet 1  . montelukast (SINGULAIR) 10 MG tablet Take 10 mg by mouth at bedtime as needed.     . nitroGLYCERIN (NITROSTAT) 0.4 MG SL tablet Place 1 tablet (0.4 mg total) under the tongue every 5 (five) minutes as needed for chest pain. 25 tablet 3  . pantoprazole (PROTONIX) 20 MG tablet Take 1 tablet (20 mg total) by mouth daily. 30 tablet 6  . Polyvinyl Alcohol-Povidone (REFRESH OP) Place 1 drop into both eyes 3 (three) times daily as needed (for dry eyes).     . ticagrelor (BRILINTA) 90 MG TABS tablet Take 1 tablet (90 mg total) by mouth 2 (two) times daily. 60 tablet 3   No facility-administered medications prior to visit.     PAST MEDICAL HISTORY: Past Medical History:  Diagnosis Date  . Acute anterior wall MI ([place]) 10/2009  . CAD (coronary artery disease)    BMS to LAD (2011)  . Chest pain   . Dyslipidemia   . Heart attack ([place]) 11/2017  . Hyperlipidemia   . Hypertension   . Sinus pause   . Snoring     PAST SURGICAL HISTORY: Past Surgical History:  Procedure Laterality Date  . CARDIAC CATHETERIZATION  01/29/2010   patent LAD stent (3.5x41mm Vision - Dr. Corky Downs 11/03/2009); 50% prox PLA stenosis, 50% distal RCA stenosis (Dr. Norlene Duel)  . CORONARY ANGIOPLASTY WITH STENT PLACEMENT  11/13/2009   3.5x30mm Vision BMS to LAD (Dr. Corky Downs)  . CORONARY/GRAFT ACUTE MI REVASCULARIZATION N/A 12/04/2017   Procedure: Coronary/Graft Acute MI Revascularization;  Surgeon: Troy Sine, MD;  Location: [facility];  Service: Cardiovascular;  Laterality: N/A;  . KNEE SURGERY    . LEFT HEART CATH AND CORONARY ANGIOGRAPHY N/A 12/04/2017   Procedure: LEFT HEART CATH AND CORONARY ANGIOGRAPHY;  Surgeon: Troy Sine, MD;  Location: [facility];  Service: Cardiovascular;  Laterality: N/A;  . NM MYOCAR PERF WALL MOTION  02/2010    bruce myoview - normal pattern of perfusion, low risk, no ischemia demonstrated, low risk  . SHOULDER SURGERY    . TRANSTHORACIC ECHOCARDIOGRAM  02/2012   EF 78-29%, grade 1 diastolic dysfunction; mild MR; LA mildly dilated; upper limit borderline LA enlargement    FAMILY HISTORY: Family History  Problem Relation Terrell of Onset  . CAD Father        MI in his 82s    SOCIAL HISTORY: Social History   Socioeconomic History  . Marital status: Married    Spouse name: Not on file  . Number of children: 2  . Years of education: Not on file  . Highest education level: Master's degree (e.g., MA, MS, MEng, MEd, MSW, MBA)  Occupational History  . 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 Financial resource strain: Not very hard  . Food insecurity:    Worry: Never true    Inability: Never true  . Transportation needs:  Medical: No    Non-medical: No  Tobacco Use  . Smoking status: Never Smoker  . Smokeless tobacco: Never Used  Substance and Sexual Activity  . Alcohol use: No    Alcohol/week: 0.0 standard drinks  . Drug use: No  . Sexual activity: Not on file  Lifestyle  . Physical activity:    Days per week: 4 days    Minutes per session: 30 min  . Stress: To some extent  Relationships  . Social connections:    Talks on phone: Not on file    Gets together: Not on file    Attends religious service: Not on file    Active member of club or organization: Not on file    Attends meetings of clubs or organizations: Not on file    Relationship status: Not on file  . Intimate partner violence:    Fear of current or ex partner: Not on file    Emotionally abused: Not on file    Physically abused: Not on file    Forced sexual activity: Not on file  Other Topics Concern  . Not on file  Social History Narrative  . Not on file     PHYSICAL EXAM  Vitals:   04/24/18 0756  BP: 103/61  Pulse: 73  Weight: 176 lb 3.2 oz  (79.9 kg)  Height: 5\' 9"  (1.753 m)   Body mass index is 26.02 kg/m.  Generalized: Well developed, in no acute distress  Head: normocephalic and atraumatic,. Oropharynx benign  Neck: Supple,  Musculoskeletal: No deformity   Neurological examination   Mentation: Alert oriented to time, place, history taking. Attention span and concentration appropriate. Recent and remote memory intact.  Follows all commands speech and language fluent.   Cranial nerve II-XII: Pupils were equal round reactive to light extraocular movements were full, visual field were full on confrontational test. Facial sensation and strength were normal. hearing was intact to finger rubbing bilaterally. Uvula tongue midline. head turning and shoulder shrug were normal and symmetric.Tongue protrusion into cheek strength was normal. Motor: normal bulk and tone, full strength in the BUE, BLE,  Sensory: normal and symmetric to light touch,  Coordination: finger-nose-finger, heel-to-shin bilaterally, no dysmetria Gait and Station: Rising up from seated position without assistance, normal stance,  moderate stride, good arm swing, smooth turning, able to perform tiptoe, and heel walking without difficulty. Tandem gait is steady  DIAGNOSTIC DATA (LABS, IMAGING, TESTING) - I reviewed patient records, labs, notes, testing and imaging myself where available.  Lab Results  Component Value Date   WBC 8.6 12/05/2017   HGB 14.0 12/05/2017   HCT 42.4 12/05/2017   MCV 92.8 12/05/2017   PLT 212 12/05/2017      Component Value Date/Time   NA 134 (L) 12/06/2017 0804   K 4.1 12/06/2017 0804   CL 106 12/06/2017 0804   CO2 20 (L) 12/06/2017 0804   GLUCOSE 95 12/06/2017 0804   BUN 11 12/06/2017 0804   CREATININE 1.17 03/15/2018 0818   CREATININE 1.04 02/07/2014 0833   CALCIUM 8.7 (L) 12/06/2017 0804   PROT 6.6 12/04/2017 2221   ALBUMIN 3.7 12/04/2017 2221   AST 26 12/04/2017 2221   ALT 23 12/04/2017 2221   ALKPHOS 75 12/04/2017  2221   BILITOT 0.7 12/04/2017 2221   GFRNONAA >60 03/15/2018 0818   GFRAA >60 03/15/2018 0818   Lab Results  Component Value Date   CHOL 183 12/04/2017   HDL 31 (L) 12/04/2017   LDLCALC 109 (H)  12/04/2017   TRIG 215 (H) 12/04/2017   CHOLHDL 5.9 12/04/2017   Lab Results  Component Value Date   HGBA1C (H) 01/27/2010    5.7 (NOTE)                                                                       According to the ADA Clinical Practice Recommendations for 2011, when HbA1c is used as a screening test:   >=6.5%   Diagnostic of Diabetes Mellitus           (if abnormal result  is confirmed)  5.7-6.4%   Increased risk of developing Diabetes Mellitus  References:Diagnosis and Classification of Diabetes Mellitus,Diabetes XIPJ,8250,53(ZJQBH 1):S62-S69 and Standards of Medical Care in         Diabetes - 2011,Diabetes Care,2011,34  (Suppl 1):S11-S61.    Lab Results  Component Value Date   TSH 4.616 (H) 11/16/2012      ASSESSMENT AND PLAN  63 y.o. year old male  has a past medical history of Acute anterior wall MI ([place]) (10/2009), CAD (coronary artery disease), Chest pain, Dyslipidemia, Heart attack ([place]) (11/2017), Hyperlipidemia, Hypertension, Sinus pause, and Snoring. here to follow-up for several year obstructive sleep apnea here for initial CPAP compliance.  He has been on BiPAP in the past but had difficulty tolerating this and stopped it. CPAP data dated 03/24/2018-04/22/2018 shows compliance greater than 4 hours 87%.  Average usage 4 hours 32 minutes.  Set pressure 10 cm EPR level 3 AHI 2.2.  ESS 12.   PLAN: CPAP compliance 87% Continue same settings ESS remains high however patient had a heart attack recently and remains in cardiac rehab Follow-up in 3 to 4 months Michael Terrell, [facility], [facility], [facility] [address], [place], [place] [address] [phone]  I reviewed the above note and documentation by the Nurse Practitioner and agree with  the history, physical exam, assessment and plan as outlined above. I was immediately available for face-to-face consultation. Michael Age, MD, PhD [facility])

## 2018-04-24 ENCOUNTER — Encounter: Payer: Self-pay | Admitting: Nurse Practitioner

## 2018-04-24 ENCOUNTER — Ambulatory Visit (INDEPENDENT_AMBULATORY_CARE_PROVIDER_SITE_OTHER): Payer: BLUE CROSS/BLUE SHIELD | Admitting: Nurse Practitioner

## 2018-04-24 DIAGNOSIS — Z9989 Dependence on other enabling machines and devices: Secondary | ICD-10-CM

## 2018-04-24 DIAGNOSIS — G4733 Obstructive sleep apnea (adult) (pediatric): Secondary | ICD-10-CM

## 2018-04-24 NOTE — Patient Instructions (Signed)
CPAP compliance 87% Continue same settings Follow-up in 3 to 4 months

## 2018-04-25 ENCOUNTER — Encounter (HOSPITAL_COMMUNITY)
Admission: RE | Admit: 2018-04-25 | Discharge: 2018-04-25 | Disposition: A | Discharge status: Home / Self Care | Payer: BLUE CROSS/BLUE SHIELD | Source: Ambulatory Visit | Attending: Cardiology | Admitting: Cardiology

## 2018-04-25 ENCOUNTER — Ambulatory Visit (HOSPITAL_COMMUNITY): Payer: BLUE CROSS/BLUE SHIELD

## 2018-04-25 ENCOUNTER — Encounter (HOSPITAL_COMMUNITY): Payer: BLUE CROSS/BLUE SHIELD

## 2018-04-25 DIAGNOSIS — Z79899 Other long term (current) drug therapy: Secondary | ICD-10-CM | POA: Diagnosis not present

## 2018-04-25 DIAGNOSIS — I2102 ST elevation (STEMI) myocardial infarction involving left anterior descending coronary artery: Secondary | ICD-10-CM | POA: Diagnosis not present

## 2018-04-25 DIAGNOSIS — Z7982 Long term (current) use of aspirin: Secondary | ICD-10-CM | POA: Diagnosis not present

## 2018-04-25 DIAGNOSIS — I2510 Atherosclerotic heart disease of native coronary artery without angina pectoris: Secondary | ICD-10-CM | POA: Diagnosis not present

## 2018-04-25 DIAGNOSIS — I10 Essential (primary) hypertension: Secondary | ICD-10-CM | POA: Diagnosis not present

## 2018-04-25 DIAGNOSIS — Z79890 Hormone replacement therapy (postmenopausal): Secondary | ICD-10-CM | POA: Diagnosis not present

## 2018-04-25 DIAGNOSIS — Z955 Presence of coronary angioplasty implant and graft: Secondary | ICD-10-CM

## 2018-04-25 DIAGNOSIS — E785 Hyperlipidemia, unspecified: Secondary | ICD-10-CM | POA: Diagnosis not present

## 2018-04-27 ENCOUNTER — Encounter (HOSPITAL_COMMUNITY): Payer: BLUE CROSS/BLUE SHIELD

## 2018-04-27 ENCOUNTER — Ambulatory Visit (HOSPITAL_COMMUNITY): Payer: BLUE CROSS/BLUE SHIELD

## 2018-04-27 ENCOUNTER — Encounter (HOSPITAL_COMMUNITY)
Admission: RE | Admit: 2018-04-27 | Discharge: 2018-04-27 | Disposition: A | Discharge status: Home / Self Care | Payer: BLUE CROSS/BLUE SHIELD | Source: Ambulatory Visit | Attending: Cardiology | Admitting: Cardiology

## 2018-04-27 DIAGNOSIS — I10 Essential (primary) hypertension: Secondary | ICD-10-CM | POA: Diagnosis not present

## 2018-04-27 DIAGNOSIS — Z79899 Other long term (current) drug therapy: Secondary | ICD-10-CM | POA: Diagnosis not present

## 2018-04-27 DIAGNOSIS — E785 Hyperlipidemia, unspecified: Secondary | ICD-10-CM | POA: Diagnosis not present

## 2018-04-27 DIAGNOSIS — Z79890 Hormone replacement therapy (postmenopausal): Secondary | ICD-10-CM | POA: Diagnosis not present

## 2018-04-27 DIAGNOSIS — I2102 ST elevation (STEMI) myocardial infarction involving left anterior descending coronary artery: Secondary | ICD-10-CM

## 2018-04-27 DIAGNOSIS — Z955 Presence of coronary angioplasty implant and graft: Secondary | ICD-10-CM | POA: Diagnosis not present

## 2018-04-27 DIAGNOSIS — I2510 Atherosclerotic heart disease of native coronary artery without angina pectoris: Secondary | ICD-10-CM | POA: Diagnosis not present

## 2018-04-27 DIAGNOSIS — Z7982 Long term (current) use of aspirin: Secondary | ICD-10-CM | POA: Diagnosis not present

## 2018-04-30 ENCOUNTER — Encounter (HOSPITAL_COMMUNITY): Payer: BLUE CROSS/BLUE SHIELD

## 2018-04-30 ENCOUNTER — Ambulatory Visit (HOSPITAL_COMMUNITY): Payer: BLUE CROSS/BLUE SHIELD

## 2018-04-30 ENCOUNTER — Encounter (HOSPITAL_COMMUNITY)
Admission: RE | Admit: 2018-04-30 | Discharge: 2018-04-30 | Disposition: A | Discharge status: Home / Self Care | Payer: BLUE CROSS/BLUE SHIELD | Source: Ambulatory Visit | Attending: Cardiology | Admitting: Cardiology

## 2018-04-30 DIAGNOSIS — I10 Essential (primary) hypertension: Secondary | ICD-10-CM | POA: Diagnosis not present

## 2018-04-30 DIAGNOSIS — I2102 ST elevation (STEMI) myocardial infarction involving left anterior descending coronary artery: Secondary | ICD-10-CM

## 2018-04-30 DIAGNOSIS — Z79890 Hormone replacement therapy (postmenopausal): Secondary | ICD-10-CM | POA: Diagnosis not present

## 2018-04-30 DIAGNOSIS — Z79899 Other long term (current) drug therapy: Secondary | ICD-10-CM | POA: Diagnosis not present

## 2018-04-30 DIAGNOSIS — I2510 Atherosclerotic heart disease of native coronary artery without angina pectoris: Secondary | ICD-10-CM | POA: Insufficient documentation

## 2018-04-30 DIAGNOSIS — Z955 Presence of coronary angioplasty implant and graft: Secondary | ICD-10-CM | POA: Diagnosis not present

## 2018-04-30 DIAGNOSIS — Z7982 Long term (current) use of aspirin: Secondary | ICD-10-CM | POA: Insufficient documentation

## 2018-04-30 DIAGNOSIS — E785 Hyperlipidemia, unspecified: Secondary | ICD-10-CM | POA: Insufficient documentation

## 2018-05-02 ENCOUNTER — Ambulatory Visit (HOSPITAL_COMMUNITY): Payer: BLUE CROSS/BLUE SHIELD

## 2018-05-02 ENCOUNTER — Encounter (HOSPITAL_COMMUNITY): Payer: BLUE CROSS/BLUE SHIELD

## 2018-05-02 ENCOUNTER — Encounter (HOSPITAL_COMMUNITY)
Admission: RE | Admit: 2018-05-02 | Discharge: 2018-05-02 | Disposition: A | Discharge status: Home / Self Care | Payer: BLUE CROSS/BLUE SHIELD | Source: Ambulatory Visit | Attending: Cardiology | Admitting: Cardiology

## 2018-05-02 DIAGNOSIS — Z955 Presence of coronary angioplasty implant and graft: Secondary | ICD-10-CM | POA: Diagnosis not present

## 2018-05-02 DIAGNOSIS — I2102 ST elevation (STEMI) myocardial infarction involving left anterior descending coronary artery: Secondary | ICD-10-CM

## 2018-05-02 DIAGNOSIS — Z79899 Other long term (current) drug therapy: Secondary | ICD-10-CM | POA: Diagnosis not present

## 2018-05-02 DIAGNOSIS — Z79890 Hormone replacement therapy (postmenopausal): Secondary | ICD-10-CM | POA: Diagnosis not present

## 2018-05-02 DIAGNOSIS — I2510 Atherosclerotic heart disease of native coronary artery without angina pectoris: Secondary | ICD-10-CM | POA: Diagnosis not present

## 2018-05-02 DIAGNOSIS — I10 Essential (primary) hypertension: Secondary | ICD-10-CM | POA: Diagnosis not present

## 2018-05-02 DIAGNOSIS — E785 Hyperlipidemia, unspecified: Secondary | ICD-10-CM | POA: Diagnosis not present

## 2018-05-02 DIAGNOSIS — Z7982 Long term (current) use of aspirin: Secondary | ICD-10-CM | POA: Diagnosis not present

## 2018-05-03 NOTE — Progress Notes (Signed)
Cardiac Individual Treatment Plan  Patient Details  Name: Michael Terrell MRN: [id number] Date of Birth: 1956/01/25 Referring Provider:     CARDIAC REHAB PHASE II ORIENTATION from 02/22/2018 in [facility]  Referring Provider  Dr. Virgina Jock      Initial Encounter Date:    CARDIAC REHAB PHASE II ORIENTATION from 02/22/2018 in [facility]  Date  02/22/18      Visit Diagnosis: Status post coronary artery stent placement, S/P DES LAD 12/04/17  STEMI involving left anterior descending coronary artery ([place]) 12/04/17  Patient's Home Medications on Admission:  Current Outpatient Medications:  .  aspirin 81 MG tablet, Take 81 mg by mouth daily. , Disp: , Rfl:  .  cetirizine (ZYRTEC) 10 MG tablet, Take 10 mg by mouth daily as needed for allergies. , Disp: , Rfl:  .  Cholecalciferol (VITAMIN D-3 PO), Take 2,500 Units by mouth daily., Disp: , Rfl:  .  Evolocumab (REPATHA SURECLICK) 387 MG/ML SOAJ, Inject 140 mg into the skin every 14 (fourteen) days., Disp: , Rfl:  .  isosorbide-hydrALAZINE (BIDIL) 20-37.5 MG tablet, Take 1 tablet by mouth 2 (two) times daily., Disp: , Rfl:  .  levothyroxine (SYNTHROID, LEVOTHROID) 75 MCG tablet, Take 75 mcg by mouth daily before breakfast., Disp: , Rfl:  .  losartan (COZAAR) 50 MG tablet, Take 1 tablet (50 mg total) by mouth daily., Disp: 30 tablet, Rfl: 1 .  montelukast (SINGULAIR) 10 MG tablet, Take 10 mg by mouth at bedtime as needed. , Disp: , Rfl:  .  nitroGLYCERIN (NITROSTAT) 0.4 MG SL tablet, Place 1 tablet (0.4 mg total) under the tongue every 5 (five) minutes as needed for chest pain., Disp: 25 tablet, Rfl: 3 .  pantoprazole (PROTONIX) 20 MG tablet, Take 1 tablet (20 mg total) by mouth daily., Disp: 30 tablet, Rfl: 6 .  Polyvinyl Alcohol-Povidone (REFRESH OP), Place 1 drop into both eyes 3 (three) times daily as needed (for dry eyes). , Disp: , Rfl:  .  ticagrelor (BRILINTA) 90 MG TABS  tablet, Take 1 tablet (90 mg total) by mouth 2 (two) times daily., Disp: 60 tablet, Rfl: 3  Past Medical History: Past Medical History:  Diagnosis Date  . Acute anterior wall MI ([place]) 10/2009  . CAD (coronary artery disease)    BMS to LAD (2011)  . Chest pain   . Dyslipidemia   . Heart attack ([place]) 11/2017  . Hyperlipidemia   . Hypertension   . Sinus pause   . Snoring     Tobacco Use: Social History   Tobacco Use  Smoking Status Never Smoker  Smokeless Tobacco Never Used    Labs: Recent Review Flowsheet Data    Labs for ITP Cardiac and Pulmonary Rehab Latest Ref Rng & Units 07/09/2013 09/24/2013 02/07/2014 12/04/2017 12/05/2017   Cholestrol 0 - 200 mg/dL 146 130 133 183 -   LDLCALC 0 - 99 mg/dL 78 70 71 109(H) -   HDL >40 mg/dL 29(L) 39(L) 38(L) 31(L) -   Trlycerides <150 mg/dL 196(H) 104 119 215(H) -   Hemoglobin A1c <5.7 % - - - - -   TCO2 22 - 32 mmol/L - - - - 22      Capillary Blood Glucose: No results found for: GLUCAP   Exercise Target Goals: Exercise Program Goal: Individual exercise prescription set using results from initial 6 min walk test and THRR while considering  patient's activity barriers and safety.   Exercise Prescription  Goal: Initial exercise prescription builds to 30-45 minutes a day of aerobic activity, 2-3 days per week.  Home exercise guidelines will be given to patient during program as part of exercise prescription that the participant will acknowledge.  Activity Barriers & Risk Stratification: Activity Barriers & Cardiac Risk Stratification - 02/22/18 0951      Activity Barriers & Cardiac Risk Stratification   Activity Barriers  Other (comment)    Comments  Life Vest     Cardiac Risk Stratification  High       6 Minute Walk: 6 Minute Walk    Row Name 02/22/18 0950         6 Minute Walk   Phase  Initial     Distance  1952 feet     Walk Time  6 minutes     # of Rest Breaks  0     MPH  3.7     METS  4.8     RPE  12     VO2  Peak  16.85     Symptoms  No     Resting HR  89 bpm     Resting BP  118/72     Resting Oxygen Saturation   100 %     Exercise Oxygen Saturation  during 6 min walk  99 %     Max Ex. HR  124 bpm     Max Ex. BP  140/80     2 Minute Post BP  124/72        Oxygen Initial Assessment:   Oxygen Re-Evaluation:   Oxygen Discharge (Final Oxygen Re-Evaluation):   Initial Exercise Prescription: Initial Exercise Prescription - 02/22/18 1000      Date of Initial Exercise RX and Referring Provider   Date  02/22/18    Referring Provider  Dr. Virgina Jock    Expected Discharge Date  06/01/18      Treadmill   MPH  3    Grade  2    Minutes  10      Recumbant Bike   Level  1.5    Watts  70    Minutes  10    METs  4.75      NuStep   Level  4    SPM  85    Minutes  10    METs  4.6      Prescription Details   Frequency (times per week)  3    Duration  Progress to 30 minutes of continuous aerobic without signs/symptoms of physical distress      Intensity   THRR 40-80% of Max Heartrate  63-126    Ratings of Perceived Exertion  11-13      Progression   Progression  Continue to progress workloads to maintain intensity without signs/symptoms of physical distress.      Resistance Training   Training Prescription  Yes    Weight  4 lbs.    Reps  10-15       Perform Capillary Blood Glucose checks as needed.  Exercise Prescription Changes: Exercise Prescription Changes    Row Name 02/26/18 1200 03/07/18 1702 03/16/18 1659 03/30/18 1000 04/09/18 1428     Response to Exercise   Blood Pressure (Admit)  116/78  106/62  126/74  112/68  110/70   Blood Pressure (Exercise)  142/68  150/80  134/60  124/60  142/60   Blood Pressure (Exit)  108/72  124/84  110/62  110/70  102/60   Heart  Rate (Admit)  92 bpm  92 bpm  84 bpm  88 bpm  78 bpm   Heart Rate (Exercise)  126 bpm  137 bpm  142 bpm  134 bpm  147 bpm   Heart Rate (Exit)  92 bpm  89 bpm  91 bpm  89 bpm  85 bpm   Rating of Perceived  Exertion (Exercise)  13  13  13  14  14    Perceived Dyspnea (Exercise)  -  -  -  0  0   Symptoms  -  None  None  None   None    Comments  Pt first day of exercise  None  -  None  Elliptical Added to Prescription   Duration  Progress to 30 minutes of  aerobic without signs/symptoms of physical distress  Progress to 30 minutes of  aerobic without signs/symptoms of physical distress  Progress to 30 minutes of  aerobic without signs/symptoms of physical distress  Progress to 45 minutes of aerobic exercise without signs/symptoms of physical distress  Progress to 45 minutes of aerobic exercise without signs/symptoms of physical distress   Intensity  THRR unchanged  THRR unchanged  THRR unchanged  THRR unchanged  THRR unchanged     Progression   Progression  Continue to progress workloads to maintain intensity without signs/symptoms of physical distress.  Continue to progress workloads to maintain intensity without signs/symptoms of physical distress.  Continue to progress workloads to maintain intensity without signs/symptoms of physical distress.  Continue to progress workloads to maintain intensity without signs/symptoms of physical distress.  Continue to progress workloads to maintain intensity without signs/symptoms of physical distress.   Average METs  3.06  4.97  3.06  5.89  7.6     Resistance Training   Training Prescription  Yes  Yes  Yes  Yes  Yes   Weight  4 lbs.  4 lbs.  4 lbs.  5lbs  5lbs   Reps  10-15  10-15  10-15  10-15  10-15   Time  10 Minutes  10 Minutes  10 Minutes  10 Minutes  10 Minutes     Treadmill   MPH  3  3  3   3.3  -   Grade  2  2  2  3   -   Minutes  10  10  10  10   -   METs  -  4.12  4.12  4.89  -     Recumbant Bike   Level  1.5  3  3  3  4    Watts  70  70  85  95  120   Minutes  10  10  10  10  10    METs  4.75  6  6.2  5.9  8.4     NuStep   Level  4  4  4  5  5    SPM  85  85  95  95  120   Minutes  10  10  10  10  10    METs  4.6  4.8  4.9  6.5  6.5      Elliptical   Level  -  -  -  -  2   Speed  -  -  -  -  2   Minutes  -  -  -  -  10   METs  -  -  -  -  7.8     Home  Exercise Plan   Plans to continue exercise at  -  -  -  Home (comment) Walking  Home (comment) Walking   Frequency  -  -  -  Add 2 additional days to program exercise sessions.  Add 2 additional days to program exercise sessions.   Initial Home Exercises Provided  -  -  -  03/21/18  03/21/18   Row Name 04/20/18 1430             Response to Exercise   Blood Pressure (Admit)  119/68       Blood Pressure (Exercise)  148/60       Blood Pressure (Exit)  110/60       Heart Rate (Admit)  85 bpm       Heart Rate (Exercise)  148 bpm       Heart Rate (Exit)  85 bpm       Rating of Perceived Exertion (Exercise)  15       Perceived Dyspnea (Exercise)  0       Symptoms  None        Comments  None       Duration  Progress to 45 minutes of aerobic exercise without signs/symptoms of physical distress       Intensity  THRR unchanged         Progression   Progression  Continue to progress workloads to maintain intensity without signs/symptoms of physical distress.       Average METs  8         Resistance Training   Training Prescription  Yes       Weight  5lbs       Reps  10-15       Time  10 Minutes         Recumbant Bike   Level  5       Watts  120       Minutes  10       METs  9.3         NuStep   Level  5       SPM  95       Minutes  10       METs  6.5         Elliptical   Level  2       Speed  2       Minutes  10       METs  8.1         Home Exercise Plan   Plans to continue exercise at  Home (comment) Walking       Frequency  Add 2 additional days to program exercise sessions.       Initial Home Exercises Provided  03/21/18          Exercise Comments: Exercise Comments    Row Name 02/26/18 1238 03/21/18 1350 04/30/18 1029       Exercise Comments  Pt first day of exercise. Tolerated well.   Reveiwed HEP with pt. Pt is currently exercising most  days of the week. Will continue to monitor and progress pt as tolerated.  Reviewed METs and Goals with pt. Pt is making great strides in progress. Will continue to monitor.         Exercise Goals and Review: Exercise Goals    Row Name 02/22/18 [phone]             Exercise Goals   Increase Physical  Activity  Yes       Intervention  Provide advice, education, support and counseling about physical activity/exercise needs.;Develop an individualized exercise prescription for aerobic and resistive training based on initial evaluation findings, risk stratification, comorbidities and participant's personal goals.       Expected Outcomes  Short Term: Attend rehab on a regular basis to increase amount of physical activity.       Increase Strength and Stamina  Yes       Intervention  Provide advice, education, support and counseling about physical activity/exercise needs.;Develop an individualized exercise prescription for aerobic and resistive training based on initial evaluation findings, risk stratification, comorbidities and participant's personal goals.       Expected Outcomes  Short Term: Increase workloads from initial exercise prescription for resistance, speed, and METs.       Able to understand and use rate of perceived exertion (RPE) scale  Yes       Intervention  Provide education and explanation on how to use RPE scale       Expected Outcomes  Short Term: Able to use RPE daily in rehab to express subjective intensity level;Long Term:  Able to use RPE to guide intensity level when exercising independently       Knowledge and understanding of Target Heart Rate Range (THRR)  Yes       Intervention  Provide education and explanation of THRR including how the numbers were predicted and where they are located for reference       Expected Outcomes  Short Term: Able to state/look up THRR;Long Term: Able to use THRR to govern intensity when exercising independently;Short Term: Able to use daily as  guideline for intensity in rehab       Able to check pulse independently  Yes       Intervention  Provide education and demonstration on how to check pulse in carotid and radial arteries.;Review the importance of being able to check your own pulse for safety during independent exercise       Expected Outcomes  Short Term: Able to explain why pulse checking is important during independent exercise;Long Term: Able to check pulse independently and accurately       Understanding of Exercise Prescription  Yes       Intervention  Provide education, explanation, and written materials on patient's individual exercise prescription       Expected Outcomes  Short Term: Able to explain program exercise prescription;Long Term: Able to explain home exercise prescription to exercise independently          Exercise Goals Re-Evaluation : Exercise Goals Re-Evaluation    [place] Name 02/26/18 1237 03/21/18 1351 04/30/18 1029         Exercise Goal Re-Evaluation   Exercise Goals Review  Increase Physical Activity;Increase Strength and Stamina;Able to check pulse independently;Knowledge and understanding of Target Heart Rate Range (THRR);Able to understand and use rate of perceived exertion (RPE) scale;Understanding of Exercise Prescription  Increase Physical Activity;Able to understand and use rate of perceived exertion (RPE) scale;Knowledge and understanding of Target Heart Rate Range (THRR);Understanding of Exercise Prescription;Increase Strength and Stamina;Able to check pulse independently  Increase Physical Activity;Understanding of Exercise Prescription     Comments  Pt first day of exercise. Tolerated exercise well. Will progress as tolerated.   Reviewed HEP with pt. Also reviewed THRR, RPE Scale, weather conditions, endpoints of exercise, NTG use, when to call 911, warmup and cool down.   Has added elliptical to pt's exercise prescription. Pt  is repsonding well to the new challenge of machine. Will add rower to  pt's exercise prescription this week. Will continue to monitor pt.      Expected Outcomes  Will monitor and progress Pt as tolerated.   Pt will continue to walk for exercise 6-7 days a week for 30-45 minutes. Pt will continue to improve cardiovascular strength. Will continue to monitor.  Pt will continue to exercise most days of the week for 30-45 minutes. Pt will continue to increase strength and stamina.        Discharge Exercise Prescription (Final Exercise Prescription Changes): Exercise Prescription Changes - 04/20/18 1430      Response to Exercise   Blood Pressure (Admit)  119/68    Blood Pressure (Exercise)  148/60    Blood Pressure (Exit)  110/60    Heart Rate (Admit)  85 bpm    Heart Rate (Exercise)  148 bpm    Heart Rate (Exit)  85 bpm    Rating of Perceived Exertion (Exercise)  15    Perceived Dyspnea (Exercise)  0    Symptoms  None     Comments  None    Duration  Progress to 45 minutes of aerobic exercise without signs/symptoms of physical distress    Intensity  THRR unchanged      Progression   Progression  Continue to progress workloads to maintain intensity without signs/symptoms of physical distress.    Average METs  8      Resistance Training   Training Prescription  Yes    Weight  5lbs    Reps  10-15    Time  10 Minutes      Recumbant Bike   Level  5    Watts  120    Minutes  10    METs  9.3      NuStep   Level  5    SPM  95    Minutes  10    METs  6.5      Elliptical   Level  2    Speed  2    Minutes  10    METs  8.1      Home Exercise Plan   Plans to continue exercise at  Home (comment)   Walking   Frequency  Add 2 additional days to program exercise sessions.    Initial Home Exercises Provided  03/21/18       Nutrition:  Target Goals: Understanding of nutrition guidelines, daily intake of sodium 1500mg , cholesterol 200mg , calories 30% from fat and 7% or less from saturated fats, daily to have 5 or more servings of fruits and  vegetables.  Biometrics: Pre Biometrics - 02/22/18 0952      Pre Biometrics   Height  5' 8.5" (1.74 m)    Weight  80.6 kg    Waist Circumference  39 inches    Hip Circumference  39.5 inches    Waist to Hip Ratio  0.99 %    BMI (Calculated)  26.62    Triceps Skinfold  22 mm    % Body Fat  28.1 %    Grip Strength  40 kg    Flexibility  7 in    Single Leg Stand  30 seconds        Nutrition Therapy Plan and Nutrition Goals: Nutrition Therapy & Goals - 02/22/18 0904      Nutrition Therapy   Diet  heart healthy      Personal Nutrition Goals  Nutrition Goal  Pt to identify and limit food sources of saturated fat, trans fat, refined carbohydrates and sodium    Personal Goal #2  Pt to identify food quantities necessary to achieve weight loss of 6-24 lbs. at graduation from cardiac rehab.    Personal Goal #3  Pt to eat more and a variety of non-starchy vegetables    Personal Goal #4  Pt to go easy on high fat cheeses.      Intervention Plan   Intervention  Prescribe, educate and counsel regarding individualized specific dietary modifications aiming towards targeted core components such as weight, hypertension, lipid management, diabetes, heart failure and other comorbidities.    Expected Outcomes  Short Term Goal: Understand basic principles of dietary content, such as calories, fat, sodium, cholesterol and nutrients.;Long Term Goal: Adherence to prescribed nutrition plan.       Nutrition Assessments: Nutrition Assessments - 02/22/18 0905      MEDFICTS Scores   Pre Score  18       Nutrition Goals Re-Evaluation:   Nutrition Goals Re-Evaluation:   Nutrition Goals Discharge (Final Nutrition Goals Re-Evaluation):   Psychosocial: Target Goals: Acknowledge presence or absence of significant depression and/or stress, maximize coping skills, provide positive support system. Participant is able to verbalize types and ability to use techniques and skills needed for reducing  stress and depression.  Initial Review & Psychosocial Screening: Initial Psych Review & Screening - 02/22/18 0927      Initial Review   Current issues with  Current Stress Concerns;Current Anxiety/Panic;Current Depression    Source of Stress Concerns  Chronic Illness   Elta Guadeloupe has current concerns related to his treatment plan and the continued need for a Life Vest.      [place]?  Yes   Pt lists his wife, daughter, son, and brother as sources of support.      Barriers   Psychosocial barriers to participate in program  The patient should benefit from training in stress management and relaxation.      Screening Interventions   Interventions  Encouraged to exercise;To provide support and resources with identified psychosocial needs   Pt states that he is able to manage his stress, depression, and anxiety on his own.       Quality of Life Scores: Quality of Life - 02/22/18 0826      Quality of Life   Select  Quality of Life      Quality of Life Scores   Health/Function Pre  19.6 %    Socioeconomic Pre  23.75 %    Psych/Spiritual Pre  21.21 %    Family Pre  27.6 %    GLOBAL Pre  21.91 %      Scores of 19 and below usually indicate a poorer quality of life in these areas.  A difference of  2-3 points is a clinically meaningful difference.  A difference of 2-3 points in the total score of the Quality of Life Index has been associated with significant improvement in overall quality of life, self-image, physical symptoms, and general health in studies assessing change in quality of life.  PHQ-9: Recent Review Flowsheet Data    Depression screen [facility] 2/9 02/26/2018   Decreased Interest 1   Down, Depressed, Hopeless 2   PHQ - 2 Score 3   Altered sleeping 2   Tired, decreased energy 2   Change in appetite 0   Feeling bad or failure about yourself  1  Trouble concentrating 0   Moving slowly or fidgety/restless 1   Suicidal thoughts 0   PHQ-9 Score 9    Difficult doing work/chores Somewhat difficult     Interpretation of Total Score  Total Score Depression Severity:  1-4 = Minimal depression, 5-9 = Mild depression, 10-14 = Moderate depression, 15-19 = Moderately severe depression, 20-27 = Severe depression   Psychosocial Evaluation and Intervention: Psychosocial Evaluation - 02/26/18 0917      Psychosocial Evaluation & Interventions   Interventions  Stress management education;Encouraged to exercise with the program and follow exercise prescription;Relaxation education    Comments  pt with health related anxiety demonstrates associated depression symptoms. appt scheduled with Jeanella Craze 03/07/2018 @ 9:30.  pt enjoys coaching elementary cross country, reading and listening to music.     Expected Outcomes  pt will exhibit improved depression symptoms, good coping skills and positive outlook.     Continue Psychosocial Services   Follow up required by staff       Psychosocial Re-Evaluation: Psychosocial Re-Evaluation    [place] Name 04/04/18 1610 04/26/18 1704           Psychosocial Re-Evaluation   Current issues with  Current Depression  None Identified      Comments  Elta Guadel[place] is no longer required to wear his LifeVest.  He is pleased by this.  No psychosocial interventions necessary.      Expected Outcomes  Elta Guadeloupe will continue to exhibit a positive outlook with good coping skills.  Elta Guadeloupe will continue to exhibit a positive outlook with good coping skills.      Interventions  Stress management education;Relaxation education;Encouraged to attend Cardiac Rehabilitation for the exercise  Stress management education;Relaxation education;Encouraged to attend Cardiac Rehabilitation for the exercise      Continue Psychosocial Services   Follow up required by staff  No Follow up required         Psychosocial Discharge (Final Psychosocial Re-Evaluation): Psychosocial Re-Evaluation - 04/26/18 1704      Psychosocial Re-Evaluation   Current issues  with  None Identified    Comments  No psychosocial interventions necessary.    Expected Outcomes  Elta Guadeloupe will continue to exhibit a positive outlook with good coping skills.    Interventions  Stress management education;Relaxation education;Encouraged to attend Cardiac Rehabilitation for the exercise    Continue Psychosocial Services   No Follow up required       Vocational Rehabilitation: Provide vocational rehab assistance to qualifying candidates.   Vocational Rehab Evaluation & Intervention: Vocational Rehab - 02/22/18 0921      Initial Vocational Rehab Evaluation & Intervention   Assessment shows need for Vocational Rehabilitation  No       Education: Education Goals: Education classes will be provided on a weekly basis, covering required topics. Participant will state understanding/return demonstration of topics presented.  Learning Barriers/Preferences: Learning Barriers/Preferences - 02/22/18 0953      Learning Barriers/Preferences   Learning Barriers  Sight    Learning Preferences  Pictoral;Video;Written Material       Education Topics: Count Your Pulse:  -Group instruction provided by verbal instruction, demonstration, patient participation and written materials to support subject.  Instructors address importance of being able to find your pulse and how to count your pulse when at home without a heart monitor.  Patients get hands on experience counting their pulse with staff help and individually.   CARDIAC REHAB PHASE II EXERCISE from 04/25/2018 in [facility]  Date  03/23/18  Instruction Review Code  2- Demonstrated Understanding      Heart Attack, Angina, and Risk Factor Modification:  -Group instruction provided by verbal instruction, video, and written materials to support subject.  Instructors address signs and symptoms of angina and heart attacks.    Also discuss risk factors for heart disease and how to make changes to improve  heart health risk factors.   Functional Fitness:  -Group instruction provided by verbal instruction, demonstration, patient participation, and written materials to support subject.  Instructors address safety measures for doing things around the house.  Discuss how to get up and down off the floor, how to pick things up properly, how to safely get out of a chair without assistance, and balance training.   CARDIAC REHAB PHASE II EXERCISE from 04/25/2018 in [facility]  Date  04/20/18  Educator  EP  Instruction Review Code  2- Demonstrated Understanding      Meditation and Mindfulness:  -Group instruction provided by verbal instruction, patient participation, and written materials to support subject.  Instructor addresses importance of mindfulness and meditation practice to help reduce stress and improve awareness.  Instructor also leads participants through a meditation exercise.    CARDIAC REHAB PHASE II EXERCISE from 04/25/2018 in [facility]  Date  03/14/18  Educator  [place]  Instruction Review Code  2- Demonstrated Understanding      Stretching for Flexibility and Mobility:  -Group instruction provided by verbal instruction, patient participation, and written materials to support subject.  Instructors lead participants through series of stretches that are designed to increase flexibility thus improving mobility.  These stretches are additional exercise for major muscle groups that are typically performed during regular warm up and cool down.   Hands Only CPR:  -Group verbal, video, and participation provides a basic overview of AHA guidelines for community CPR. Role-play of emergencies allow participants the opportunity to practice calling for help and chest compression technique with discussion of AED use.   Hypertension: -Group verbal and written instruction that provides a basic overview of hypertension including the  most recent diagnostic guidelines, risk factor reduction with self-care instructions and medication management.   CARDIAC REHAB PHASE II EXERCISE from 04/25/2018 in [place]  Date  04/06/18  Educator  RN  Instruction Review Code  2- Demonstrated Understanding       Nutrition I class: Heart Healthy Eating:  -Group instruction provided by PowerPoint slides, verbal discussion, and written materials to support subject matter. The instructor gives an explanation and review of the Therapeutic Lifestyle Changes diet recommendations, which includes a discussion on lipid goals, dietary fat, sodium, fiber, plant stanol/sterol esters, sugar, and the components of a well-balanced, healthy diet.   Nutrition II class: Lifestyle Skills:  -Group instruction provided by PowerPoint slides, verbal discussion, and written materials to support subject matter. The instructor gives an explanation and review of label reading, grocery shopping for heart health, heart healthy recipe modifications, and ways to make healthier choices when eating out.   Diabetes Question & Answer:  -Group instruction provided by PowerPoint slides, verbal discussion, and written materials to support subject matter. The instructor gives an explanation and review of diabetes co-morbidities, pre- and post-prandial blood glucose goals, pre-exercise blood glucose goals, signs, symptoms, and treatment of hypoglycemia and hyperglycemia, and foot care basics.   Diabetes Blitz:  -Group instruction provided by PowerPoint slides, verbal discussion, and written materials to support subject matter. The instructor  gives an explanation and review of the physiology behind type 1 and type 2 diabetes, diabetes medications and rational behind using different medications, pre- and post-prandial blood glucose recommendations and Hemoglobin A1c goals, diabetes diet, and exercise including blood glucose guidelines for exercising  safely.    Portion Distortion:  -Group instruction provided by PowerPoint slides, verbal discussion, written materials, and food models to support subject matter. The instructor gives an explanation of serving size versus portion size, changes in portions sizes over the last 20 years, and what consists of a serving from each food group.   Stress Management:  -Group instruction provided by verbal instruction, video, and written materials to support subject matter.  Instructors review role of stress in heart disease and how to cope with stress positively.     CARDIAC REHAB PHASE II EXERCISE from 04/25/2018 in [place]  Date  03/30/18  Instruction Review Code  2- Demonstrated Understanding      Exercising on Your Own:  -Group instruction provided by verbal instruction, power point, and written materials to support subject.  Instructors discuss benefits of exercise, components of exercise, frequency and intensity of exercise, and end points for exercise.  Also discuss use of nitroglycerin and activating EMS.  Review options of places to exercise outside of rehab.  Review guidelines for sex with heart disease.   CARDIAC REHAB PHASE II EXERCISE from 04/25/2018 in [place]  Date  04/25/18  Educator  EP  Instruction Review Code  2- Demonstrated Understanding      Cardiac Drugs I:  -Group instruction provided by verbal instruction and written materials to support subject.  Instructor reviews cardiac drug classes: antiplatelets, anticoagulants, beta blockers, and statins.  Instructor discusses reasons, side effects, and lifestyle considerations for each drug class.   CARDIAC REHAB PHASE II EXERCISE from 04/25/2018 in [place]  Date  03/07/18  Instruction Review Code  2- Demonstrated Understanding      Cardiac Drugs II:  -Group instruction provided by verbal instruction and written materials to  support subject.  Instructor reviews cardiac drug classes: angiotensin converting enzyme inhibitors (ACE-I), angiotensin II receptor blockers (ARBs), nitrates, and calcium channel blockers.  Instructor discusses reasons, side effects, and lifestyle considerations for each drug class.   CARDIAC REHAB PHASE II EXERCISE from 04/25/2018 in [place]  Date  04/11/18  Instruction Review Code  2- Demonstrated Understanding      Anatomy and Physiology of the Circulatory System:  Group verbal and written instruction and models provide basic cardiac anatomy and physiology, with the coronary electrical and arterial systems. Review of: AMI, Angina, Valve disease, Heart Failure, Peripheral Artery Disease, Cardiac Arrhythmia, Pacemakers, and the ICD.   Other Education:  -Group or individual verbal, written, or video instructions that support the educational goals of the cardiac rehab program.   Holiday Eating Survival Tips:  -Group instruction provided by PowerPoint slides, verbal discussion, and written materials to support subject matter. The instructor gives patients tips, tricks, and techniques to help them not only survive but enjoy the holidays despite the onslaught of food that accompanies the holidays.   Knowledge Questionnaire Score: Knowledge Questionnaire Score - 02/22/18 2703      Knowledge Questionnaire Score   Pre Score  23/24       Core Components/Risk Factors/Patient Goals at Admission: Personal Goals and Risk Factors at Admission - 02/22/18 0954      Core Components/Risk Factors/Patient  Goals on Admission    Weight Management  Yes;Weight Maintenance    Intervention  Weight Management: Develop a combined nutrition and exercise program designed to reach desired caloric intake, while maintaining appropriate intake of nutrient and fiber, sodium and fats, and appropriate energy expenditure required for the weight goal.;Weight Management: Provide education  and appropriate resources to help participant work on and attain dietary goals.;Weight Management/Obesity: Establish reasonable short term and long term weight goals.    Admit Weight  177 lb 11.1 oz (80.6 kg)    Expected Outcomes  Short Term: Continue to assess and modify interventions until short term weight is achieved;Long Term: Adherence to nutrition and physical activity/exercise program aimed toward attainment of established weight goal;Weight Maintenance: Understanding of the daily nutrition guidelines, which includes 25-35% calories from fat, 7% or less cal from saturated fats, less than 200mg  cholesterol, less than 1.5gm of sodium, & 5 or more servings of fruits and vegetables daily;Understanding recommendations for meals to include 15-35% energy as protein, 25-35% energy from fat, 35-60% energy from carbohydrates, less than 200mg  of dietary cholesterol, 20-35 gm of total fiber daily;Understanding of distribution of calorie intake throughout the day with the consumption of 4-5 meals/snacks    Hypertension  Yes    Intervention  Provide education on lifestyle modifcations including regular physical activity/exercise, weight management, moderate sodium restriction and increased consumption of fresh fruit, vegetables, and low fat dairy, alcohol moderation, and smoking cessation.;Monitor prescription use compliance.    Expected Outcomes  Short Term: Continued assessment and intervention until BP is < 140/33mm HG in hypertensive participants. < 130/29mm HG in hypertensive participants with diabetes, heart failure or chronic kidney disease.;Long Term: Maintenance of blood pressure at goal levels.    Lipids  Yes    Intervention  Provide education and support for participant on nutrition & aerobic/resistive exercise along with prescribed medications to achieve LDL 70mg , HDL >40mg .    Expected Outcomes  Short Term: Participant states understanding of desired cholesterol values and is compliant with  medications prescribed. Participant is following exercise prescription and nutrition guidelines.;Long Term: Cholesterol controlled with medications as prescribed, with individualized exercise RX and with personalized nutrition plan. Value goals: LDL < 70mg , HDL > 40 mg.    Stress  Yes    Intervention  Offer individual and/or small group education and counseling on adjustment to heart disease, stress management and health-related lifestyle change. Teach and support self-help strategies.;Refer participants experiencing significant psychosocial distress to appropriate mental health specialists for further evaluation and treatment. When possible, include family members and significant others in education/counseling sessions.    Expected Outcomes  Short Term: Participant demonstrates changes in health-related behavior, relaxation and other stress management skills, ability to obtain effective social support, and compliance with psychotropic medications if prescribed.;Long Term: Emotional wellbeing is indicated by absence of clinically significant psychosocial distress or social isolation.       Core Components/Risk Factors/Patient Goals Review:  Goals and Risk Factor Review    Row Name 02/26/18 0920 04/04/18 1700 04/26/18 1704         Core Components/Risk Factors/Patient Goals Review   Personal Goals Review  Weight Management/Obesity;Hypertension;Lipids;Stress;Heart Failure  Weight Management/Obesity;Hypertension;Lipids;Stress;Heart Failure  Weight Management/Obesity;Hypertension;Lipids;Stress;Heart Failure     Review  pt with multiple CAD RF demonstrates eagerness to participate in CR program. pt eager to resume previous activity level and restore heart health.   Pt with multiple CAD RFs willing to participate in CR exercise. Elta Guadeloupe is tolerating increased workloads well.  He has switched class  times and has great attendance.  He is very pleased that his EF has increased and he does not have to wear the  LifeVest.   Pt with multiple CAD RFs willing to participate in CR exercise.  Elta Guadeloupe is enjoying the challenge of using the elliptical during rehab.  Elta Guadeloupe reported a low BP reading at home, but his BP's have been WNL at rehab. Will watch.      Expected Outcomes  pt will participate in CR exercise, nutrition and lifestyle modification to reduce overall RF.   Pt will continue to participate in CR exercise, nutrition, and lifestyle modification opportunities to reduce overall RF.   Pt will continue to participate in CR exercise, nutrition, and lifestyle modification opportunities to reduce overall RF.         Core Components/Risk Factors/Patient Goals at Discharge (Final Review):  Goals and Risk Factor Review - 04/26/18 1704      Core Components/Risk Factors/Patient Goals Review   Personal Goals Review  Weight Management/Obesity;Hypertension;Lipids;Stress;Heart Failure    Review  Pt with multiple CAD RFs willing to participate in CR exercise.  Elta Guadeloupe is enjoying the challenge of using the elliptical during rehab.  Elta Guadeloupe reported a low BP reading at home, but his BP's have been WNL at rehab. Will watch.     Expected Outcomes  Pt will continue to participate in CR exercise, nutrition, and lifestyle modification opportunities to reduce overall RF.        ITP Comments: ITP Comments    Row Name 02/22/18 5320 02/26/18 0915 03/07/18 1530 04/04/18 1658 04/26/18 1701   ITP Comments  Dr. Fransico Him, Medical Director  pt started group exercise today. pt tolerated light activity without difficulty. pt oriented to exercise equipment and safety routine. pt vervbalized understanding.   30 day ITP review.    30 Day ITP Review. Elta Guadeloupe is tolerating increased workloads well.  He has switched class times and has great attendance.  He is very pleased that his EF has increased and he does not have to wear the LifeVest.   30 Day ITP Review.  Elta Guadeloupe continues to tolerate increased workloads.  He is enjoying the challenge of using  the elliptical during rehab.  Elta Guadeloupe reported a low BP reading at home, but his BP's have been WNL at rehab. Will watch.       Comments: See ITP Comments.

## 2018-05-04 ENCOUNTER — Ambulatory Visit (HOSPITAL_COMMUNITY): Payer: BLUE CROSS/BLUE SHIELD

## 2018-05-04 ENCOUNTER — Encounter (HOSPITAL_COMMUNITY)
Admission: RE | Admit: 2018-05-04 | Discharge: 2018-05-04 | Disposition: A | Discharge status: Home / Self Care | Payer: BLUE CROSS/BLUE SHIELD | Source: Ambulatory Visit | Attending: Cardiology | Admitting: Cardiology

## 2018-05-04 ENCOUNTER — Encounter (HOSPITAL_COMMUNITY): Payer: BLUE CROSS/BLUE SHIELD

## 2018-05-04 DIAGNOSIS — I2510 Atherosclerotic heart disease of native coronary artery without angina pectoris: Secondary | ICD-10-CM | POA: Diagnosis not present

## 2018-05-04 DIAGNOSIS — Z79899 Other long term (current) drug therapy: Secondary | ICD-10-CM | POA: Diagnosis not present

## 2018-05-04 DIAGNOSIS — E785 Hyperlipidemia, unspecified: Secondary | ICD-10-CM | POA: Diagnosis not present

## 2018-05-04 DIAGNOSIS — Z955 Presence of coronary angioplasty implant and graft: Secondary | ICD-10-CM

## 2018-05-04 DIAGNOSIS — I2102 ST elevation (STEMI) myocardial infarction involving left anterior descending coronary artery: Secondary | ICD-10-CM | POA: Diagnosis not present

## 2018-05-04 DIAGNOSIS — Z79890 Hormone replacement therapy (postmenopausal): Secondary | ICD-10-CM | POA: Diagnosis not present

## 2018-05-04 DIAGNOSIS — Z7982 Long term (current) use of aspirin: Secondary | ICD-10-CM | POA: Diagnosis not present

## 2018-05-04 DIAGNOSIS — I10 Essential (primary) hypertension: Secondary | ICD-10-CM | POA: Diagnosis not present

## 2018-05-07 ENCOUNTER — Encounter (HOSPITAL_COMMUNITY): Payer: BLUE CROSS/BLUE SHIELD

## 2018-05-07 ENCOUNTER — Encounter (HOSPITAL_COMMUNITY)
Admission: RE | Admit: 2018-05-07 | Discharge: 2018-05-07 | Disposition: A | Discharge status: Home / Self Care | Payer: BLUE CROSS/BLUE SHIELD | Source: Ambulatory Visit | Attending: Cardiology | Admitting: Cardiology

## 2018-05-07 ENCOUNTER — Ambulatory Visit (HOSPITAL_COMMUNITY): Payer: BLUE CROSS/BLUE SHIELD

## 2018-05-07 DIAGNOSIS — Z955 Presence of coronary angioplasty implant and graft: Secondary | ICD-10-CM

## 2018-05-07 DIAGNOSIS — I2102 ST elevation (STEMI) myocardial infarction involving left anterior descending coronary artery: Secondary | ICD-10-CM

## 2018-05-07 DIAGNOSIS — Z79890 Hormone replacement therapy (postmenopausal): Secondary | ICD-10-CM | POA: Diagnosis not present

## 2018-05-07 DIAGNOSIS — Z7982 Long term (current) use of aspirin: Secondary | ICD-10-CM | POA: Diagnosis not present

## 2018-05-07 DIAGNOSIS — Z79899 Other long term (current) drug therapy: Secondary | ICD-10-CM | POA: Diagnosis not present

## 2018-05-07 DIAGNOSIS — I2510 Atherosclerotic heart disease of native coronary artery without angina pectoris: Secondary | ICD-10-CM | POA: Diagnosis not present

## 2018-05-07 DIAGNOSIS — E785 Hyperlipidemia, unspecified: Secondary | ICD-10-CM | POA: Diagnosis not present

## 2018-05-07 DIAGNOSIS — I10 Essential (primary) hypertension: Secondary | ICD-10-CM | POA: Diagnosis not present

## 2018-05-09 ENCOUNTER — Ambulatory Visit (HOSPITAL_COMMUNITY): Payer: BLUE CROSS/BLUE SHIELD

## 2018-05-09 ENCOUNTER — Encounter (HOSPITAL_COMMUNITY): Payer: BLUE CROSS/BLUE SHIELD

## 2018-05-09 ENCOUNTER — Other Ambulatory Visit: Payer: Self-pay

## 2018-05-09 ENCOUNTER — Encounter (HOSPITAL_COMMUNITY)
Admission: RE | Admit: 2018-05-09 | Discharge: 2018-05-09 | Disposition: A | Discharge status: Home / Self Care | Payer: BLUE CROSS/BLUE SHIELD | Source: Ambulatory Visit | Attending: Cardiology | Admitting: Cardiology

## 2018-05-09 VITALS — Ht 68.5 in | Wt 177.7 lb

## 2018-05-09 DIAGNOSIS — E785 Hyperlipidemia, unspecified: Secondary | ICD-10-CM | POA: Diagnosis not present

## 2018-05-09 DIAGNOSIS — Z79899 Other long term (current) drug therapy: Secondary | ICD-10-CM | POA: Diagnosis not present

## 2018-05-09 DIAGNOSIS — I2510 Atherosclerotic heart disease of native coronary artery without angina pectoris: Secondary | ICD-10-CM | POA: Diagnosis not present

## 2018-05-09 DIAGNOSIS — Z955 Presence of coronary angioplasty implant and graft: Secondary | ICD-10-CM

## 2018-05-09 DIAGNOSIS — Z79890 Hormone replacement therapy (postmenopausal): Secondary | ICD-10-CM | POA: Diagnosis not present

## 2018-05-09 DIAGNOSIS — Z7982 Long term (current) use of aspirin: Secondary | ICD-10-CM | POA: Diagnosis not present

## 2018-05-09 DIAGNOSIS — I10 Essential (primary) hypertension: Secondary | ICD-10-CM | POA: Diagnosis not present

## 2018-05-09 DIAGNOSIS — I2102 ST elevation (STEMI) myocardial infarction involving left anterior descending coronary artery: Secondary | ICD-10-CM | POA: Diagnosis not present

## 2018-05-10 DIAGNOSIS — G4733 Obstructive sleep apnea (adult) (pediatric): Secondary | ICD-10-CM | POA: Diagnosis not present

## 2018-05-11 ENCOUNTER — Other Ambulatory Visit: Payer: Self-pay

## 2018-05-11 ENCOUNTER — Encounter (HOSPITAL_COMMUNITY)
Admission: RE | Admit: 2018-05-11 | Discharge: 2018-05-11 | Disposition: A | Discharge status: Home / Self Care | Payer: BLUE CROSS/BLUE SHIELD | Source: Ambulatory Visit | Attending: Cardiology | Admitting: Cardiology

## 2018-05-11 ENCOUNTER — Encounter (HOSPITAL_COMMUNITY): Payer: BLUE CROSS/BLUE SHIELD

## 2018-05-11 ENCOUNTER — Ambulatory Visit (HOSPITAL_COMMUNITY): Payer: BLUE CROSS/BLUE SHIELD

## 2018-05-11 DIAGNOSIS — Z955 Presence of coronary angioplasty implant and graft: Secondary | ICD-10-CM

## 2018-05-11 DIAGNOSIS — Z7982 Long term (current) use of aspirin: Secondary | ICD-10-CM | POA: Diagnosis not present

## 2018-05-11 DIAGNOSIS — Z79899 Other long term (current) drug therapy: Secondary | ICD-10-CM | POA: Diagnosis not present

## 2018-05-11 DIAGNOSIS — I10 Essential (primary) hypertension: Secondary | ICD-10-CM | POA: Diagnosis not present

## 2018-05-11 DIAGNOSIS — Z79890 Hormone replacement therapy (postmenopausal): Secondary | ICD-10-CM | POA: Diagnosis not present

## 2018-05-11 DIAGNOSIS — E785 Hyperlipidemia, unspecified: Secondary | ICD-10-CM | POA: Diagnosis not present

## 2018-05-11 DIAGNOSIS — I2102 ST elevation (STEMI) myocardial infarction involving left anterior descending coronary artery: Secondary | ICD-10-CM | POA: Diagnosis not present

## 2018-05-11 DIAGNOSIS — I2510 Atherosclerotic heart disease of native coronary artery without angina pectoris: Secondary | ICD-10-CM | POA: Diagnosis not present

## 2018-05-14 ENCOUNTER — Ambulatory Visit (HOSPITAL_COMMUNITY): Payer: BLUE CROSS/BLUE SHIELD

## 2018-05-14 ENCOUNTER — Encounter (HOSPITAL_COMMUNITY): Payer: BLUE CROSS/BLUE SHIELD

## 2018-05-14 ENCOUNTER — Other Ambulatory Visit: Payer: Self-pay

## 2018-05-14 ENCOUNTER — Encounter (HOSPITAL_COMMUNITY)
Admission: RE | Admit: 2018-05-14 | Discharge: 2018-05-14 | Disposition: A | Discharge status: Home / Self Care | Payer: BLUE CROSS/BLUE SHIELD | Source: Ambulatory Visit | Attending: Cardiology | Admitting: Cardiology

## 2018-05-14 DIAGNOSIS — Z79890 Hormone replacement therapy (postmenopausal): Secondary | ICD-10-CM | POA: Diagnosis not present

## 2018-05-14 DIAGNOSIS — I10 Essential (primary) hypertension: Secondary | ICD-10-CM | POA: Diagnosis not present

## 2018-05-14 DIAGNOSIS — Z79899 Other long term (current) drug therapy: Secondary | ICD-10-CM | POA: Diagnosis not present

## 2018-05-14 DIAGNOSIS — Z955 Presence of coronary angioplasty implant and graft: Secondary | ICD-10-CM | POA: Diagnosis not present

## 2018-05-14 DIAGNOSIS — E785 Hyperlipidemia, unspecified: Secondary | ICD-10-CM | POA: Diagnosis not present

## 2018-05-14 DIAGNOSIS — Z7982 Long term (current) use of aspirin: Secondary | ICD-10-CM | POA: Diagnosis not present

## 2018-05-14 DIAGNOSIS — I2102 ST elevation (STEMI) myocardial infarction involving left anterior descending coronary artery: Secondary | ICD-10-CM | POA: Diagnosis not present

## 2018-05-14 DIAGNOSIS — I2510 Atherosclerotic heart disease of native coronary artery without angina pectoris: Secondary | ICD-10-CM | POA: Diagnosis not present

## 2018-05-16 ENCOUNTER — Encounter (HOSPITAL_COMMUNITY): Payer: BLUE CROSS/BLUE SHIELD

## 2018-05-16 ENCOUNTER — Ambulatory Visit (HOSPITAL_COMMUNITY): Payer: BLUE CROSS/BLUE SHIELD

## 2018-05-18 ENCOUNTER — Encounter (HOSPITAL_COMMUNITY): Payer: BLUE CROSS/BLUE SHIELD

## 2018-05-18 ENCOUNTER — Other Ambulatory Visit: Payer: Self-pay

## 2018-05-18 ENCOUNTER — Ambulatory Visit (HOSPITAL_COMMUNITY): Payer: BLUE CROSS/BLUE SHIELD

## 2018-05-18 DIAGNOSIS — I10 Essential (primary) hypertension: Secondary | ICD-10-CM

## 2018-05-18 MED ORDER — TICAGRELOR 90 MG PO TABS: 90.0000 mg | ORAL_TABLET | Freq: Two times a day (BID) | ORAL | 3 refills | Status: DC

## 2018-05-18 NOTE — Progress Notes (Signed)
Discharge Progress Report  Patient Details  Name: [place] Terrell MRN: [id number] Date of Birth: 03-30-1955 Referring Provider:     [facility] from 02/22/2018 in [facility]  Referring Provider  Dr. Virgina Jock       Number of Visits: 32  Reason for Discharge:  Patient reached a stable level of exercise. Patient independent in their exercise. Patient has met program and personal goals.  Smoking History:  Social History   Tobacco Use  Smoking Status Never Smoker  Smokeless Tobacco Never Used    Diagnosis:  STEMI involving left anterior descending coronary artery ([place]) 12/04/17  Status post coronary artery stent placement, S/P DES LAD 12/04/17  ADL [facility]:   Initial Exercise Prescription: Initial Exercise Prescription - 02/22/18 1000      Date of Initial Exercise RX and Referring Provider   Date  02/22/18    Referring Provider  Dr. Virgina Jock    Expected Discharge Date  06/01/18      Treadmill   MPH  3    Grade  2    Minutes  10      Recumbant Bike   Level  1.5    Watts  70    Minutes  10    METs  4.75      NuStep   Level  4    SPM  85    Minutes  10    METs  4.6      Prescription Details   Frequency (times per week)  3    Duration  Progress to 30 minutes of continuous aerobic without signs/symptoms of physical distress      Intensity   THRR 40-80% of Max Heartrate  63-126    Ratings of Perceived Exertion  11-13      Progression   Progression  Continue to progress workloads to maintain intensity without signs/symptoms of physical distress.      Resistance Training   Training Prescription  Yes    Weight  4 lbs.    Reps  10-15       Discharge Exercise Prescription (Final Exercise Prescription Changes): Exercise Prescription Changes - 05/14/18 1559      Response to Exercise   Blood Pressure (Admit)  120/76    Blood Pressure (Exercise)  156/72    Blood Pressure (Exit)  120/80    Heart  Rate (Admit)  88 bpm    Heart Rate (Exercise)  142 bpm    Heart Rate (Exit)  88 bpm    Rating of Perceived Exertion (Exercise)  15    Perceived Dyspnea (Exercise)  0    Symptoms  None     Comments  Pt graduated from Cardiac Rehab     Duration  Progress to 45 minutes of aerobic exercise without signs/symptoms of physical distress    Intensity  THRR unchanged      Progression   Progression  Continue to progress workloads to maintain intensity without signs/symptoms of physical distress.    Average METs  8.1      Resistance Training   Training Prescription  Yes    Weight  6lbs    Reps  10-15    Time  10 Minutes      Recumbant Bike   Level  --    Watts  --    Minutes  --    METs  --      NuStep   Level  5    SPM  105    Minutes  10    METs  7      Elliptical   Level  2    Speed  2    Minutes  10    METs  8.4      Rower   Level  5    Watts  91    Minutes  10    METs  8.5      Home Exercise Plan   Plans to continue exercise at  Home (comment)   Walking   Frequency  Add 4 additional days to program exercise sessions.    Initial Home Exercises Provided  03/21/18       Functional Capacity: 6 Minute Walk    Row Name 02/22/18 0950 05/09/18 1424 05/16/18 1537     6 Minute Walk   Phase  Initial  Initial  Discharge   Distance  1952 feet  2276 feet  -   Distance % Change  -  16.44 %  -   Distance Feet Change  -  324 ft  -   Walk Time  6 minutes  6 minutes  -   # of Rest Breaks  0  0  -   MPH  3.7  4.31  -   METS  4.8  4.72  -   RPE  12  10  -   Perceived Dyspnea   -  0  -   VO2 Peak  16.85  20.02  -   Symptoms  No  No  -   Resting HR  89 bpm  82 bpm  -   Resting BP  118/72  128/82  -   Resting Oxygen Saturation   100 %  -  -   Exercise Oxygen Saturation  during 6 min walk  99 %  -  -   Max Ex. HR  124 bpm  139 bpm  -   Max Ex. BP  140/80  158/86  -   2 Minute Post BP  124/72  108/64  -      Psychological, QOL, Others - Outcomes: PHQ 2/9: Depression  screen Michael Terrell 2/9 05/14/2018 02/26/2018  Decreased Interest 0 1  Down, Depressed, Hopeless 0 2  PHQ - 2 Score 0 3  Altered sleeping - 2  Tired, decreased energy - 2  Change in appetite - 0  Feeling bad or failure about yourself  - 1  Trouble concentrating - 0  Moving slowly or fidgety/restless - 1  Suicidal thoughts - 0  PHQ-9 Score - 9  Difficult doing work/chores - Somewhat difficult    Quality of Life: Quality of Life - 05/16/18 1426      Quality of Life   Select  Quality of Life      Quality of Life Scores   Health/Function Pre  19.6 %    Health/Function Post  23.07 %    Health/Function % Change  17.7 %    Socioeconomic Pre  23.75 %    Socioeconomic Post  26.43 %    Socioeconomic % Change   11.28 %    Psych/Spiritual Pre  21.21 %    Psych/Spiritual Post  23.79 %    Psych/Spiritual % Change  12.16 %    Family Pre  27.6 %    Family Post  29.5 %    Family % Change  6.88 %    GLOBAL Pre  21.91 %    GLOBAL Post  24.85 %  GLOBAL % Change  13.42 %       Personal Goals: Goals established at orientation with interventions provided to work toward goal. Personal Goals and Risk Factors at Admission - 02/22/18 0954      Core Components/Risk Factors/Patient Goals on Admission    Weight Management  Yes;Weight Maintenance    Intervention  Weight Management: Develop a combined nutrition and exercise program designed to reach desired caloric intake, while maintaining appropriate intake of nutrient and fiber, sodium and fats, and appropriate energy expenditure required for the weight goal.;Weight Management: Provide education and appropriate resources to help participant work on and attain dietary goals.;Weight Management/Obesity: Establish reasonable short term and long term weight goals.    Admit Weight  177 lb 11.1 oz (80.6 kg)    Expected Outcomes  Short Term: Continue to assess and modify interventions until short term weight is achieved;Long Term: Adherence to nutrition and  physical activity/exercise program aimed toward attainment of established weight goal;Weight Maintenance: Understanding of the daily nutrition guidelines, which includes 25-35% calories from fat, 7% or less cal from saturated fats, less than 252m cholesterol, less than 1.5gm of sodium, & 5 or more servings of fruits and vegetables daily;Understanding recommendations for meals to include 15-35% energy as protein, 25-35% energy from fat, 35-60% energy from carbohydrates, less than 2013mof dietary cholesterol, 20-35 gm of total fiber daily;Understanding of distribution of calorie intake throughout the day with the consumption of 4-5 meals/snacks    Hypertension  Yes    Intervention  Provide education on lifestyle modifcations including regular physical activity/exercise, weight management, moderate sodium restriction and increased consumption of fresh fruit, vegetables, and low fat dairy, alcohol moderation, and smoking cessation.;Monitor prescription use compliance.    Expected Outcomes  Short Term: Continued assessment and intervention until BP is < 140/9057mG in hypertensive participants. < 130/30m75m in hypertensive participants with diabetes, heart failure or chronic kidney disease.;Long Term: Maintenance of blood pressure at goal levels.    Lipids  Yes    Intervention  Provide education and support for participant on nutrition & aerobic/resistive exercise along with prescribed medications to achieve LDL <70mg9mL >40mg.20mExpected Outcomes  Short Term: Participant states understanding of desired cholesterol values and is compliant with medications prescribed. Participant is following exercise prescription and nutrition guidelines.;Long Term: Cholesterol controlled with medications as prescribed, with individualized exercise RX and with personalized nutrition plan. Value goals: LDL < 70mg, 65m> 40 mg.    Stress  Yes    Intervention  Offer individual and/or small group education and counseling on  adjustment to heart disease, stress management and health-related lifestyle change. Teach and support self-help strategies.;Refer participants experiencing significant psychosocial distress to appropriate mental health specialists for further evaluation and treatment. When possible, include family members and significant others in education/counseling sessions.    Expected Outcomes  Short Term: Participant demonstrates changes in health-related behavior, relaxation and other stress management skills, ability to obtain effective social support, and compliance with psychotropic medications if prescribed.;Long Term: Emotional wellbeing is indicated by absence of clinically significant psychosocial distress or social isolation.        Personal Goals Discharge: Goals and Risk Factor Review    Row Name 02/26/18 0920 04/04/18 1700 04/26/18 1704 05/14/18 1459       Core Components/Risk Factors/Patient Goals Review   Personal Goals Review  Weight Management/Obesity;Hypertension;Lipids;Stress;Heart Failure  Weight Management/Obesity;Hypertension;Lipids;Stress;Heart Failure  Weight Management/Obesity;Hypertension;Lipids;Stress;Heart Failure  Weight Management/Obesity;Hypertension;Lipids;Stress;Heart Failure    Review  pt with multiple  CAD RF demonstrates eagerness to participate in CR program. pt eager to resume previous activity level and restore heart health.   Pt with multiple CAD RFs willing to participate in CR exercise. Michael Terrell is tolerating increased workloads well.  He has switched class times and has great attendance.  He is very pleased that his EF has increased and he does not have to wear the LifeVest.   Pt with multiple CAD RFs willing to participate in CR exercise.  Michael Terrell is enjoying the challenge of using the elliptical during rehab.  Michael Terrell reported a low BP reading at home, but his BP's have been WNL at rehab. Will watch.   Pt graduated from [facility] with 32 completed sessions.  Pt felt that he increased his  education and established an exercise routine.     Expected Outcomes  pt will participate in CR exercise, nutrition and lifestyle modification to reduce overall RF.   Pt will continue to participate in CR exercise, nutrition, and lifestyle modification opportunities to reduce overall RF.   Pt will continue to participate in CR exercise, nutrition, and lifestyle modification opportunities to reduce overall RF.   Pt will continue to participate in exercise, nutrition, and lifestyle modification opportunities to reduce overall RF.        Exercise Goals and Review: Exercise Goals    Row Name 02/22/18 [phone]             Exercise Goals   Increase Physical Activity  Yes       Intervention  Provide advice, education, support and counseling about physical activity/exercise needs.;Develop an individualized exercise prescription for aerobic and resistive training based on initial evaluation findings, risk stratification, comorbidities and participant's personal goals.       Expected Outcomes  Short Term: Attend rehab on a regular basis to increase amount of physical activity.       Increase Strength and Stamina  Yes       Intervention  Provide advice, education, support and counseling about physical activity/exercise needs.;Develop an individualized exercise prescription for aerobic and resistive training based on initial evaluation findings, risk stratification, comorbidities and participant's personal goals.       Expected Outcomes  Short Term: Increase workloads from initial exercise prescription for resistance, speed, and METs.       Able to understand and use rate of perceived exertion (RPE) scale  Yes       Intervention  Provide education and explanation on how to use RPE scale       Expected Outcomes  Short Term: Able to use RPE daily in rehab to express subjective intensity level;Long Term:  Able to use RPE to guide intensity level when exercising independently       Knowledge and understanding of  Target Heart Rate Range (THRR)  Yes       Intervention  Provide education and explanation of THRR including how the numbers were predicted and where they are located for reference       Expected Outcomes  Short Term: Able to state/look up THRR;Long Term: Able to use THRR to govern intensity when exercising independently;Short Term: Able to use daily as guideline for intensity in rehab       Able to check pulse independently  Yes       Intervention  Provide education and demonstration on how to check pulse in carotid and radial arteries.;Review the importance of being able to check your own pulse for safety during independent exercise  Expected Outcomes  Short Term: Able to explain why pulse checking is important during independent exercise;Long Term: Able to check pulse independently and accurately       Understanding of Exercise Prescription  Yes       Intervention  Provide education, explanation, and written materials on patient's individual exercise prescription       Expected Outcomes  Short Term: Able to explain program exercise prescription;Long Term: Able to explain home exercise prescription to exercise independently          Exercise Goals Re-Evaluation: Exercise Goals Re-Evaluation    Row Name 02/26/18 1237 03/21/18 1351 04/30/18 1029 05/16/18 1612       Exercise Goal Re-Evaluation   Exercise Goals Review  Increase Physical Activity;Increase Strength and Stamina;Able to check pulse independently;Knowledge and understanding of Target Heart Rate Range (THRR);Able to understand and use rate of perceived exertion (RPE) scale;Understanding of Exercise Prescription  Increase Physical Activity;Able to understand and use rate of perceived exertion (RPE) scale;Knowledge and understanding of Target Heart Rate Range (THRR);Understanding of Exercise Prescription;Increase Strength and Stamina;Able to check pulse independently  Increase Physical Activity;Understanding of Exercise Prescription   Increase Physical Activity;Understanding of Exercise Prescription    Comments  Pt first day of exercise. Tolerated exercise well. Will progress as tolerated.   Reviewed HEP with pt. Also reviewed THRR, RPE Scale, weather conditions, endpoints of exercise, NTG use, when to call 911, warmup and cool down.   Has added elliptical to pt's exercise prescription. Pt is repsonding well to the new challenge of machine. Will add rower to pt's exercise prescription this week. Will continue to monitor pt.   Pt has completed 33 sessions of rehab. Pt has increased functional capacity by 16.44%. Pt increased post 6MWT distance by 340f. One of pt's main goals was to increase heart strength. Pt was able to be get rid of LifeVest throughout program. Pt has made great progress while in program.     Expected Outcomes  Will monitor and progress Pt as tolerated.   Pt will continue to walk for exercise 6-7 days a week for 30-45 minutes. Pt will continue to improve cardiovascular strength. Will continue to monitor.  Pt will continue to exercise most days of the week for 30-45 minutes. Pt will continue to increase strength and stamina.  Pt will continue to exercise daily. Pt will walk 30-45 minutes. Pt will also consider joining local gym. Pt will continue to maintain cardiovascular endurance.        Nutrition & Weight - Outcomes: Pre Biometrics - 02/22/18 0952      Pre Biometrics   Height  5' 8.5" (1.74 m)    Weight  80.6 kg    Waist Circumference  39 inches    Hip Circumference  39.5 inches    Waist to Hip Ratio  0.99 %    BMI (Calculated)  26.62    Triceps Skinfold  22 mm    % Body Fat  28.1 %    Grip Strength  40 kg    Flexibility  7 in    Single Leg Stand  30 seconds      Post Biometrics - 05/09/18 1425       Post  Biometrics   Height  5' 8.5" (1.74 m)    Weight  80.6 kg    Waist Circumference  39 inches    Hip Circumference  37.5 inches    Waist to Hip Ratio  1.04 %    BMI (Calculated)  26.62  Triceps Skinfold  10 mm    % Body Fat  24.8 %    Grip Strength  37 kg    Flexibility  9 in    Single Leg Stand  35 seconds       Nutrition: Nutrition Therapy & Goals - 05/17/18 1223      Nutrition Therapy   Diet  heart healthy      Personal Nutrition Goals   Nutrition Goal  Pt to identify and limit food sources of saturated fat, trans fat, refined carbohydrates and sodium    Personal Goal #2  Pt to identify food quantities necessary to achieve weight loss of 6-24 lbs. at graduation from cardiac rehab.   weight loss goal not met   Personal Goal #3  Pt to eat more and a variety of non-starchy vegetables   nutrition goal met   Personal Goal #4  Pt to go easy on high fat cheeses.   nutrition goal met     Intervention Plan   Intervention  Prescribe, educate and counsel regarding individualized specific dietary modifications aiming towards targeted core components such as weight, hypertension, lipid management, diabetes, heart failure and other comorbidities.    Expected Outcomes  Short Term Goal: Understand basic principles of dietary content, such as calories, fat, sodium, cholesterol and nutrients.;Long Term Goal: Adherence to prescribed nutrition plan.       Nutrition Discharge: Nutrition Assessments - 05/17/18 1221      MEDFICTS Scores   Pre Score  18    Post Score  9    Score Difference  -9       Education Questionnaire Score: Knowledge Questionnaire Score - 05/16/18 1427      Knowledge Questionnaire Score   Pre Score  23/24    Post Score  24/24       Goals reviewed with patient; copy given to patient.

## 2018-05-21 ENCOUNTER — Encounter (HOSPITAL_COMMUNITY): Payer: BLUE CROSS/BLUE SHIELD

## 2018-05-21 ENCOUNTER — Ambulatory Visit (HOSPITAL_COMMUNITY): Payer: BLUE CROSS/BLUE SHIELD

## 2018-05-21 DIAGNOSIS — I429 Cardiomyopathy, unspecified: Secondary | ICD-10-CM | POA: Diagnosis not present

## 2018-05-21 DIAGNOSIS — E039 Hypothyroidism, unspecified: Secondary | ICD-10-CM | POA: Diagnosis not present

## 2018-05-21 DIAGNOSIS — I519 Heart disease, unspecified: Secondary | ICD-10-CM | POA: Diagnosis not present

## 2018-05-21 DIAGNOSIS — E782 Mixed hyperlipidemia: Secondary | ICD-10-CM | POA: Diagnosis not present

## 2018-05-23 ENCOUNTER — Encounter (HOSPITAL_COMMUNITY): Payer: BLUE CROSS/BLUE SHIELD

## 2018-05-23 ENCOUNTER — Ambulatory Visit (HOSPITAL_COMMUNITY): Payer: BLUE CROSS/BLUE SHIELD

## 2018-05-25 ENCOUNTER — Encounter (HOSPITAL_COMMUNITY): Payer: BLUE CROSS/BLUE SHIELD

## 2018-05-25 ENCOUNTER — Ambulatory Visit (HOSPITAL_COMMUNITY): Payer: BLUE CROSS/BLUE SHIELD

## 2018-05-28 ENCOUNTER — Ambulatory Visit (HOSPITAL_COMMUNITY): Payer: BLUE CROSS/BLUE SHIELD

## 2018-05-28 ENCOUNTER — Encounter (HOSPITAL_COMMUNITY): Payer: BLUE CROSS/BLUE SHIELD

## 2018-05-30 ENCOUNTER — Ambulatory Visit (HOSPITAL_COMMUNITY): Payer: BLUE CROSS/BLUE SHIELD

## 2018-05-30 ENCOUNTER — Encounter (HOSPITAL_COMMUNITY): Payer: BLUE CROSS/BLUE SHIELD

## 2018-06-10 DIAGNOSIS — G4733 Obstructive sleep apnea (adult) (pediatric): Secondary | ICD-10-CM | POA: Diagnosis not present

## 2018-06-20 ENCOUNTER — Other Ambulatory Visit: Payer: Self-pay

## 2018-06-20 ENCOUNTER — Ambulatory Visit: Payer: BC Managed Care – PPO | Admitting: Cardiology

## 2018-06-20 ENCOUNTER — Ambulatory Visit: Payer: Self-pay | Admitting: Cardiology

## 2018-06-20 VITALS — BP 96/55 | HR 51 | Ht 68.5 in | Wt 168.0 lb

## 2018-06-20 DIAGNOSIS — I255 Ischemic cardiomyopathy: Secondary | ICD-10-CM | POA: Diagnosis not present

## 2018-06-20 DIAGNOSIS — I2510 Atherosclerotic heart disease of native coronary artery without angina pectoris: Secondary | ICD-10-CM

## 2018-06-20 MED ORDER — LOSARTAN POTASSIUM 50 MG PO TABS: 50.0000 mg | ORAL_TABLET | Freq: Every day | ORAL | 2 refills | Status: DC

## 2018-06-20 NOTE — Progress Notes (Signed)
Virtual Visit via Video Note   Subjective:   Michael Terrell, male    DOB: Feb 18, 1956, 63 y.o.   MRN: [id number]   I connected with the patient on 06/20/18 by a video enabled telemedicine application and verified that I am speaking with the correct person using two identifiers.     I discussed the limitations of evaluation and management by telemedicine and the availability of in person appointments. The patient expressed understanding and agreed to proceed.   This visit type was conducted due to national recommendations for restrictions regarding the COVID-19 Pandemic (e.g. social distancing).  This format is felt to be most appropriate for this patient at this time.  All issues noted in this document were discussed and addressed.  No physical exam was performed (except for noted visual exam findings with Tele health visits).  The patient has consented to conduct a Tele health visit and understands insurance will be billed.     Chief complaint:  Coronary artery disease   HPI  63 y/o Caucasian male with CAD s/p prior LAD BMS 2011, anterior STEMI on 12/04/2017, s/p PPCI to ostial LAD, ischemic cardiomyopathy, hypertension, hyperlipidemia, here for 3 month follow up.  Patient has been doing well. His cardiac rehab was cut short due to the [place] pandemic. However, at his last session in 04/2018, he was able to reach 8+ mets without any denies chest pain, shortness of breath. He also denies palpitations, leg edema, orthopnea, PND, TIA/syncope.  He is compliant with his medical therapy. He is using CPAP regularly, but continues to have difficulty sleeping. his melatonin was recently increased to 10 mg by his PCP.     Past Medical History:  Diagnosis Date  . Acute anterior wall MI ([place]) 10/2009  . CAD (coronary artery disease)    BMS to LAD (2011)  . Chest pain   . Dyslipidemia   . Heart attack ([place]) 11/2017  . Hyperlipidemia   . Hypertension   . Sinus pause   . Snoring       Past Surgical History:  Procedure Laterality Date  . CARDIAC CATHETERIZATION  01/29/2010   patent LAD stent (3.5x73m Vision - Dr. TCorky Downs9/07/2009); 50% prox PLA stenosis, 50% distal RCA stenosis (Dr. HNorlene Duel  . CORONARY ANGIOPLASTY WITH STENT PLACEMENT  11/13/2009   3.5x249mVision BMS to LAD (Dr. T.Corky Downs . CORONARY/GRAFT ACUTE MI REVASCULARIZATION N/A 12/04/2017   Procedure: Coronary/Graft Acute MI Revascularization;  Surgeon: KeTroy SineMD;  [facility];  Service: Cardiovascular;  Laterality: N/A;  . KNEE SURGERY    . LEFT HEART CATH AND CORONARY ANGIOGRAPHY N/A 12/04/2017   Procedure: LEFT HEART CATH AND CORONARY ANGIOGRAPHY;  Surgeon: KeTroy SineMD;  [facility];  Service: Cardiovascular;  Laterality: N/A;  . NM MYOCAR PERF WALL MOTION  02/2010   bruce myoview - normal pattern of perfusion, low risk, no ischemia demonstrated, low risk  . SHOULDER SURGERY    . TRANSTHORACIC ECHOCARDIOGRAM  02/2012   EF 5540-98%grade 1 diastolic dysfunction; mild MR; LA mildly dilated; upper limit borderline LA enlargement     Social History   Socioeconomic History  . Marital status: Married    Spouse name: Not on file  . Number of children: 2  . Years of education: Not on file  . Highest education level: Master's degree (e.g., MA, MS, MEng, MEd, MSW, MBA)  Occupational History  . Occupation: FuNurse, mental healthor [employer]  Employer: [employer]  Social Needs  . Financial resource strain: Not very hard  . Food insecurity:    Worry: Never true    Inability: Never true  . Transportation needs:    Medical: No    Non-medical: No  Tobacco Use  . Smoking status: Never Smoker  . Smokeless tobacco: Never Used  Substance and Sexual Activity  . Alcohol use: No    Alcohol/week: 0.0 standard drinks  . Drug use: No  . Sexual activity: Not on file  Lifestyle  . Physical activity:    Days per week: 4 days    Minutes per  session: 30 min  . Stress: To some extent  Relationships  . Social connections:    Talks on phone: Not on file    Gets together: Not on file    Attends religious service: Not on file    Active member of club or organization: Not on file    Attends meetings of clubs or organizations: Not on file    Relationship status: Not on file  . Intimate partner violence:    Fear of current or ex partner: Not on file    Emotionally abused: Not on file    Physically abused: Not on file    Forced sexual activity: Not on file  Other Topics Concern  . Not on file  Social History Narrative  . Not on file     Family History  Problem Relation Age of Onset  . CAD Father        MI in his 83s     Current Outpatient Medications on File Prior to Visit  Medication Sig Dispense Refill  . aspirin 81 MG tablet Take 81 mg by mouth daily.     . cetirizine (ZYRTEC) 10 MG tablet Take 10 mg by mouth daily as needed for allergies.     . Cholecalciferol (VITAMIN D-3 PO) Take 2,500 Units by mouth daily.    . Evolocumab (REPATHA SURECLICK) 161 MG/ML SOAJ Inject 140 mg into the skin every 14 (fourteen) days.    . isosorbide-hydrALAZINE (BIDIL) 20-37.5 MG tablet Take 1 tablet by mouth 2 (two) times daily.    Marland Kitchen levothyroxine (SYNTHROID, LEVOTHROID) 75 MCG tablet Take 75 mcg by mouth daily before breakfast.    . losartan (COZAAR) 100 MG tablet Take 100 mg by mouth daily.    . montelukast (SINGULAIR) 10 MG tablet Take 10 mg by mouth at bedtime as needed.     . nitroGLYCERIN (NITROSTAT) 0.4 MG SL tablet Place 1 tablet (0.4 mg total) under the tongue every 5 (five) minutes as needed for chest pain. 25 tablet 3  . pantoprazole (PROTONIX) 20 MG tablet Take 1 tablet (20 mg total) by mouth daily. 30 tablet 6  . Polyvinyl Alcohol-Povidone (REFRESH OP) Place 1 drop into both eyes 3 (three) times daily as needed (for dry eyes).     . ticagrelor (BRILINTA) 90 MG TABS tablet Take 1 tablet (90 mg total) by mouth 2 (two) times  daily. 180 tablet 3  . Azelastine HCl 0.15 % SOLN Inhale 1 spray into the lungs daily.     No current facility-administered medications on file prior to visit.     Cardiovascular studies:  Cath 12/04/2017: LM: Normal 100% ostial LAD occlusion, successful PPI with Resolute Onyx DES 3.0 X 38 mm Moderate nonobstructive disease in RCA and LCx.  Echocardiogram 01/29/2018: Left ventricle cavity is normal in size. Accurate LVEF estimation limited to poor endocardial visualization. Visual EF  is 35-40%. Normal diastolic filling pattern. Left ventricle regional wall motion findings: Mid to distal LAD moderate hypokinesis.  Consider MRI for more accurate EF estimation.  Calculated EF 40%. Mild tricuspid regurgitation.  Compared to hospital echocardiogram dated 12/05/2017, LVEF is marginally improved.  Cardiac MRI 03/15/2018: Normal LV size. Mild basal septal hypertrophy. LGE: 50-75% in basal anteroseptal wall.          75-100% mid anteroseptal, anterior, apical septal amd anterior walls with poor chance of recovery.  Normal RV function. Mild MR, mild TR.  Normal sized great vessels  Recent labs: 12/06/2017: H/H 14/42.  MCV 92.  Patient was 212 Glucose 85.  BUN/creatinine 11/0.90.  EGFR normal.  Sodium 134, potassium 4.1. Peak troponin > 65 Cholesterol 183, HDL 31 and LDL 109, triglycerides 215   Review of Systems  Constitution: Negative for decreased appetite, malaise/fatigue, weight gain and weight loss.       Insomnia   HENT: Negative for congestion.   Eyes: Negative for visual disturbance.  Cardiovascular: Negative for chest pain, dyspnea on exertion, leg swelling, palpitations and syncope.  Respiratory: Negative for shortness of breath.   Endocrine: Negative for cold intolerance.  Hematologic/Lymphatic: Does not bruise/bleed easily.  Skin: Negative for itching and rash.  Musculoskeletal: Negative for myalgias.  Gastrointestinal: Negative for abdominal pain, nausea and vomiting.   Genitourinary: Negative for dysuria.  Neurological: Negative for dizziness and weakness.  Psychiatric/Behavioral: The patient is not nervous/anxious.   All other systems reviewed and are negative.        Vitals:   06/20/18 1448  BP: (!) 96/55  Pulse: (!) 51   (Measured by the patient using a home BP monitor)   Observation/findings during video visit   Objective:    Physical Exam  Constitutional: He is oriented to person, place, and time. He appears well-developed and well-nourished. No distress.  Pulmonary/Chest: Effort normal.  Musculoskeletal:        General: No edema.  Neurological: He is alert and oriented to person, place, and time.  Psychiatric: He has a normal mood and affect.  Nursing note and vitals reviewed.         Assessment & Recommendations:   63 y/o Caucasian male with CAD s/p prior LAD BMS 2011, anterior STEMI on 12/04/2017, s/p PPCI to ostial LAD, ischemic cardiomyopathy, hypertension, hyperlipidemia, here for 3 month follow up.  Ischemic cardiomyopathy: Preserved LVEF in spite of LAD infarct. While LGE is a known risk factor for arrhtymias, given improvment in LVEF, there is no indication for ICD anymore. Lifevest can now be taken off. Continue medical management of coronary artery disease and iscehmic cardiomyopathy. Allergic to lisinopril, thus unable to use Entresto. Continue losartan 50 mg daily, (reduced form 100 mg due to borderline low blood pressure), Bidil 1 tab 20-37.5 mg twice daily.  CAD: Coronary artery disease s/p ostial LAD PCI for anterior wall MI in 11/2017. Continue DAPT till 11/2018. Continue Repatha given his statin tolerance. Rest of the maangement as above.  H/o Junctional escape rhythm: Asymptomatic, likely due to OSA. Continue treatment of OSA.  Follow up in 6 months, following BMP and lipid panel check.  Nigel Mormon, MD [facility] Pager: [phone] Office: [phone] If no answer Cell  [phone]

## 2018-06-21 ENCOUNTER — Encounter: Payer: Self-pay | Admitting: Cardiology

## 2018-06-21 DIAGNOSIS — I2510 Atherosclerotic heart disease of native coronary artery without angina pectoris: Secondary | ICD-10-CM | POA: Insufficient documentation

## 2018-06-21 DIAGNOSIS — I255 Ischemic cardiomyopathy: Secondary | ICD-10-CM

## 2018-06-21 HISTORY — DX: Ischemic cardiomyopathy: I25.5

## 2018-07-10 ENCOUNTER — Other Ambulatory Visit: Payer: Self-pay | Admitting: Cardiology

## 2018-07-10 DIAGNOSIS — G4733 Obstructive sleep apnea (adult) (pediatric): Secondary | ICD-10-CM | POA: Diagnosis not present

## 2018-07-10 NOTE — Telephone Encounter (Signed)
Please fill

## 2018-07-17 ENCOUNTER — Telehealth: Payer: Self-pay

## 2018-07-17 NOTE — Telephone Encounter (Signed)
Noted! Thank you

## 2018-07-17 NOTE — Telephone Encounter (Signed)
Unable to get in contact with the patient to convert their office visit with [facility] on 07/24/2018 into a doxy.me visit. I left a voicemail asking the patient to return my call. Office number was provided.   If patient calls back please convert their office visit into a doxy.me visit.

## 2018-07-17 NOTE — Telephone Encounter (Signed)
pt feels the appointment is not needed at this time, he declined the doxy.me

## 2018-07-24 ENCOUNTER — Ambulatory Visit: Payer: BLUE CROSS/BLUE SHIELD | Admitting: Adult Health

## 2018-08-06 ENCOUNTER — Other Ambulatory Visit: Payer: Self-pay | Admitting: *Deleted

## 2018-08-06 DIAGNOSIS — Z20822 Contact with and (suspected) exposure to covid-19: Secondary | ICD-10-CM

## 2018-08-07 ENCOUNTER — Other Ambulatory Visit: Payer: Self-pay | Admitting: *Deleted

## 2018-08-07 DIAGNOSIS — Z20822 Contact with and (suspected) exposure to covid-19: Secondary | ICD-10-CM

## 2018-08-07 NOTE — Progress Notes (Signed)
[id number]

## 2018-08-07 NOTE — Addendum Note (Signed)
Addended by: Brigitte Pulse on: 08/07/2018 10:57 AM   Modules accepted: Orders

## 2018-08-09 LAB — NOVEL CORONAVIRUS, NAA: SARS-CoV-2, NAA: NOT DETECTED

## 2018-08-10 DIAGNOSIS — G4733 Obstructive sleep apnea (adult) (pediatric): Secondary | ICD-10-CM | POA: Diagnosis not present

## 2018-08-20 DIAGNOSIS — Z20828 Contact with and (suspected) exposure to other viral communicable diseases: Secondary | ICD-10-CM | POA: Diagnosis not present

## 2018-09-09 DIAGNOSIS — G4733 Obstructive sleep apnea (adult) (pediatric): Secondary | ICD-10-CM | POA: Diagnosis not present

## 2018-10-02 DIAGNOSIS — Z1159 Encounter for screening for other viral diseases: Secondary | ICD-10-CM | POA: Diagnosis not present

## 2018-10-05 DIAGNOSIS — Z125 Encounter for screening for malignant neoplasm of prostate: Secondary | ICD-10-CM | POA: Diagnosis not present

## 2018-10-05 DIAGNOSIS — Z0000 Encounter for general adult medical examination without abnormal findings: Secondary | ICD-10-CM | POA: Diagnosis not present

## 2018-10-08 DIAGNOSIS — H18603 Keratoconus, unspecified, bilateral: Secondary | ICD-10-CM | POA: Diagnosis not present

## 2018-10-08 DIAGNOSIS — H0100A Unspecified blepharitis right eye, upper and lower eyelids: Secondary | ICD-10-CM | POA: Diagnosis not present

## 2018-10-08 DIAGNOSIS — H25813 Combined forms of age-related cataract, bilateral: Secondary | ICD-10-CM | POA: Diagnosis not present

## 2018-10-08 DIAGNOSIS — H04123 Dry eye syndrome of bilateral lacrimal glands: Secondary | ICD-10-CM | POA: Diagnosis not present

## 2018-10-10 DIAGNOSIS — Z0000 Encounter for general adult medical examination without abnormal findings: Secondary | ICD-10-CM | POA: Diagnosis not present

## 2018-10-10 DIAGNOSIS — E875 Hyperkalemia: Secondary | ICD-10-CM | POA: Diagnosis not present

## 2018-10-10 DIAGNOSIS — G4733 Obstructive sleep apnea (adult) (pediatric): Secondary | ICD-10-CM | POA: Diagnosis not present

## 2018-10-10 DIAGNOSIS — Z23 Encounter for immunization: Secondary | ICD-10-CM | POA: Diagnosis not present

## 2018-10-10 DIAGNOSIS — Z6828 Body mass index (BMI) 28.0-28.9, adult: Secondary | ICD-10-CM | POA: Diagnosis not present

## 2018-10-10 DIAGNOSIS — Z1211 Encounter for screening for malignant neoplasm of colon: Secondary | ICD-10-CM | POA: Diagnosis not present

## 2018-10-10 DIAGNOSIS — D649 Anemia, unspecified: Secondary | ICD-10-CM | POA: Diagnosis not present

## 2018-10-24 ENCOUNTER — Telehealth: Payer: Self-pay

## 2018-11-10 DIAGNOSIS — G4733 Obstructive sleep apnea (adult) (pediatric): Secondary | ICD-10-CM | POA: Diagnosis not present

## 2018-11-20 DIAGNOSIS — Z23 Encounter for immunization: Secondary | ICD-10-CM | POA: Diagnosis not present

## 2018-11-30 ENCOUNTER — Other Ambulatory Visit (HOSPITAL_COMMUNITY): Payer: Self-pay | Admitting: Cardiology

## 2018-11-30 DIAGNOSIS — E782 Mixed hyperlipidemia: Secondary | ICD-10-CM | POA: Diagnosis not present

## 2018-12-01 LAB — LIPID PANEL W/O CHOL/HDL RATIO
Cholesterol, Total: 97 mg/dL — ABNORMAL LOW (ref 100–199)
HDL: 52 mg/dL (ref >39)
LDL Chol Calc (NIH): 32 mg/dL (ref 0–99)
Triglycerides: 52 mg/dL (ref 0–149)
VLDL Cholesterol Cal: 13 mg/dL (ref 5–40)

## 2018-12-07 ENCOUNTER — Ambulatory Visit: Payer: BLUE CROSS/BLUE SHIELD | Admitting: Cardiology

## 2018-12-07 ENCOUNTER — Other Ambulatory Visit: Payer: Self-pay

## 2018-12-07 ENCOUNTER — Ambulatory Visit: Payer: BC Managed Care – PPO

## 2018-12-07 ENCOUNTER — Encounter: Payer: Self-pay | Admitting: Cardiology

## 2018-12-07 VITALS — BP 111/73 | HR 80 | Temp 98.0°F | Ht 69.0 in | Wt 168.0 lb

## 2018-12-07 DIAGNOSIS — I499 Cardiac arrhythmia, unspecified: Secondary | ICD-10-CM

## 2018-12-07 DIAGNOSIS — I2510 Atherosclerotic heart disease of native coronary artery without angina pectoris: Secondary | ICD-10-CM

## 2018-12-07 DIAGNOSIS — I255 Ischemic cardiomyopathy: Secondary | ICD-10-CM

## 2018-12-07 DIAGNOSIS — R002 Palpitations: Secondary | ICD-10-CM

## 2018-12-07 MED ORDER — TICAGRELOR 60 MG PO TABS: 60.0000 mg | ORAL_TABLET | Freq: Two times a day (BID) | ORAL | 3 refills | Status: DC

## 2018-12-07 NOTE — Patient Instructions (Signed)
Please reduce Bidil 20-37.5 mg to 1/2 tab twice daily Change Brilinta to 60 mg twice daily

## 2018-12-07 NOTE — Progress Notes (Signed)
Subjective:   Michael Terrell, male    DOB: Dec 21, 1955, 63 y.o.   MRN: [id number]  Chief complaint:  Coronary artery disease   HPI  63 y/o Caucasian male with CAD s/p prior LAD BMS 2011, anterior STEMI on 12/04/2017, s/p PPCI to ostial LAD, ischemic cardiomyopathy with preserved EF, hypertension, hyperlipidemia.  Patient is doing well.  He walks and runs regularly without any symptoms of angina or dyspnea.  Denies any orthopnea, leg edema, PND.  Lipid panel has remarkably improved on Repatha, details below.  He has noticed modification regarding "irregular heartbeat" on his smart watch monitor.  There are limited tracings available which do not definitively show atrial fibrillation.  He is using CPAP, although admits to not being regular with the usage.  On a separate note, he questions if he can, I do not given that his literature review shows benefit more in African-American patients.   Past Medical History:  Diagnosis Date  . Acute anterior wall MI ([place]) 10/2009  . CAD (coronary artery disease)    BMS to LAD (2011)  . Chest pain   . Dyslipidemia   . Heart attack ([place]) 11/2017  . Hyperlipidemia   . Hypertension   . Sinus pause   . Snoring      Past Surgical History:  Procedure Laterality Date  . CARDIAC CATHETERIZATION  01/29/2010   patent LAD stent (3.5x36m Vision - Dr. TCorky Downs9/07/2009); 50% prox PLA stenosis, 50% distal RCA stenosis (Dr. HNorlene Duel  . CORONARY ANGIOPLASTY WITH STENT PLACEMENT  11/13/2009   3.5x295mVision BMS to LAD (Dr. T.Corky Downs . CORONARY/GRAFT ACUTE MI REVASCULARIZATION N/A 12/04/2017   Procedure: Coronary/Graft Acute MI Revascularization;  Surgeon: KeTroy SineMD;  [facility];  Service: Cardiovascular;  Laterality: N/A;  . KNEE SURGERY    . LEFT HEART CATH AND CORONARY ANGIOGRAPHY N/A 12/04/2017   Procedure: LEFT HEART CATH AND CORONARY ANGIOGRAPHY;  Surgeon: KeTroy SineMD;  [facility];  Service:  Cardiovascular;  Laterality: N/A;  . NM MYOCAR PERF WALL MOTION  02/2010   bruce myoview - normal pattern of perfusion, low risk, no ischemia demonstrated, low risk  . SHOULDER SURGERY    . TRANSTHORACIC ECHOCARDIOGRAM  02/2012   EF 5546-27%grade 1 diastolic dysfunction; mild MR; LA mildly dilated; upper limit borderline LA enlargement     Social History   Socioeconomic History  . Marital status: Married    Spouse name: Not on file  . Number of children: 2  . Years of education: Not on file  . Highest education level: Master's degree (e.g., MA, MS, MEng, MEd, MSW, MBA)  Occupational History  . 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 Financial resource strain: Not very hard  . Food insecurity    Worry: Never true    Inability: Never true  . Transportation needs    Medical: No    Non-medical: No  Tobacco Use  . Smoking status: Never Smoker  . Smokeless tobacco: Never Used  Substance and Sexual Activity  . Alcohol use: No    Alcohol/week: 0.0 standard drinks  . Drug use: No  . Sexual activity: Not on file  Lifestyle  . Physical activity    Days per week: 4 days    Minutes per session: 30 min  . Stress: To some extent  Relationships  . Social connections  Talks on phone: Not on file    Gets together: Not on file    Attends religious service: Not on file    Active member of club or organization: Not on file    Attends meetings of clubs or organizations: Not on file    Relationship status: Not on file  . Intimate partner violence    Fear of current or ex partner: Not on file    Emotionally abused: Not on file    Physically abused: Not on file    Forced sexual activity: Not on file  Other Topics Concern  . Not on file  Social History Narrative  . Not on file     Family History  Problem Relation Age of Onset  . CAD Father        MI in his 72s     Current Outpatient Medications on File Prior to  Visit  Medication Sig Dispense Refill  . aspirin 81 MG tablet Take 81 mg by mouth daily.     . Azelastine HCl 0.15 % SOLN Inhale 1 spray into the lungs daily.    Marland Kitchen BIDIL 20-37.5 MG tablet TAKE ONE TABLET BY MOUTH TWICE A DAY  180 tablet 3  . cetirizine (ZYRTEC) 10 MG tablet Take 10 mg by mouth daily as needed for allergies.     . Cholecalciferol (VITAMIN D-3 PO) Take 2,500 Units by mouth daily.    . Evolocumab (REPATHA SURECLICK) 662 MG/ML SOAJ Inject 140 mg into the skin every 14 (fourteen) days.    . famotidine (PEPCID) 20 MG tablet Take 20 mg by mouth daily.    Marland Kitchen levothyroxine (SYNTHROID, LEVOTHROID) 75 MCG tablet Take 75 mcg by mouth daily before breakfast.    . losartan (COZAAR) 50 MG tablet Take 1 tablet (50 mg total) by mouth daily. 90 tablet 2  . Melatonin 10 MG TABS Take 1 tablet by mouth daily.    . montelukast (SINGULAIR) 10 MG tablet Take 10 mg by mouth at bedtime as needed.     . nitroGLYCERIN (NITROSTAT) 0.4 MG SL tablet Place 1 tablet (0.4 mg total) under the tongue every 5 (five) minutes as needed for chest pain. 25 tablet 3  . Polyvinyl Alcohol-Povidone (REFRESH OP) Place 1 drop into both eyes 3 (three) times daily as needed (for dry eyes).      No current facility-administered medications on file prior to visit.     Cardiovascular studies:  EKG 12/07/2018: Sinus rhythm 82 bpm. First degree A-V block PR interval 240 msec  Left atrial enlargement.  Old anteroseptal infarct. Poor R wave progression.    Cath 12/04/2017: LM: Normal 100% ostial LAD occlusion, successful PPI with Resolute Onyx DES 3.0 X 38 mm Moderate nonobstructive disease in RCA and LCx.  Echocardiogram 01/29/2018: Left ventricle cavity is normal in size. Accurate LVEF estimation limited to poor endocardial visualization. Visual EF is 35-40%. Normal diastolic filling pattern. Left ventricle regional wall motion findings: Mid to distal LAD moderate hypokinesis.  Consider MRI for more accurate EF  estimation.  Calculated EF 40%. Mild tricuspid regurgitation.  Compared to hospital echocardiogram dated 12/05/2017, LVEF is marginally improved.  Cardiac MRI 03/15/2018: Normal LV size. Mild basal septal hypertrophy. LGE: 50-75% in basal anteroseptal wall.          75-100% mid anteroseptal, anterior, apical septal amd anterior walls with poor chance of recovery.  Normal RV function. Mild MR, mild TR.  Normal sized great vessels  Recent labs: Results for Michael Terrell, Michael "MARK" (  MRN [id number]) as of 12/07/2018 08:51  Ref. Range 11/30/2018 09:38  Cholesterol, Total Latest Ref Range: 100 - 199 mg/dL 97 (L)  HDL Cholesterol Latest Ref Range: >39 mg/dL 52  Triglycerides Latest Ref Range: 0 - 149 mg/dL 52  VLDL Cholesterol Cal Latest Ref Range: 5 - 40 mg/dL 13  LDL Chol Calc (NIH) Latest Ref Range: 0 - 99 mg/dL 32    12/06/2017: H/H 14/42.  MCV 92.  Patient was 212 Glucose 85.  BUN/creatinine 11/0.90.  EGFR normal.  Sodium 134, potassium 4.1. Peak troponin > 65 Cholesterol 183, HDL 31 and LDL 109, triglycerides 215   Review of Systems  Constitution: Negative for decreased appetite, malaise/fatigue, weight gain and weight loss.       Insomnia   HENT: Negative for congestion.   Eyes: Negative for visual disturbance.  Cardiovascular: Positive for irregular heartbeat (Noted on smartwatch). Negative for chest pain, dyspnea on exertion, leg swelling, palpitations and syncope.  Respiratory: Negative for cough and shortness of breath.   Endocrine: Negative for cold intolerance.  Hematologic/Lymphatic: Does not bruise/bleed easily.  Skin: Negative for itching and rash.  Musculoskeletal: Negative for myalgias.  Gastrointestinal: Negative for abdominal pain, nausea and vomiting.  Genitourinary: Negative for dysuria.  Neurological: Negative for dizziness and weakness.  Psychiatric/Behavioral: The patient is not nervous/anxious.   All other systems reviewed and are negative.         Vitals:   12/07/18 0833  BP: 111/73  Pulse: 80  Temp: 98 F (36.7 C)  SpO2: 98%    Objective:    Physical Exam  Constitutional: He is oriented to person, place, and time. He appears well-developed and well-nourished. No distress.  HENT:  Head: Normocephalic and atraumatic.  Eyes: Pupils are equal, round, and reactive to light. Conjunctivae are normal.  Neck: No JVD present.  Cardiovascular: Normal rate, regular rhythm and intact distal pulses.  Pulmonary/Chest: Effort normal and breath sounds normal. He has no wheezes. He has no rales.  Abdominal: Soft. Bowel sounds are normal. There is no rebound.  Musculoskeletal:        General: No edema.  Lymphadenopathy:    He has no cervical adenopathy.  Neurological: He is alert and oriented to person, place, and time. No cranial nerve deficit.  Skin: Skin is warm and dry.  Psychiatric: He has a normal mood and affect.  Nursing note and vitals reviewed.         Assessment & Recommendations:   63 y/o Caucasian male with CAD s/p prior LAD BMS 2011, anterior STEMI on 12/04/2017, s/p PPCI to ostial LAD, ischemic cardiomyopathy, hypertension, hyperlipidemia, here for 3 month follow up.  Ischemic cardiomyopathy: Preserved LVEF in spite of LAD infarct. While LGE is a known risk factor for arrhtymias, given improvment in LVEF and no clinical evidence of arrhtymias, there is no indication for ICD anymore LVEF improvement after STEMI portends good prognosis. Allergic to lisinopril, thus unable to use Entresto. Continue losartan 50 mg daily. Can reduce Bidil to 1/2 tab 20-37.5 mg twice daily, and eventually stop.  CAD: Coronary artery disease s/p ostial LAD PCI for anterior wall MI in 11/2017. Given his complex CAD and recurrent MI, recommend Brilinta 60 mg bid beyond 1 year, along with Aspirin 81 mg daily. Continue Repatha given his statin tolerance. Rest of the maangement as above.  Irregular heart beat: Noted on smartwatch. Will  check 4 week event monitor.   OSA: Emphazied compliance.  Follow up in 3 months.  [place],  MD [facility] Pager: [phone] Office: [phone] If no answer Cell [phone]

## 2018-12-14 ENCOUNTER — Telehealth: Payer: Self-pay | Admitting: Cardiology

## 2018-12-14 DIAGNOSIS — I455 Other specified heart block: Secondary | ICD-10-CM

## 2018-12-14 NOTE — Telephone Encounter (Signed)
Place the patient on event monitor to look for possibility of atrial fibrillation, there have been nightly episodes of up to 9.7-second sinus pauses without symptoms.  Patient has history of sinus pauses in the past and has also been evaluated by EP.  He does have obstructive sleep apnea and is currently using CPAP.  Given that these pauses are only at nighttime and are completely asymptomatic, there is no indication for pacemaker at this time.  Nigel Mormon, MD

## 2018-12-17 ENCOUNTER — Telehealth: Payer: Self-pay | Admitting: Cardiology

## 2018-12-17 DIAGNOSIS — I480 Paroxysmal atrial fibrillation: Secondary | ICD-10-CM

## 2018-12-17 MED ORDER — RIVAROXABAN 20 MG PO TABS: 20.0000 mg | ORAL_TABLET | Freq: Every day | ORAL | 3 refills | Status: DC

## 2018-12-17 NOTE — Telephone Encounter (Addendum)
Given his episodes of sinus pauses during sleep, most likely related to sleep disordered breathing, I do not recommend addition of beta-blocker or calcium channel blockers at this time.  Continue wearing event monitor to assess A. fib burden.  Noted first event of atrial fibrillation with slow ventricular rate on 10/19 at 4:04 PM. Given his episodes of sinus pauses during sleep, most likely related to sleep disordered breathing, I do not recommend addition of beta-blocker or calcium channel blockers at this time.  Continue wearing event monitor to assess A. fib burden.   Given his episodes of sinus pauses during sleep, most likely related to sleep disordered breathing, I do not recommend addition of beta-blocker or calcium channel blockers at this time.  Continue wearing event monitor to assess A. fib burden.  [id number] score 2, annual stroke risk 2.2%.  Recommend stopping Brilinta, and start Xarelto 20 mg once daily.  If no ongoing bleeding issues, okay to continue aspirin 81 mg daily.  Nigel Mormon, MD [facility]

## 2019-01-04 IMAGING — US US CAROTID DUPLEX BILAT
1 series · 13 of 24 positions shown · non-contrast
Comparison: None.

CLINICAL DATA: 61-year-old male with a history of multiple
cardiovascular risk factors.

Hypertension, known coronary disease, hyperlipidemia
EXAM:
BILATERAL CAROTID DUPLEX ULTRASOUND
TECHNIQUE: Gray scale imaging, color Doppler and duplex ultrasound were
performed of bilateral carotid and vertebral arteries in the neck.

[Series 1: us carotid duplex bilat · 0.06mm/px · 13 of 44 slices shown]
[im 1/44]
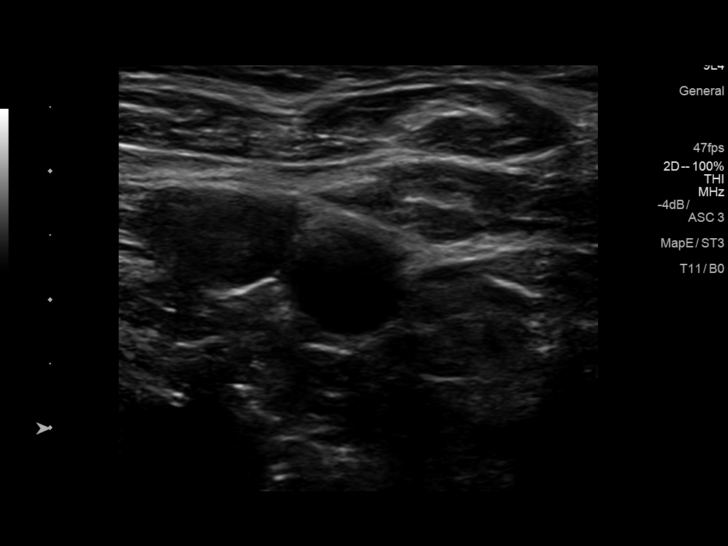
[im 4/44]
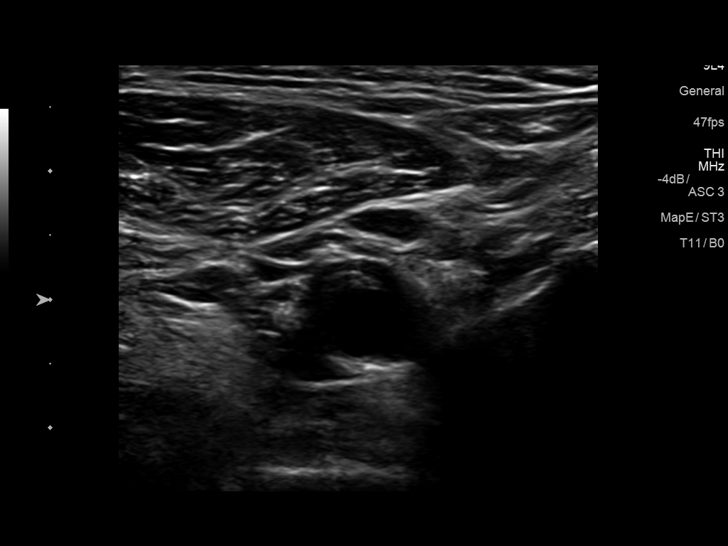
[im 8/44]
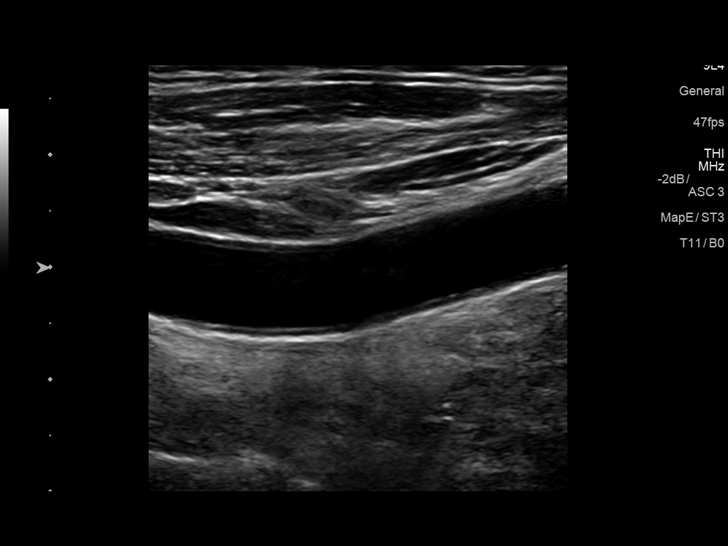
[im 12/44]
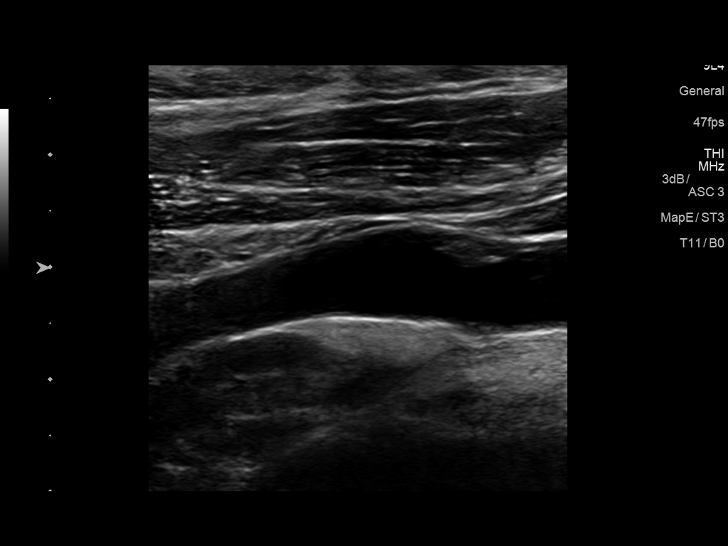
[im 15/44]
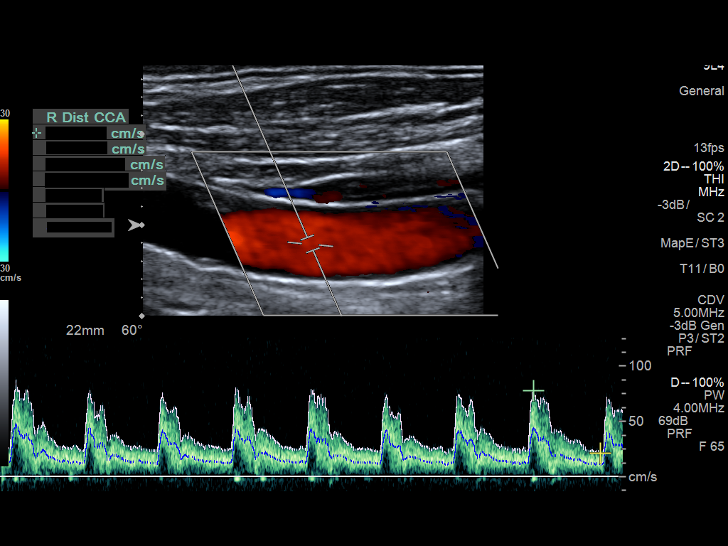
[im 19/44]
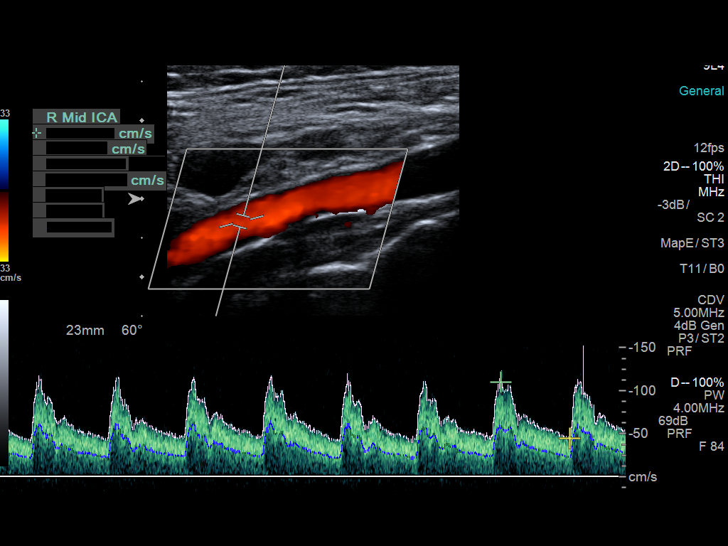
[im 23/44]
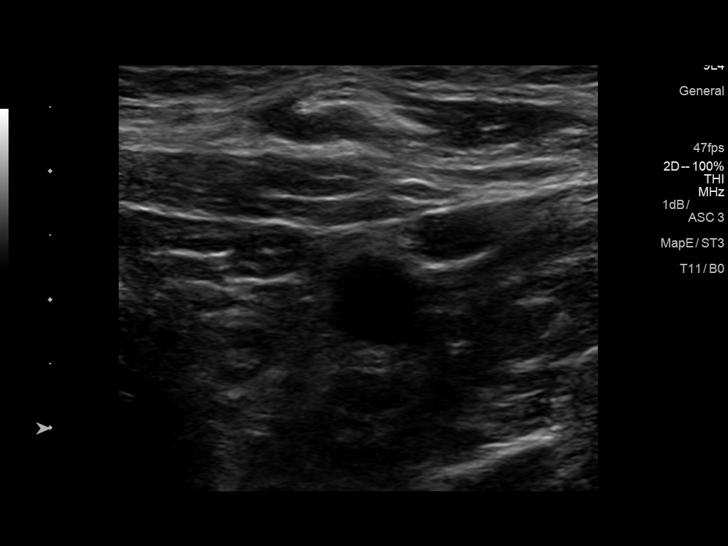
[im 25/44]
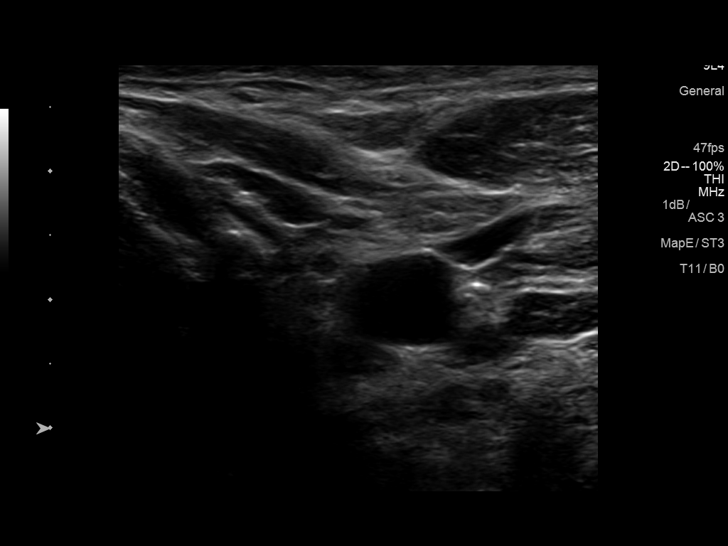
[im 29/44]
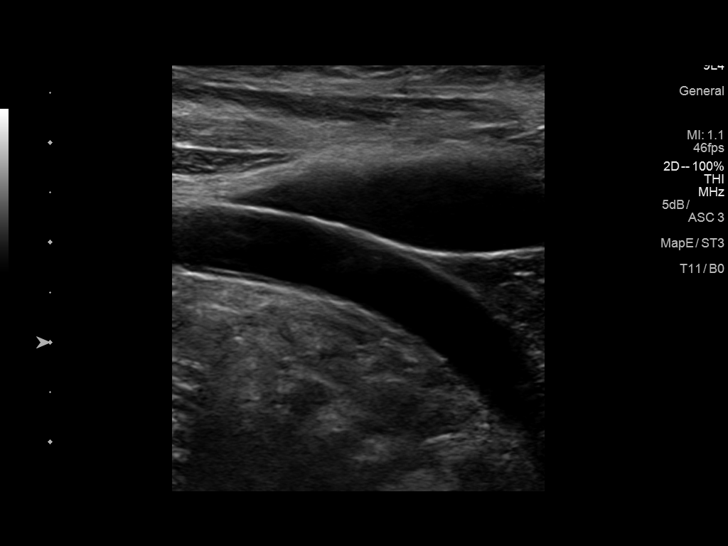
[im 32/44]
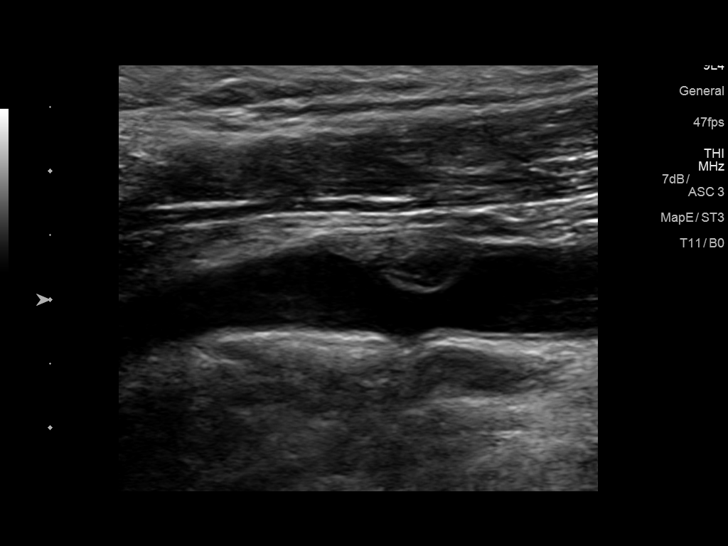
[im 36/44]
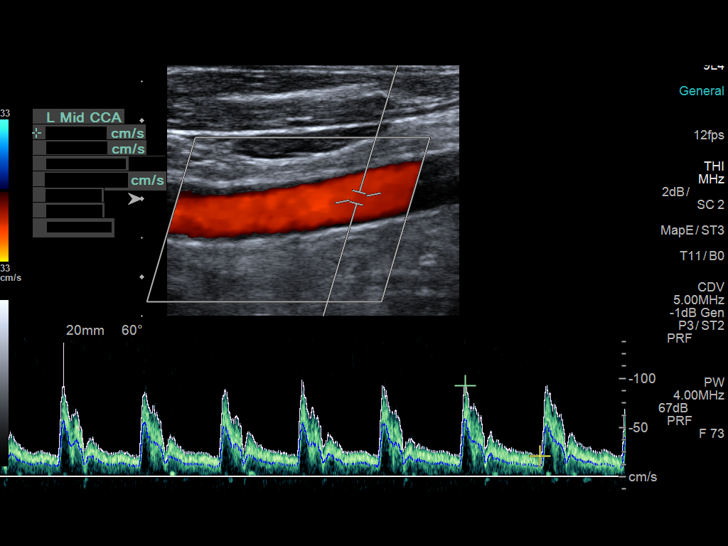
[im 40/44]
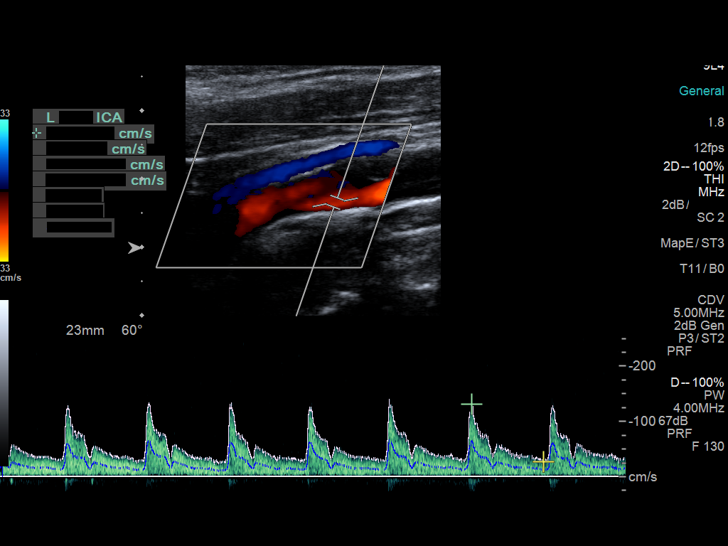
[im 44/44]
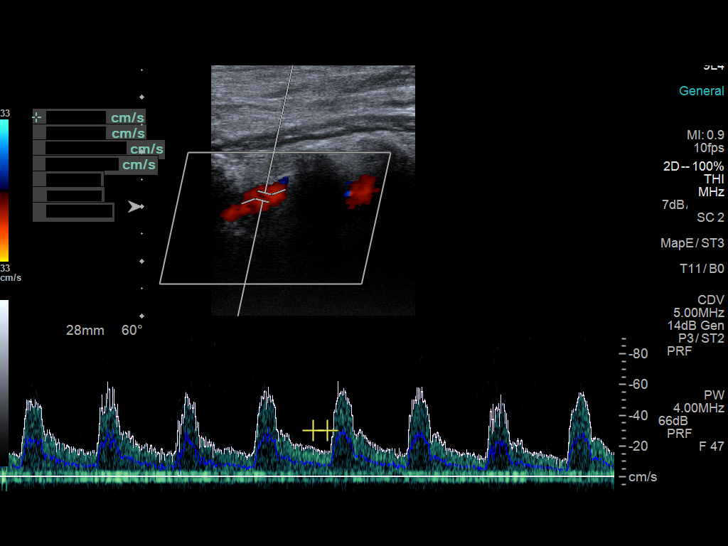

[13 of 24 positions shown; findings below may reference images not displayed]

FINDINGS: Criteria: Quantification of carotid stenosis is based on velocity
parameters that correlate the residual internal carotid diameter
with NASCET-based stenosis levels, using the diameter of the distal
internal carotid lumen as the denominator for stenosis measurement.

The following velocity measurements were obtained:

RIGHT

ICA:  Systolic 115 cm/sec, Diastolic 37 cm/sec

CCA:  117 cm/sec

SYSTOLIC ICA/CCA RATIO:

ECA:  106 cm/sec

LEFT

ICA:  Systolic 173 cm/sec, Diastolic 28 cm/sec

CCA:  93 cm/sec

SYSTOLIC ICA/CCA RATIO:

ECA:  177 cm/sec

Right Brachial SBP: 105

Left Brachial SBP: 110

RIGHT CAROTID ARTERY: No significant calcified disease of the right
common carotid artery. Intermediate waveform maintained.
Heterogeneous plaque without significant calcifications at the right
carotid bifurcation. Low resistance waveform of the right ICA. No
significant tortuosity.

RIGHT VERTEBRAL ARTERY: Antegrade flow, with late systolic reversal
and diastolic antegrade flow.

LEFT CAROTID ARTERY: No significant calcified disease of the left
common carotid artery. Intermediate waveform maintained.
Heterogeneous plaque at the left carotid bifurcation without
significant calcifications. Low resistance waveform of the left ICA.

LEFT VERTEBRAL ARTERY:  Antegrade flow with low resistance waveform.
IMPRESSION: Color duplex indicates minimal heterogeneous plaque, with no
hemodynamically significant stenosis by duplex criteria in the
extracranial cerebrovascular circulation.

Right vertebral artery waveform demonstrates late systolic reversal
with overall antegrade flow, suggesting developing stenosis of the
right innominate artery.

## 2019-01-17 ENCOUNTER — Telehealth: Payer: Self-pay | Admitting: Cardiology

## 2019-01-17 DIAGNOSIS — I2510 Atherosclerotic heart disease of native coronary artery without angina pectoris: Secondary | ICD-10-CM

## 2019-01-17 MED ORDER — TICAGRELOR 60 MG PO TABS: 60.0000 mg | ORAL_TABLET | Freq: Two times a day (BID) | ORAL | 3 refills | Status: DC

## 2019-01-17 NOTE — Telephone Encounter (Signed)
Event monitor 12/07/2018-01/05/2019: Baseline rhythm sinus.  Heart rate 27-149 bpm.  Average heart rate 62 bpm. 373 episodes of sinus pauses >3 seconds.  Longest pause 10 seconds, intermittent first-degree AV block, all during sleep hours, not symptomatic. No definite evidence of atrial fibrillation. Rate with possible junctional rhythm during sleep likely related to sleep disordered breathing. One episode of RVR likely atrial tachycardia.  No symptoms reported.   Discussed above findings with the patient.  Previously, I have started him on Xarelto, milligrams daily for possible atrial fibrillation noted on event monitor.  On detailed review of end of report, I do not believe.  Thus, I have asked him to stop Xarelto 20 mg daily, and asked to resume Brilinta at low-dose 60 mg twice daily for longer duration given his recurrent MI history.  He should start this on January 20, 2019.  Again, given his lack of symptoms with long sinus pauses during sleep, pacemaker is not indicated.  Nigel Mormon, MD

## 2019-03-05 ENCOUNTER — Encounter: Payer: Self-pay | Admitting: Cardiology

## 2019-03-05 ENCOUNTER — Ambulatory Visit: Payer: BC Managed Care – PPO | Admitting: Cardiology

## 2019-03-05 ENCOUNTER — Other Ambulatory Visit: Payer: Self-pay

## 2019-03-05 VITALS — BP 120/72 | HR 52 | Temp 97.9°F | Resp 16 | Ht 69.0 in | Wt 169.6 lb

## 2019-03-05 DIAGNOSIS — I2510 Atherosclerotic heart disease of native coronary artery without angina pectoris: Secondary | ICD-10-CM | POA: Diagnosis not present

## 2019-03-05 DIAGNOSIS — I455 Other specified heart block: Secondary | ICD-10-CM

## 2019-03-05 DIAGNOSIS — I480 Paroxysmal atrial fibrillation: Secondary | ICD-10-CM

## 2019-03-05 DIAGNOSIS — I255 Ischemic cardiomyopathy: Secondary | ICD-10-CM

## 2019-03-05 DIAGNOSIS — R5383 Other fatigue: Secondary | ICD-10-CM

## 2019-03-05 NOTE — Progress Notes (Signed)
Subjective:   Michael Terrell, male    DOB: [birth year], 63 y.o.   MRN: [id number]  Chief complaint:  Coronary artery disease   HPI  64 y/o Caucasian male with CAD s/p prior LAD BMS 2011, anterior STEMI on 12/04/2017, s/p PPCI to ostial LAD, ischemic cardiomyopathy with recovered EF, hypertension, hyperlipidemia.  Patient wore event monitor recently, that showed multile asmyptomatic pauses during sleep. There was no definite evidence of atrial fibrillation. Recently, he has felt days of fatigue, but they correlate with lack of sleep. He denies chest pain, shortness of breath, palpitations, leg edema, orthopnea, PND, TIA/syncope. He denies any bleeding issues.   Past Medical History:  Diagnosis Date  . Acute anterior wall MI ([place]) 10/2009  . CAD (coronary artery disease)    BMS to LAD (2011)  . Chest pain   . Dyslipidemia   . Heart attack ([place]) 11/2017  . Hyperlipidemia   . Hypertension   . Sinus pause   . Snoring      Past Surgical History:  Procedure Laterality Date  . CARDIAC CATHETERIZATION  01/29/2010   patent LAD stent (3.5x51m [facility] - Dr. TCorky Downs9/07/2009); 50% prox PLA stenosis, 50% distal RCA stenosis (Dr. HNorlene Duel  . CORONARY ANGIOPLASTY WITH STENT PLACEMENT  11/13/2009   3.5x260mVision BMS to LAD (Dr. T.Corky Downs . CORONARY/GRAFT ACUTE MI REVASCULARIZATION N/A 12/04/2017   Procedure: Coronary/Graft Acute MI Revascularization;  Surgeon: KeTroy SineMD;  [facility];  Service: Cardiovascular;  Laterality: N/A;  . KNEE SURGERY    . LEFT HEART CATH AND CORONARY ANGIOGRAPHY N/A 12/04/2017   Procedure: LEFT HEART CATH AND CORONARY ANGIOGRAPHY;  Surgeon: KeTroy SineMD;  [facility];  Service: Cardiovascular;  Laterality: N/A;  . NM MYOCAR PERF WALL MOTION  02/2010   bruce myoview - normal pattern of perfusion, low risk, no ischemia demonstrated, low risk  . SHOULDER SURGERY    . TRANSTHORACIC ECHOCARDIOGRAM  02/2012    EF 5543-15%grade 1 diastolic dysfunction; mild MR; LA mildly dilated; upper limit borderline LA enlargement     Social History   Socioeconomic History  . Marital status: Married    Spouse name: Not on file  . Number of children: 2  . Years of education: Not on file  . Highest education level: Master's degree (e.g., MA, MS, MEng, MEd, MSW, MBA)  Occupational History  . Occupation: FuNurse, mental healthor [employer]  Tobacco Use  . Smoking status: Never Smoker  . Smokeless tobacco: Never Used  Substance and Sexual Activity  . Alcohol use: No    Alcohol/week: 0.0 standard drinks  . Drug use: No  . Sexual activity: Not on file  Other Topics Concern  . Not on file  Social History Narrative  . Not on file   Social Determinants of Health   Financial Resource Strain:   . Difficulty of Paying Living Expenses: Not on file  Food Insecurity:   . Worried About RuCharity fundraisern the Last Year: Not on file  . Ran Out of Food in the Last Year: Not on file  Transportation Needs:   . Lack of Transportation (Medical): Not on file  . Lack of Transportation (Non-Medical): Not on file  Physical Activity:   . Days of Exercise per Week: Not on file  . Minutes of Exercise per Session: Not on file  Stress:   . Feeling  of Stress : Not on file  Social Connections:   . Frequency of Communication with Friends and Family: Not on file  . Frequency of Social Gatherings with Friends and Family: Not on file  . Attends Religious Services: Not on file  . Active Member of Clubs or Organizations: Not on file  . Attends Archivist Meetings: Not on file  . Marital Status: Not on file  Intimate Partner Violence:   . Fear of Current or Ex-Partner: Not on file  . Emotionally Abused: Not on file  . Physically Abused: Not on file  . Sexually Abused: Not on file     Family History  Problem Relation Age of Onset  . CAD Father        MI in his 36s  .  Heart disease Brother   . Heart disease Brother      Current Outpatient Medications on File Prior to Visit  Medication Sig Dispense Refill  . aspirin 81 MG tablet Take 81 mg by mouth daily.     . Azelastine HCl 0.15 % SOLN Inhale 1 spray into the lungs daily.    Marland Kitchen BIDIL 20-37.5 MG tablet TAKE ONE TABLET BY MOUTH TWICE A DAY  180 tablet 3  . cetirizine (ZYRTEC) 10 MG tablet Take 10 mg by mouth daily as needed for allergies.     . Cholecalciferol (VITAMIN D-3 PO) Take 2,500 Units by mouth daily.    . Evolocumab (REPATHA SURECLICK) 371 MG/ML SOAJ Inject 140 mg into the skin every 14 (fourteen) days.    . famotidine (PEPCID) 20 MG tablet Take 20 mg by mouth daily.    Marland Kitchen levothyroxine (SYNTHROID, LEVOTHROID) 75 MCG tablet Take 75 mcg by mouth daily before breakfast.    . losartan (COZAAR) 50 MG tablet Take 1 tablet (50 mg total) by mouth daily. 90 tablet 2  . Melatonin 10 MG TABS Take 1 tablet by mouth daily.    . montelukast (SINGULAIR) 10 MG tablet Take 10 mg by mouth at bedtime as needed.     . nitroGLYCERIN (NITROSTAT) 0.4 MG SL tablet Place 1 tablet (0.4 mg total) under the tongue every 5 (five) minutes as needed for chest pain. 25 tablet 3  . Polyvinyl Alcohol-Povidone (REFRESH OP) Place 1 drop into both eyes 3 (three) times daily as needed (for dry eyes).     . ticagrelor (BRILINTA) 60 MG TABS tablet Take 1 tablet (60 mg total) by mouth 2 (two) times daily. 180 tablet 3   No current facility-administered medications on file prior to visit.    Cardiovascular studies:  Event monitor 12/07/2018-01/05/2019: Baseline rhythm sinus.  Heart rate 27-149 bpm.  Average heart rate 62 bpm. 373 episodes of sinus pauses >3 seconds.  Longest pause 10 seconds, intermittent first-degree AV block, all during sleep hours, not symptomatic. No definite evidence of atrial fibrillation. Rate with possible junctional rhythm during sleep likely related to sleep disordered breathing. One episode of RVR likely  atrial tachycardia.  No symptoms reported.   Discussed above findings with the patient.  Previously, I have started him on Xarelto, milligrams daily for possible atrial fibrillation noted on event monitor.  On detailed review of end of report, I do not believe.  Thus, I have asked him to stop Xarelto 20 mg daily, and asked to resume Brilinta at low-dose 60 mg twice daily for longer duration given his recurrent MI history.  He should start this on January 20, 2019.  Again, given his lack of symptoms  with long sinus pauses during sleep, pacemaker is not indicated.   EKG 12/07/2018: Sinus rhythm 82 bpm. First degree A-V block PR interval 240 msec  Left atrial enlargement.  Old anteroseptal infarct. Poor R wave progression.    Cath 12/04/2017: LM: Normal 100% ostial LAD occlusion, successful PPI with Resolute Onyx DES 3.0 X 38 mm Moderate nonobstructive disease in RCA and LCx.  Echocardiogram 01/29/2018: Left ventricle cavity is normal in size. Accurate LVEF estimation limited to poor endocardial visualization. Visual EF is 35-40%. Normal diastolic filling pattern. Left ventricle regional wall motion findings: Mid to distal LAD moderate hypokinesis.  Consider MRI for more accurate EF estimation.  Calculated EF 40%. Mild tricuspid regurgitation.  Compared to hospital echocardiogram dated 12/05/2017, LVEF is marginally improved.  Cardiac MRI 03/15/2018: Normal LV size. Mild basal septal hypertrophy. LGE: 50-75% in basal anteroseptal wall.          75-100% mid anteroseptal, anterior, apical septal amd anterior walls with poor chance of recovery.  Normal RV function. Mild MR, mild TR.  Normal sized great vessels  Recent labs: Results for Michael Terrell, Michael Terrell "[place]al[place]" (MRN [id number]) as of 12/07/2018 08:51  Ref. Range 11/30/2018 09:38  Cholesterol, Total Latest Ref Range: 100 - 199 mg/dL 97 (L)  HDL Cholesterol Latest Ref Range: >39 mg/dL 52  Triglycerides Latest Ref Range: 0 - 149 mg/dL 52    VLDL Cholesterol Cal Latest Ref Range: 5 - 40 mg/dL 13  LDL Chol Calc (NIH) Latest Ref Range: 0 - 99 mg/dL 32    12/06/2017: H/H 14/42.  MCV 92.  Patient was 212 Glucose 85.  BUN/creatinine 11/0.90.  EGFR normal.  Sodium 134, potassium 4.1. Peak troponin > 65 Cholesterol 183, HDL 31 and LDL 109, triglycerides 215   Review of Systems  Constitution: Negative for decreased appetite, malaise/fatigue, weight gain and weight loss.       Insomnia   HENT: Negative for congestion.   Eyes: Negative for visual disturbance.  Cardiovascular: Positive for irregular heartbeat (Noted on smartwatch). Negative for chest pain, dyspnea on exertion, leg swelling, palpitations and syncope.  Respiratory: Negative for cough and shortness of breath.   Endocrine: Negative for cold intolerance.  Hematologic/Lymphatic: Does not bruise/bleed easily.  Skin: Negative for itching and rash.  Musculoskeletal: Negative for myalgias.  Gastrointestinal: Negative for abdominal pain, nausea and vomiting.  Genitourinary: Negative for dysuria.  Neurological: Negative for dizziness and weakness.  Psychiatric/Behavioral: The patient is not nervous/anxious.   All other systems reviewed and are negative.        Vitals:   03/05/19 1619  BP: 120/72  Pulse: (!) 52  Resp: 16  Temp: 97.9 F (36.6 C)  SpO2: 98%    Objective:    Physical Exam  Constitutional: He is oriented to person, place, and time. He appears well-developed and well-nourished. No distress.  HENT:  Head: Normocephalic and atraumatic.  Eyes: Pupils are equal, round, and reactive to light. Conjunctivae are normal.  Neck: No JVD present.  Cardiovascular: Normal rate, regular rhythm and intact distal pulses.  Pulmonary/Chest: Effort normal and breath sounds normal. He has no wheezes. He has no rales.  Abdominal: Soft. Bowel sounds are normal. There is no rebound.  Musculoskeletal:        General: No edema.  Lymphadenopathy:    He has no  cervical adenopathy.  Neurological: He is alert and oriented to person, place, and time. No cranial nerve deficit.  Skin: Skin is warm and dry.  Psychiatric: He has a normal  mood and affect.  Nursing note and vitals reviewed.         Assessment & Recommendations:   64 y/o Caucasian male with CAD s/p prior LAD BMS 2011, anterior STEMI on 12/04/2017, s/p PPCI to ostial LAD, ischemic cardiomyopathy, hypertension, hyperlipidemia, here for 3 month follow up.  Fatigue: Emphazied compliance with CPAP. Will check CBC, TSH.  Ischemic cardiomyopathy: Preserved LVEF in spite of LAD infarct. While LGE is a known risk factor for arrhtymias, given improvment in LVEF and no clinical evidence of arrhtymias, there is no indication for ICD anymore LVEF improvement after STEMI portends good prognosis. Allergic to lisinopril, thus unable to use Entresto. Continue losartan 50 mg daily.  CAD: Coronary artery disease s/p ostial LAD PCI for anterior wall MI in 11/2017. Given his complex CAD and recurrent MI, recommend Brilinta 60 mg bid beyond 1 year, along with Aspirin 81 mg daily. Continue Repatha given his statin tolerance. Rest of the maangement as above.  Irregular heart beat: Noted on smartwatch. No definite evidence of Afib. He did have multiple asymptomatic sinus pauses up tp 10 sec, during sleep. Given complete lack of symptoms during daytime, pacemaker is not warranted. He has been evaluated by EP in the past for similar situation.  OSA: Emphazied compliance.  Follow up in October 2021 after repeat lipid panel.   Nigel Mormon, MD [facility] [place] Pager: [phone] Office: [phone] If no answer Cell [phone]

## 2019-03-06 ENCOUNTER — Encounter: Payer: Self-pay | Admitting: Cardiology

## 2019-03-06 DIAGNOSIS — R5383 Other fatigue: Secondary | ICD-10-CM | POA: Insufficient documentation

## 2019-03-08 ENCOUNTER — Ambulatory Visit: Payer: BC Managed Care – PPO | Admitting: Cardiology

## 2019-03-14 ENCOUNTER — Other Ambulatory Visit: Payer: Self-pay | Admitting: Cardiology

## 2019-03-14 DIAGNOSIS — I2510 Atherosclerotic heart disease of native coronary artery without angina pectoris: Secondary | ICD-10-CM

## 2019-03-14 DIAGNOSIS — I255 Ischemic cardiomyopathy: Secondary | ICD-10-CM

## 2019-03-18 DIAGNOSIS — R5383 Other fatigue: Secondary | ICD-10-CM | POA: Diagnosis not present

## 2019-03-19 LAB — CBC
Hematocrit: 41.4 % (ref 37.5–51.0)
Hemoglobin: 13.3 g/dL (ref 13.0–17.7)
MCH: 28.9 pg (ref 26.6–33.0)
MCHC: 32.1 g/dL (ref 31.5–35.7)
MCV: 90 fL (ref 79–97)
Platelets: 220 10*3/uL (ref 150–450)
RBC: 4.61 x10E6/uL (ref 4.14–5.80)
RDW: 15.5 % — ABNORMAL HIGH (ref 11.6–15.4)
WBC: 6.2 10*3/uL (ref 3.4–10.8)

## 2019-03-19 LAB — TSH: TSH: 1.63 u[IU]/mL (ref 0.450–4.500)

## 2019-06-10 DIAGNOSIS — H18603 Keratoconus, unspecified, bilateral: Secondary | ICD-10-CM | POA: Diagnosis not present

## 2019-06-10 DIAGNOSIS — H04123 Dry eye syndrome of bilateral lacrimal glands: Secondary | ICD-10-CM | POA: Diagnosis not present

## 2019-06-10 DIAGNOSIS — H2513 Age-related nuclear cataract, bilateral: Secondary | ICD-10-CM | POA: Diagnosis not present

## 2019-06-10 DIAGNOSIS — H16103 Unspecified superficial keratitis, bilateral: Secondary | ICD-10-CM | POA: Diagnosis not present

## 2019-07-19 DIAGNOSIS — J309 Allergic rhinitis, unspecified: Secondary | ICD-10-CM | POA: Diagnosis not present

## 2019-07-19 DIAGNOSIS — E039 Hypothyroidism, unspecified: Secondary | ICD-10-CM | POA: Diagnosis not present

## 2019-07-19 DIAGNOSIS — I519 Heart disease, unspecified: Secondary | ICD-10-CM | POA: Diagnosis not present

## 2019-07-19 DIAGNOSIS — I10 Essential (primary) hypertension: Secondary | ICD-10-CM | POA: Diagnosis not present

## 2019-08-08 DIAGNOSIS — R5383 Other fatigue: Secondary | ICD-10-CM | POA: Diagnosis not present

## 2019-08-08 DIAGNOSIS — I10 Essential (primary) hypertension: Secondary | ICD-10-CM | POA: Diagnosis not present

## 2019-08-08 DIAGNOSIS — I519 Heart disease, unspecified: Secondary | ICD-10-CM | POA: Diagnosis not present

## 2019-08-08 DIAGNOSIS — E559 Vitamin D deficiency, unspecified: Secondary | ICD-10-CM | POA: Diagnosis not present

## 2019-08-14 DIAGNOSIS — R5383 Other fatigue: Secondary | ICD-10-CM | POA: Diagnosis not present

## 2019-08-14 DIAGNOSIS — E782 Mixed hyperlipidemia: Secondary | ICD-10-CM | POA: Diagnosis not present

## 2019-08-14 DIAGNOSIS — G4700 Insomnia, unspecified: Secondary | ICD-10-CM | POA: Diagnosis not present

## 2019-08-14 DIAGNOSIS — E611 Iron deficiency: Secondary | ICD-10-CM | POA: Diagnosis not present

## 2019-09-09 DIAGNOSIS — D649 Anemia, unspecified: Secondary | ICD-10-CM | POA: Diagnosis not present

## 2019-09-09 DIAGNOSIS — R5383 Other fatigue: Secondary | ICD-10-CM | POA: Diagnosis not present

## 2019-09-13 DIAGNOSIS — I429 Cardiomyopathy, unspecified: Secondary | ICD-10-CM | POA: Diagnosis not present

## 2019-09-13 DIAGNOSIS — R5383 Other fatigue: Secondary | ICD-10-CM | POA: Diagnosis not present

## 2019-09-13 DIAGNOSIS — G4700 Insomnia, unspecified: Secondary | ICD-10-CM | POA: Diagnosis not present

## 2019-09-13 DIAGNOSIS — E611 Iron deficiency: Secondary | ICD-10-CM | POA: Diagnosis not present

## 2019-10-11 DIAGNOSIS — Z1159 Encounter for screening for other viral diseases: Secondary | ICD-10-CM | POA: Diagnosis not present

## 2019-10-11 DIAGNOSIS — Z0000 Encounter for general adult medical examination without abnormal findings: Secondary | ICD-10-CM | POA: Diagnosis not present

## 2019-10-16 DIAGNOSIS — Z0000 Encounter for general adult medical examination without abnormal findings: Secondary | ICD-10-CM | POA: Diagnosis not present

## 2019-10-16 DIAGNOSIS — Z1211 Encounter for screening for malignant neoplasm of colon: Secondary | ICD-10-CM | POA: Diagnosis not present

## 2019-11-26 ENCOUNTER — Other Ambulatory Visit: Payer: Self-pay | Admitting: Cardiology

## 2019-11-26 DIAGNOSIS — I255 Ischemic cardiomyopathy: Secondary | ICD-10-CM

## 2019-12-12 ENCOUNTER — Other Ambulatory Visit (HOSPITAL_COMMUNITY): Payer: Self-pay | Admitting: Cardiology

## 2019-12-12 ENCOUNTER — Other Ambulatory Visit: Payer: Self-pay | Admitting: Cardiology

## 2019-12-12 ENCOUNTER — Other Ambulatory Visit: Payer: Self-pay

## 2019-12-12 DIAGNOSIS — I2510 Atherosclerotic heart disease of native coronary artery without angina pectoris: Secondary | ICD-10-CM

## 2019-12-13 LAB — CBC WITH DIFFERENTIAL/PLATELET
Basophils Absolute: 0 10*3/uL (ref 0.0–0.2)
Basos: 1 %
EOS (ABSOLUTE): 0.1 10*3/uL (ref 0.0–0.4)
Eos: 2 %
Hematocrit: 41.7 % (ref 37.5–51.0)
Hemoglobin: 14.5 g/dL (ref 13.0–17.7)
Immature Grans (Abs): 0 10*3/uL (ref 0.0–0.1)
Immature Granulocytes: 0 %
Lymphocytes Absolute: 1.2 10*3/uL (ref 0.7–3.1)
Lymphs: 23 %
MCH: 33 pg (ref 26.6–33.0)
MCHC: 34.8 g/dL (ref 31.5–35.7)
MCV: 95 fL (ref 79–97)
Monocytes Absolute: 0.5 10*3/uL (ref 0.1–0.9)
Monocytes: 9 %
Neutrophils Absolute: 3.2 10*3/uL (ref 1.4–7.0)
Neutrophils: 65 %
Platelets: 209 10*3/uL (ref 150–450)
RBC: 4.39 x10E6/uL (ref 4.14–5.80)
RDW: 13.1 % (ref 11.6–15.4)
WBC: 5 10*3/uL (ref 3.4–10.8)

## 2019-12-13 LAB — COMPREHENSIVE METABOLIC PANEL
ALT: 13 IU/L (ref 0–44)
AST: 21 IU/L (ref 0–40)
Albumin/Globulin Ratio: 1.9 (ref 1.2–2.2)
Albumin: 4.6 g/dL (ref 3.8–4.8)
Alkaline Phosphatase: 89 IU/L (ref 44–121)
BUN/Creatinine Ratio: 17 (ref 10–24)
BUN: 17 mg/dL (ref 8–27)
Bilirubin Total: 0.6 mg/dL (ref 0.0–1.2)
CO2: 24 mmol/L (ref 20–29)
Calcium: 9.4 mg/dL (ref 8.6–10.2)
Chloride: 103 mmol/L (ref 96–106)
Creatinine, Ser: 1.02 mg/dL (ref 0.76–1.27)
GFR calc Af Amer: 89 mL/min/{1.73_m2} (ref >59)
GFR calc non Af Amer: 77 mL/min/{1.73_m2} (ref >59)
Globulin, Total: 2.4 g/dL (ref 1.5–4.5)
Glucose: 88 mg/dL (ref 65–99)
Potassium: 4.8 mmol/L (ref 3.5–5.2)
Sodium: 140 mmol/L (ref 134–144)
Total Protein: 7 g/dL (ref 6.0–8.5)

## 2019-12-13 LAB — LIPID PANEL WITH LDL/HDL RATIO
Cholesterol, Total: 103 mg/dL (ref 100–199)
HDL: 47 mg/dL (ref >39)
LDL Chol Calc (NIH): 36 mg/dL (ref 0–99)
LDL/HDL Ratio: 0.8 ratio (ref 0.0–3.6)
Triglycerides: 107 mg/dL (ref 0–149)
VLDL Cholesterol Cal: 20 mg/dL (ref 5–40)

## 2019-12-16 ENCOUNTER — Ambulatory Visit: Payer: BC Managed Care – PPO | Admitting: Cardiology

## 2019-12-16 ENCOUNTER — Encounter: Payer: Self-pay | Admitting: Cardiology

## 2019-12-16 ENCOUNTER — Other Ambulatory Visit: Payer: Self-pay

## 2019-12-16 VITALS — BP 132/88 | HR 78 | Ht 69.0 in | Wt 171.0 lb

## 2019-12-16 DIAGNOSIS — I2510 Atherosclerotic heart disease of native coronary artery without angina pectoris: Secondary | ICD-10-CM | POA: Diagnosis not present

## 2019-12-16 DIAGNOSIS — I455 Other specified heart block: Secondary | ICD-10-CM

## 2019-12-16 DIAGNOSIS — I255 Ischemic cardiomyopathy: Secondary | ICD-10-CM | POA: Diagnosis not present

## 2019-12-16 DIAGNOSIS — I480 Paroxysmal atrial fibrillation: Secondary | ICD-10-CM

## 2019-12-16 MED ORDER — TICAGRELOR 60 MG PO TABS: 60.0000 mg | ORAL_TABLET | Freq: Two times a day (BID) | ORAL | 3 refills | Status: DC

## 2019-12-16 NOTE — Progress Notes (Signed)
Subjective:   Michael Terrell, male    DOB: 01/06/1956, 64 y.o.   MRN: [id number]  Chief complaint:  Coronary artery disease   HPI  64 y/o Caucasian male with CAD s/p prior LAD BMS 2011, anterior STEMI on 12/04/2017, s/p PPCI to ostial LAD, ischemic cardiomyopathy with recovered EF, hypertension, hyperlipidemia.  Reviewed recent labs with the patient, details below. Patient is doing well, denies chest pain, shortness of breath, palpitations, leg edema, orthopnea, PND, TIA/syncope. He has no significant bleeding issues.    Current Outpatient Medications on File Prior to Visit  Medication Sig Dispense Refill  . aspirin 81 MG tablet Take 81 mg by mouth daily.     . Azelastine HCl 0.15 % SOLN Inhale 1 spray into the lungs daily.    Marland Kitchen BRILINTA 60 MG TABS tablet TAKE 1 TABLET BY MOUTH 2 TIMES DAILY 60 tablet 3  . cetirizine (ZYRTEC) 10 MG tablet Take 10 mg by mouth daily as needed for allergies.     . Cholecalciferol (VITAMIN D-3 PO) Take 2,500 Units by mouth daily.    . Evolocumab (REPATHA SURECLICK) 944 MG/ML SOAJ Inject 140 mg into the skin every 14 (fourteen) days.    . famotidine (PEPCID) 20 MG tablet Take 20 mg by mouth daily.    Marland Kitchen levothyroxine (SYNTHROID, LEVOTHROID) 75 MCG tablet Take 75 mcg by mouth daily before breakfast.    . losartan (COZAAR) 50 MG tablet TAKE 1 TABLET BY MOUTH EVERY DAY 90 tablet 2  . Melatonin 10 MG TABS Take 1 tablet by mouth daily.    . montelukast (SINGULAIR) 10 MG tablet Take 10 mg by mouth at bedtime as needed.     . nitroGLYCERIN (NITROSTAT) 0.4 MG SL tablet Place 1 tablet (0.4 mg total) under the tongue every 5 (five) minutes as needed for chest pain. 25 tablet 3  . Polyvinyl Alcohol-Povidone (REFRESH OP) Place 1 drop into both eyes 3 (three) times daily as needed (for dry eyes).      No current facility-administered medications on file prior to visit.    Cardiovascular studies:  Event monitor 12/07/2018-01/05/2019: Baseline rhythm sinus.   Heart rate 27-149 bpm.  Average heart rate 62 bpm. 373 episodes of sinus pauses >3 seconds.  Longest pause 10 seconds, intermittent first-degree AV block, all during sleep hours, not symptomatic. No definite evidence of atrial fibrillation. Rate with possible junctional rhythm during sleep likely related to sleep disordered breathing. One episode of RVR likely atrial tachycardia.  No symptoms reported.   Discussed above findings with the patient.  Previously, I have started him on Xarelto, milligrams daily for possible atrial fibrillation noted on event monitor.  On detailed review of end of report, I do not believe.  Thus, I have asked him to stop Xarelto 20 mg daily, and asked to resume Brilinta at low-dose 60 mg twice daily for longer duration given his recurrent MI history.  He should start this on January 20, 2019.  Again, given his lack of symptoms with long sinus pauses during sleep, pacemaker is not indicated.   EKG 12/07/2018: Sinus rhythm 82 bpm. First degree A-V block PR interval 240 msec  Left atrial enlargement.  Old anteroseptal infarct. Poor R wave progression.    Cath 12/04/2017: LM: Normal 100% ostial LAD occlusion, successful PPI with Resolute Onyx DES 3.0 X 38 mm Moderate nonobstructive disease in RCA and LCx.  Echocardiogram 01/29/2018: Left ventricle cavity is normal in size. Accurate LVEF estimation limited to poor endocardial visualization.  Visual EF is 35-40%. Normal diastolic filling pattern. Left ventricle regional wall motion findings: Mid to distal LAD moderate hypokinesis.  Consider MRI for more accurate EF estimation.  Calculated EF 40%. Mild tricuspid regurgitation.  Compared to hospital echocardiogram dated 12/05/2017, LVEF is marginally improved.  Cardiac MRI 03/15/2018: Normal LV size. Mild basal septal hypertrophy. LGE: 50-75% in basal anteroseptal wall.          75-100% mid anteroseptal, anterior, apical septal amd anterior walls with poor chance of  recovery.  Normal RV function. Mild MR, mild TR.  Normal sized great vessels  Recent labs: 12/12/2019: Glucose 86, BUN/Cr 17/1.02. EGFR 77. Na/K 140/4.8. Rest of the CMP normal H/H 14/41. MCV 95. Platelets 209 Chol 103, TG 107, HDL 47, LDL 36  03/2019: TSH 1.6   12/06/2017: H/H 14/42.  MCV 92.  Patient was 212 Glucose 85.  BUN/creatinine 11/0.90.  EGFR normal.  Sodium 134, potassium 4.1. Peak troponin > 65 Cholesterol 183, HDL 31 and LDL 109, triglycerides 215   Review of Systems  Cardiovascular: Negative for chest pain, dyspnea on exertion, leg swelling, palpitations and syncope.         Vitals:   12/16/19 0923  BP: 132/88  Pulse: 78  SpO2: 98%    Objective:    Physical Exam Vitals and nursing note reviewed.  Constitutional:      General: He is not in acute distress. Neck:     Vascular: No JVD.  Cardiovascular:     Rate and Rhythm: Normal rate and regular rhythm.     Heart sounds: Normal heart sounds. No murmur heard.   Pulmonary:     Effort: Pulmonary effort is normal.     Breath sounds: Normal breath sounds. No wheezing or rales.           Assessment & Recommendations:   64 y/o Caucasian male with CAD s/p prior LAD BMS 2011, anterior STEMI on 12/04/2017, s/p PPCI to ostial LAD, ischemic cardiomyopathy with recovered EF, hypertension, hyperlipidemia.  Ischemic cardiomyopathy: Preserved LVEF in spite of LAD infarct (Cardiac MRI 02/2018 EF 45%).  While LGE is a known risk factor for arrhtymias, given improvment in LVEF and no clinical evidence of arrhtymias, there is no indication for ICD anymore LVEF improvement after STEMI portends good prognosis. Allergic to lisinopril, thus unable to use Entresto. Continue losartan 50 mg daily.   CAD: Coronary artery disease s/p ostial LAD PCI for anterior wall MI in 11/2017. Continue Brilinta 60 mg bid till 11/2020. Conintue Aspirin 81 mg daily.  Continue Repatha given his statin tolerance. LDL 36.  Rest of  the maangement as above.  OSA: Emphazied compliance.  F/u in 1 year  Nigel Mormon, MD [facility] [place] Pager: [phone] Office: [phone] If no answer Cell [phone]

## 2019-12-27 DIAGNOSIS — Z23 Encounter for immunization: Secondary | ICD-10-CM | POA: Diagnosis not present

## 2020-04-30 DIAGNOSIS — E782 Mixed hyperlipidemia: Secondary | ICD-10-CM | POA: Diagnosis not present

## 2020-04-30 DIAGNOSIS — D649 Anemia, unspecified: Secondary | ICD-10-CM | POA: Diagnosis not present

## 2020-04-30 DIAGNOSIS — E039 Hypothyroidism, unspecified: Secondary | ICD-10-CM | POA: Diagnosis not present

## 2020-05-04 DIAGNOSIS — R5383 Other fatigue: Secondary | ICD-10-CM | POA: Diagnosis not present

## 2020-05-04 DIAGNOSIS — I10 Essential (primary) hypertension: Secondary | ICD-10-CM | POA: Diagnosis not present

## 2020-05-04 DIAGNOSIS — E782 Mixed hyperlipidemia: Secondary | ICD-10-CM | POA: Diagnosis not present

## 2020-05-04 DIAGNOSIS — D649 Anemia, unspecified: Secondary | ICD-10-CM | POA: Diagnosis not present

## 2020-06-22 DIAGNOSIS — J309 Allergic rhinitis, unspecified: Secondary | ICD-10-CM | POA: Diagnosis not present

## 2020-06-22 DIAGNOSIS — T82855A Stenosis of coronary artery stent, initial encounter: Secondary | ICD-10-CM | POA: Diagnosis not present

## 2020-06-22 DIAGNOSIS — Z23 Encounter for immunization: Secondary | ICD-10-CM | POA: Diagnosis not present

## 2020-06-22 DIAGNOSIS — I10 Essential (primary) hypertension: Secondary | ICD-10-CM | POA: Diagnosis not present

## 2020-06-22 DIAGNOSIS — Z7185 Encounter for immunization safety counseling: Secondary | ICD-10-CM | POA: Diagnosis not present

## 2020-07-14 DIAGNOSIS — M170 Bilateral primary osteoarthritis of knee: Secondary | ICD-10-CM | POA: Diagnosis not present

## 2020-08-19 DIAGNOSIS — I10 Essential (primary) hypertension: Secondary | ICD-10-CM | POA: Diagnosis not present

## 2020-08-19 DIAGNOSIS — I519 Heart disease, unspecified: Secondary | ICD-10-CM | POA: Diagnosis not present

## 2020-08-19 DIAGNOSIS — T82855A Stenosis of coronary artery stent, initial encounter: Secondary | ICD-10-CM | POA: Diagnosis not present

## 2020-08-19 DIAGNOSIS — E782 Mixed hyperlipidemia: Secondary | ICD-10-CM | POA: Diagnosis not present

## 2020-10-13 DIAGNOSIS — Z0000 Encounter for general adult medical examination without abnormal findings: Secondary | ICD-10-CM | POA: Diagnosis not present

## 2020-10-13 DIAGNOSIS — Z125 Encounter for screening for malignant neoplasm of prostate: Secondary | ICD-10-CM | POA: Diagnosis not present

## 2020-10-15 DIAGNOSIS — Z0000 Encounter for general adult medical examination without abnormal findings: Secondary | ICD-10-CM | POA: Diagnosis not present

## 2020-10-15 DIAGNOSIS — R55 Syncope and collapse: Secondary | ICD-10-CM | POA: Diagnosis not present

## 2020-10-15 DIAGNOSIS — H6121 Impacted cerumen, right ear: Secondary | ICD-10-CM | POA: Diagnosis not present

## 2020-10-15 DIAGNOSIS — I10 Essential (primary) hypertension: Secondary | ICD-10-CM | POA: Diagnosis not present

## 2020-10-15 DIAGNOSIS — Z23 Encounter for immunization: Secondary | ICD-10-CM | POA: Diagnosis not present

## 2020-10-15 DIAGNOSIS — I519 Heart disease, unspecified: Secondary | ICD-10-CM | POA: Diagnosis not present

## 2020-10-15 DIAGNOSIS — I429 Cardiomyopathy, unspecified: Secondary | ICD-10-CM | POA: Diagnosis not present

## 2020-10-15 DIAGNOSIS — Z1211 Encounter for screening for malignant neoplasm of colon: Secondary | ICD-10-CM | POA: Diagnosis not present

## 2020-12-14 ENCOUNTER — Other Ambulatory Visit: Payer: Self-pay

## 2020-12-14 DIAGNOSIS — I480 Paroxysmal atrial fibrillation: Secondary | ICD-10-CM | POA: Diagnosis not present

## 2020-12-14 DIAGNOSIS — I10 Essential (primary) hypertension: Secondary | ICD-10-CM | POA: Diagnosis not present

## 2020-12-14 DIAGNOSIS — I2510 Atherosclerotic heart disease of native coronary artery without angina pectoris: Secondary | ICD-10-CM | POA: Diagnosis not present

## 2020-12-15 LAB — BASIC METABOLIC PANEL
BUN/Creatinine Ratio: 18 (ref 10–24)
BUN: 19 mg/dL (ref 8–27)
CO2: 21 mmol/L (ref 20–29)
Calcium: 9.1 mg/dL (ref 8.6–10.2)
Chloride: 101 mmol/L (ref 96–106)
Creatinine, Ser: 1.04 mg/dL (ref 0.76–1.27)
Glucose: 87 mg/dL (ref 70–99)
Potassium: 4.5 mmol/L (ref 3.5–5.2)
Sodium: 137 mmol/L (ref 134–144)
eGFR: 79 mL/min/{1.73_m2} (ref >59)

## 2020-12-15 LAB — LIPID PANEL WITH LDL/HDL RATIO
Cholesterol, Total: 100 mg/dL (ref 100–199)
HDL: 51 mg/dL (ref >39)
LDL Chol Calc (NIH): 35 mg/dL (ref 0–99)
LDL/HDL Ratio: 0.7 ratio (ref 0.0–3.6)
Triglycerides: 63 mg/dL (ref 0–149)
VLDL Cholesterol Cal: 14 mg/dL (ref 5–40)

## 2020-12-16 ENCOUNTER — Ambulatory Visit: Payer: BC Managed Care – PPO | Admitting: Cardiology

## 2020-12-16 ENCOUNTER — Other Ambulatory Visit: Payer: Self-pay

## 2020-12-16 ENCOUNTER — Encounter: Payer: Self-pay | Admitting: Cardiology

## 2020-12-16 ENCOUNTER — Other Ambulatory Visit: Payer: Self-pay | Admitting: Cardiology

## 2020-12-16 VITALS — BP 140/86 | HR 80 | Temp 97.8°F | Resp 17 | Ht 69.0 in | Wt 171.0 lb

## 2020-12-16 DIAGNOSIS — I2510 Atherosclerotic heart disease of native coronary artery without angina pectoris: Secondary | ICD-10-CM

## 2020-12-16 DIAGNOSIS — R55 Syncope and collapse: Secondary | ICD-10-CM

## 2020-12-16 DIAGNOSIS — I255 Ischemic cardiomyopathy: Secondary | ICD-10-CM

## 2020-12-16 NOTE — Progress Notes (Signed)
Subjective:   Michael Terrell, male    DOB: [birth year], 65 y.o.   MRN: [id number]  Chief complaint:  Coronary artery disease   HPI  65 y/o Caucasian male with hypertension, hyperlipidemia, CAD s/p prior LAD BMS 2011, anterior STEMI on 12/04/2017, s/p PPCI to ostial LAD, ischemic cardiomyopathy with recovered EF, h/o sinus pauses, OSA-noncompliant with CPAP  Patient denies any chest pain, shortness of breath symptoms.  He stays active with regular walking.  Blood pressure well controlled.  Lipid panel reviewed with the patient, well controlled.  In June 2022, patient had an episode of, as detailed below.  He was running outside at 6 AM.  It was not a particularly hard morning.  He initially thought that he fell and had some minor facial injuries.  However, on further thinking about this episode, he states that he does not recall the actual event of falling.  He did not sustain any injuries on his arms or hands, to suggest that he tried to break the fall.  Retrospectively thinking, he thinks he actually lost consciousness for an unclear amount of time.  Patient does have a cardia mobile monitor, but he did not have any recording from this particular event. Patient was last seen by Dr. Lovena Le in 2015, primarily for asymptomatic sinus pauses at night.  On a separate note, patient's losartan was stopped by his PCP Dr. Ernie Hew, as his nighttime sweats were attributed to losartan.  Nighttime sweats have indeed stopped after discontinuation of losartan.  Current Outpatient Medications on File Prior to Visit  Medication Sig Dispense Refill   aspirin 81 MG tablet Take 81 mg by mouth daily.      Azelastine HCl 0.15 % SOLN Inhale 1 spray into the lungs daily.     cetirizine (ZYRTEC) 10 MG tablet Take 10 mg by mouth daily as needed for allergies.      Cholecalciferol (VITAMIN D-3 PO) Take 2,500 Units by mouth daily.     Evolocumab (REPATHA SURECLICK) 585 MG/ML SOAJ Inject 140 mg into the skin every  14 (fourteen) days.     famotidine (PEPCID) 20 MG tablet Take 20 mg by mouth daily.     levothyroxine (SYNTHROID, LEVOTHROID) 75 MCG tablet Take 75 mcg by mouth daily before breakfast.     losartan (COZAAR) 50 MG tablet TAKE 1 TABLET BY MOUTH EVERY DAY 90 tablet 2   Melatonin 10 MG TABS Take 1 tablet by mouth daily.     montelukast (SINGULAIR) 10 MG tablet Take 10 mg by mouth at bedtime as needed.      nitroGLYCERIN (NITROSTAT) 0.4 MG SL tablet Place 1 tablet (0.4 mg total) under the tongue every 5 (five) minutes as needed for chest pain. 25 tablet 3   Polyvinyl Alcohol-Povidone (REFRESH OP) Place 1 drop into both eyes 3 (three) times daily as needed (for dry eyes).      ticagrelor (BRILINTA) 60 MG TABS tablet Take 1 tablet (60 mg total) by mouth 2 (two) times daily. 180 tablet 3   No current facility-administered medications on file prior to visit.    Cardiovascular studies:  EKG 12/16/2020: Sinus rhythm 78 bpm First degree A-V block Sinus pause with ectopic atrial beat Left atrial enlargement Old anteroseptal infarct  Event monitor 12/07/2018-01/05/2019: Baseline rhythm sinus.  Heart rate 27-149 bpm.  Average heart rate 62 bpm. 373 episodes of sinus pauses >3 seconds.  Longest pause 10 seconds, intermittent first-degree AV block, all during sleep hours, not symptomatic. No definite evidence  of atrial fibrillation. Rate with possible junctional rhythm during sleep likely related to sleep disordered breathing. One episode of RVR likely atrial tachycardia.  No symptoms reported.    Discussed above findings with the patient.  Previously, I have started him on Xarelto, milligrams daily for possible atrial fibrillation noted on event monitor.  On detailed review of end of report, I do not believe.  Thus, I have asked him to stop Xarelto 20 mg daily, and asked to resume Brilinta at low-dose 60 mg twice daily for longer duration given his recurrent MI history.  He should start this on January 20, 2019.  Again, given his lack of symptoms with long sinus pauses during sleep, pacemaker is not indicated.   Cath 12/04/2017: LM: Normal 100% ostial LAD occlusion, successful PPI with Resolute Onyx DES 3.0 X 38 mm Moderate nonobstructive disease in RCA and LCx.  Echocardiogram 01/29/2018: Left ventricle cavity is normal in size. Accurate LVEF estimation limited to poor endocardial visualization. Visual EF is 35-40%. Normal diastolic filling pattern. Left ventricle regional wall motion findings: Mid to distal LAD moderate hypokinesis.  Consider MRI for more accurate EF estimation.  Calculated EF 40%. Mild tricuspid regurgitation.  Compared to hospital echocardiogram dated 12/05/2017, LVEF is marginally improved.  Cardiac MRI 03/15/2018: Normal LV size. Mild basal septal hypertrophy. LGE: 50-75% in basal anteroseptal wall.          75-100% mid anteroseptal, anterior, apical septal amd anterior walls with poor chance of recovery.  Normal RV function. Mild MR, mild TR.  Normal sized great vessels  Recent labs: 12/14/2020: Glucose 87, BUN/Cr 19/1.04. Na/K 137/4.5. Chol 100, TG 63, HDL 51, LDL 35  12/12/2019: Glucose 86, BUN/Cr 17/1.02. EGFR 77. Na/K 140/4.8. Rest of the CMP normal H/H 14/41. MCV 95. Platelets 209 Chol 103, TG 107, HDL 47, LDL 36  03/2019: TSH 1.6   12/06/2017: H/H 14/42.  MCV 92.  Patient was 212 Glucose 85.  BUN/creatinine 11/0.90.  EGFR normal.  Sodium 134, potassium 4.1. Peak troponin > 65 Cholesterol 183, HDL 31 and LDL 109, triglycerides 215   Review of Systems  Cardiovascular:  Positive for syncope. Negative for chest pain, dyspnea on exertion, leg swelling and palpitations.        Vitals:   12/16/20 0929  BP: 140/86  Pulse: 80  Resp: 17  Temp: 97.8 F (36.6 C)  SpO2: 97%     Objective:    Physical Exam Vitals and nursing note reviewed.  Constitutional:      General: He is not in acute distress. Neck:     Vascular: No JVD.   Cardiovascular:     Rate and Rhythm: Normal rate and regular rhythm.     Heart sounds: Normal heart sounds. No murmur heard. Pulmonary:     Effort: Pulmonary effort is normal.     Breath sounds: Normal breath sounds. No wheezing or rales.  Musculoskeletal:     Right lower leg: No edema.     Left lower leg: No edema.          Assessment & Recommendations:   65 y/o Caucasian male with hypertension, hyperlipidemia, CAD s/p prior LAD BMS 2011, anterior STEMI on 12/04/2017, s/p PPCI to ostial LAD, ischemic cardiomyopathy with recovered EF, h/o sinus pauses, OSA-noncompliant with CPAP, now with unexplained syncope (07/2020)  Syncope: Episode during physical activity in 07/2020.  No recurrence since then. Patient has had a longstanding history of otherwise asymptomatic sinus pauses.  He does have OSA and admits to  be noncompliant.  I urged him to be compliant with his CPAP. In addition, I will obtain echocardiogram to assess his EF.  While he has ischemic cardiomyopathy, his EF had recovered to 45% on cardiac MRI in 02/2018, this did not have ICD placed after 3 months of wearable defibrillator. In absence of any recurrent symptoms and known history of sinus pauses, I do not think that repeat cardiac monitor would help.  Also, loop recorder may limit anatomical space for pacemaker, should he need one. Given patient's unexplained syncope, and longstanding history of sinus pauses and possibility of sinus node dysfunction, I will refer him back to EP for further evaluation.  Ischemic cardiomyopathy: Preserved LVEF in spite of LAD infarct (Cardiac MRI 02/2018 EF 45%). While LGE is a known risk factor for arrhtymias, given improvment in LVEF and no clinical evidence of arrhtymias, there was no indication for ICD anymore.  Nonetheless, we will repeat echocardiogram now given his recent syncope. LVEF improvement after STEMI portends good prognosis. Allergic to lisinopril, thus unable to use  Entresto. Losartan stopped by PCP due to side effects of nighttime sweats. Not on beta blocker due to sinus pauses He is euvolemic, and no heart failure, at this point.  I will reassess EF.  Unless <30%, I am okay holding of the above medications, as described.  CAD: Coronary artery disease s/p ostial LAD PCI for anterior wall MI in 11/2017. Continue Aspirin 81 mg daily. Okay to stop Brilinta. Continue Repatha given his statin tolerance. LDL 35 (11/2020).  Rest of the maangement as above.  OSA: Emphazied compliance.  F/u in 3 months  Salineno North, MD [facility] Pager: [phone] Office: [phone] If no answer Cell [phone]

## 2020-12-24 ENCOUNTER — Ambulatory Visit: Payer: BC Managed Care – PPO

## 2020-12-24 ENCOUNTER — Other Ambulatory Visit: Payer: Self-pay

## 2020-12-24 DIAGNOSIS — I2510 Atherosclerotic heart disease of native coronary artery without angina pectoris: Secondary | ICD-10-CM | POA: Diagnosis not present

## 2021-01-12 ENCOUNTER — Other Ambulatory Visit: Payer: Self-pay

## 2021-01-12 ENCOUNTER — Encounter: Payer: Self-pay | Admitting: Internal Medicine

## 2021-01-12 ENCOUNTER — Ambulatory Visit: Payer: BC Managed Care – PPO | Admitting: Internal Medicine

## 2021-01-12 VITALS — BP 136/72 | HR 62 | Ht 69.0 in | Wt 172.4 lb

## 2021-01-12 DIAGNOSIS — I255 Ischemic cardiomyopathy: Secondary | ICD-10-CM | POA: Diagnosis not present

## 2021-01-12 DIAGNOSIS — I455 Other specified heart block: Secondary | ICD-10-CM | POA: Diagnosis not present

## 2021-01-12 DIAGNOSIS — R55 Syncope and collapse: Secondary | ICD-10-CM

## 2021-01-12 LAB — HEMOGLOBIN AND HEMATOCRIT, BLOOD
Hematocrit: 42 % (ref 37.5–51.0)
Hemoglobin: 14.6 g/dL (ref 13.0–17.7)

## 2021-01-12 NOTE — Progress Notes (Signed)
HPI Michael Terrell is referred today by Dr. Virgina Jock for evaluation of syncope and to consider ICD insertion. He is a pleasant 65 yo man with a h/o CAD s/p MI twice. He has undergone primary PCI. He has not been able to tolerate a beta blocker due to symptomatic bradycardia and losartan resulted in symptomatic low bp. He has had variable EF's with most recent EF by cath of 25% but other EF determinations were higher in the 35-40% range. He describes a sudden episode of syncope. No warning.  Allergies  Allergen Reactions   Lisinopril Rash   Metoprolol Other (See Comments)    Significant bradycardia     Current Outpatient Medications  Medication Sig Dispense Refill   aspirin 81 MG tablet Take 81 mg by mouth daily.      Azelastine HCl 0.15 % SOLN Inhale 1 spray into the lungs daily.     cetirizine (ZYRTEC) 10 MG tablet Take 10 mg by mouth daily as needed for allergies.      Cholecalciferol (VITAMIN D-3 PO) Take 2,500 Units by mouth daily.     Evolocumab 140 MG/ML SOAJ Inject 140 mg into the skin every 14 (fourteen) days.     famotidine (PEPCID) 20 MG tablet Take 20 mg by mouth daily.     ferrous sulfate 325 (65 FE) MG EC tablet Take 325 mg by mouth 3 (three) times a week.     levothyroxine (SYNTHROID, LEVOTHROID) 75 MCG tablet Take 75 mcg by mouth daily before breakfast.     montelukast (SINGULAIR) 10 MG tablet Take 10 mg by mouth at bedtime as needed.      nitroGLYCERIN (NITROSTAT) 0.4 MG SL tablet Place 1 tablet (0.4 mg total) under the tongue every 5 (five) minutes as needed for chest pain. 25 tablet 3   Polyvinyl Alcohol-Povidone (REFRESH OP) Place 1 drop into both eyes 3 (three) times daily as needed (for dry eyes).      BRILINTA 60 MG TABS tablet TAKE 1 TABLET BY MOUTH 2 TIMES DAILY (Patient not taking: Reported on 01/12/2021) 180 tablet 3   No current facility-administered medications for this visit.     Past Medical History:  Diagnosis Date   Acute anterior wall MI  ([place]) 10/2009   CAD (coronary artery disease)    BMS to LAD (2011)   Chest pain    Dyslipidemia    Heart attack ([place]) 11/2017   Hyperlipidemia    Hypertension    Sinus pause    Snoring     ROS:   All systems reviewed and negative except as noted in the HPI.   Past Surgical History:  Procedure Laterality Date   CARDIAC CATHETERIZATION  01/29/2010   patent LAD stent (3.5x23mm Vision - Dr. Corky Downs 11/03/2009); 50% prox PLA stenosis, 50% distal RCA stenosis (Dr. Norlene Duel)   CORONARY ANGIOPLASTY WITH STENT PLACEMENT  11/13/2009   3.5x14mm Vision BMS to LAD (Dr. Corky Downs)   CORONARY/GRAFT ACUTE MI REVASCULARIZATION N/A 12/04/2017   Procedure: Coronary/Graft Acute MI Revascularization;  Surgeon: Troy Sine, MD;  Location: [facility];  Service: Cardiovascular;  Laterality: N/A;   KNEE SURGERY     LEFT HEART CATH AND CORONARY ANGIOGRAPHY N/A 12/04/2017   Procedure: LEFT HEART CATH AND CORONARY ANGIOGRAPHY;  Surgeon: Troy Sine, MD;  Location: [facility];  Service: Cardiovascular;  Laterality: N/A;   NM MYOCAR PERF WALL MOTION  02/2010   bruce myoview - normal pattern of  perfusion, low risk, no ischemia demonstrated, low risk   SHOULDER SURGERY     TRANSTHORACIC ECHOCARDIOGRAM  02/2012   EF 78-29%, grade 1 diastolic dysfunction; mild MR; LA mildly dilated; upper limit borderline LA enlargement     Family History  Problem Relation Age of Onset   CAD Father        MI in his 50s   Heart disease Brother    Heart disease Brother      Social History   Socioeconomic History   Marital status: Married    Spouse name: Not on file   Number of children: 2   Years of education: Not on file   Highest education level: Master's degree (e.g., MA, MS, MEng, MEd, MSW, MBA)  Occupational History   Occupation: Nurse, 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[employer]    Employer: [employer]  Tobacco Use   Smoking status: Never   Smokeless tobacco: Never  Vaping Use   Vaping  Use: Never used  Substance and Sexual Activity   Alcohol use: No    Alcohol/week: 0.0 standard drinks   Drug use: No   Sexual activity: Not on file  Other Topics Concern   Not on file  Social History Narrative   Not on file   Social Determinants of Health   Financial Resource Strain: Not on file  Food Insecurity: Not on file  Transportation Needs: Not on file  Physical Activity: Not on file  Stress: Not on file  Social Connections: Not on file  Intimate Partner Violence: Not on file     BP 136/72   Pulse 62   Ht 5\' 9"  (1.753 m)   Wt 172 lb 6.4 oz (78.2 kg)   SpO2 99%   BMI 25.46 kg/m   Physical Exam:  Well appearing NAD HEENT: Unremarkable Neck:  6 cm JVD, no thyromegally Lymphatics:  No adenopathy Back:  No CVA tenderness Lungs:  Clear with no wheezes HEART:  Regular rate rhythm, no murmurs, no rubs, no clicks Abd:  soft, positive bowel sounds, no organomegally, no rebound, no guarding Ext:  2 plus pulses, no edema, no cyanosis, no clubbing Skin:  No rashes no nodules Neuro:  CN II through XII intact, motor grossly intact  EKG - nsr with as mi   Assess/Plan:  ICM - his actual EF is unclear. He denies anginal symptoms. He will undergo cardiac MRI scanning and he will undergo ICD if EF is less than or equal to 56% Chronic systolic heart failure -he is limited by his low bp. Hopefully if his ICD goes in and a atrial lead is placed he will be able to take a beta blocker. Syncope - the etiology is unclear. I plan to perform an EP study if his EF is above 35%.  [name] [name],MD

## 2021-01-12 NOTE — Patient Instructions (Addendum)
Medication Instructions:  Your physician recommends that you continue on your current medications as directed. Please refer to the Current Medication list given to you today.  Labwork: You will get lab work today:  H&H  Testing/Procedures: Your physician has requested that you have a cardiac MRI. Cardiac MRI uses a computer to create images of your heart as its beating, producing both still and moving pictures of your heart and major blood vessels.   Follow-Up: Your physician wants you to follow-up based on results of your cardiac MRI  Any Other Special Instructions Will Be Listed Below (If Applicable).  If you need a refill on your cardiac medications before your next appointment, please call your pharmacy.   You are scheduled for Cardiac MRI on _______________. Please arrive at the [facility] main entrance of [facility] at ___________ (30-45 minutes prior to test start time). ?  [facility]  [address]  [place], [place] [address]  [phone]  Proceed to the [facility] Radiology Department (First Floor).  ?  Magnetic resonance imaging (MRI) is a painless test that produces images of the inside of the body without using X-rays. During an MRI, strong magnets and radio waves work together in a [employer], political party to form detailed images. MRI images may provide more details about a medical condition than X-rays, CT scans, and ultrasounds can provide.   You may be given earphones to listen for instructions.   You may eat a light breakfast and take medications as ordered.  If a contrast material will be used, an IV will be inserted into one of your veins. Contrast material will be injected into your IV.   You will be asked to remove all metal, including: Watch, jewelry, and other metal objects including hearing aids, hair pieces and dentures. (Braces and fillings normally are not a problem.) If contrast material was used:  It will leave your body through your  urine within a day. You may be told to drink plenty of fluids to help flush the contrast material out of your system.  TEST WILL TAKE APPROXIMATELY 1 HOUR  PLEASE NOTIFY SCHEDULING AT LEAST 24 HOURS IN ADVANCE IF YOU ARE UNABLE TO KEEP YOUR APPOINTMENT.

## 2021-02-08 ENCOUNTER — Telehealth (HOSPITAL_COMMUNITY): Payer: Self-pay | Admitting: *Deleted

## 2021-02-08 NOTE — Telephone Encounter (Signed)
Reaching out to patient to offer assistance regarding upcoming cardiac imaging study; pt verbalizes understanding of appt date/time, parking situation and where to check in, and verified current allergies.  Gordy Clement RN Navigator Cardiac Imaging [facility] [phone] office [phone] cell  Patient denies metal or claustrophobia.

## 2021-02-09 ENCOUNTER — Ambulatory Visit (HOSPITAL_COMMUNITY)
Admission: RE | Admit: 2021-02-09 | Discharge: 2021-02-09 | Disposition: A | Discharge status: Home / Self Care | Payer: BC Managed Care – PPO | Source: Ambulatory Visit | Attending: Internal Medicine | Admitting: Internal Medicine

## 2021-02-09 ENCOUNTER — Other Ambulatory Visit: Payer: Self-pay

## 2021-02-09 DIAGNOSIS — I455 Other specified heart block: Secondary | ICD-10-CM

## 2021-02-09 DIAGNOSIS — R55 Syncope and collapse: Secondary | ICD-10-CM | POA: Insufficient documentation

## 2021-02-09 DIAGNOSIS — I255 Ischemic cardiomyopathy: Secondary | ICD-10-CM

## 2021-02-09 MED ORDER — GADOBUTROL 1 MMOL/ML IV SOLN
8.0000 mL | Freq: Once | INTRAVENOUS | Status: AC | PRN
  Administered 2021-02-09: 8 mL via INTRAVENOUS

## 2021-02-19 ENCOUNTER — Telehealth: Payer: Self-pay

## 2021-02-19 DIAGNOSIS — I255 Ischemic cardiomyopathy: Secondary | ICD-10-CM

## 2021-02-19 NOTE — Telephone Encounter (Signed)
Attempted to call Pt.  Call rang, but no answer and no voicemail.  Sent Michael Terrell.

## 2021-02-19 NOTE — Telephone Encounter (Signed)
-----   Message from Evans Lance, MD sent at 02/17/2021  9:05 PM EST ----- EF is 35%. Schedule DDD ICD with me. GT

## 2021-02-23 NOTE — Telephone Encounter (Signed)
Pt scheduled for ICD implant on March 15, 2021  Labs March 04, 2021  Work up complete

## 2021-03-04 ENCOUNTER — Other Ambulatory Visit: Payer: Self-pay

## 2021-03-04 ENCOUNTER — Other Ambulatory Visit: Payer: BC Managed Care – PPO | Admitting: *Deleted

## 2021-03-04 DIAGNOSIS — I255 Ischemic cardiomyopathy: Secondary | ICD-10-CM | POA: Diagnosis not present

## 2021-03-04 LAB — BASIC METABOLIC PANEL
BUN/Creatinine Ratio: 16 (ref 10–24)
BUN: 15 mg/dL (ref 8–27)
CO2: 25 mmol/L (ref 20–29)
Calcium: 9.4 mg/dL (ref 8.6–10.2)
Chloride: 101 mmol/L (ref 96–106)
Creatinine, Ser: 0.96 mg/dL (ref 0.76–1.27)
Glucose: 100 mg/dL — ABNORMAL HIGH (ref 70–99)
Potassium: 4.3 mmol/L (ref 3.5–5.2)
Sodium: 139 mmol/L (ref 134–144)
eGFR: 87 mL/min/{1.73_m2} (ref >59)

## 2021-03-04 LAB — CBC WITH DIFFERENTIAL/PLATELET
Basophils Absolute: 0.1 10*3/uL (ref 0.0–0.2)
Basos: 1 %
EOS (ABSOLUTE): 0.2 10*3/uL (ref 0.0–0.4)
Eos: 4 %
Hematocrit: 43.9 % (ref 37.5–51.0)
Hemoglobin: 15.3 g/dL (ref 13.0–17.7)
Immature Grans (Abs): 0 10*3/uL (ref 0.0–0.1)
Immature Granulocytes: 0 %
Lymphocytes Absolute: 1.3 10*3/uL (ref 0.7–3.1)
Lymphs: 24 %
MCH: 32.7 pg (ref 26.6–33.0)
MCHC: 34.9 g/dL (ref 31.5–35.7)
MCV: 94 fL (ref 79–97)
Monocytes Absolute: 0.6 10*3/uL (ref 0.1–0.9)
Monocytes: 10 %
Neutrophils Absolute: 3.3 10*3/uL (ref 1.4–7.0)
Neutrophils: 61 %
Platelets: 195 10*3/uL (ref 150–450)
RBC: 4.68 x10E6/uL (ref 4.14–5.80)
RDW: 12.6 % (ref 11.6–15.4)
WBC: 5.4 10*3/uL (ref 3.4–10.8)

## 2021-03-08 DIAGNOSIS — Z23 Encounter for immunization: Secondary | ICD-10-CM | POA: Diagnosis not present

## 2021-03-12 NOTE — Pre-Procedure Instructions (Signed)
Instructed patient on the following items: Arrival time 0930 Nothing to eat or drink after midnight No meds AM of procedure Responsible person to drive you home and stay with you for 24 hrs Wash with special soap night before and morning of procedure  

## 2021-03-15 ENCOUNTER — Encounter (HOSPITAL_COMMUNITY)
Admission: RE | Disposition: A | Discharge status: Home / Self Care | Payer: Self-pay | Source: Home / Self Care | Attending: Internal Medicine

## 2021-03-15 ENCOUNTER — Ambulatory Visit (HOSPITAL_COMMUNITY): Payer: BC Managed Care – PPO

## 2021-03-15 ENCOUNTER — Ambulatory Visit (HOSPITAL_COMMUNITY)
Admission: RE | Admit: 2021-03-15 | Discharge: 2021-03-15 | Disposition: A | Discharge status: Home / Self Care | Payer: BC Managed Care – PPO | Attending: Internal Medicine | Admitting: Internal Medicine

## 2021-03-15 DIAGNOSIS — R55 Syncope and collapse: Secondary | ICD-10-CM | POA: Diagnosis not present

## 2021-03-15 DIAGNOSIS — I5022 Chronic systolic (congestive) heart failure: Secondary | ICD-10-CM

## 2021-03-15 DIAGNOSIS — I110 Hypertensive heart disease with heart failure: Secondary | ICD-10-CM | POA: Insufficient documentation

## 2021-03-15 DIAGNOSIS — I2510 Atherosclerotic heart disease of native coronary artery without angina pectoris: Secondary | ICD-10-CM | POA: Insufficient documentation

## 2021-03-15 DIAGNOSIS — I252 Old myocardial infarction: Secondary | ICD-10-CM | POA: Diagnosis not present

## 2021-03-15 DIAGNOSIS — Z95810 Presence of automatic (implantable) cardiac defibrillator: Secondary | ICD-10-CM

## 2021-03-15 DIAGNOSIS — I255 Ischemic cardiomyopathy: Secondary | ICD-10-CM | POA: Insufficient documentation

## 2021-03-15 HISTORY — PX: ICD IMPLANT: EP1208

## 2021-03-15 SURGERY — ICD IMPLANT

## 2021-03-15 MED ORDER — SODIUM CHLORIDE 0.9 % IV SOLN: INTRAVENOUS | Status: DC

## 2021-03-15 MED ORDER — POVIDONE-IODINE 10 % EX SWAB
2.0000 "application " | Freq: Once | CUTANEOUS | Status: AC
  Administered 2021-03-15: 2 via TOPICAL

## 2021-03-15 MED ORDER — MIDAZOLAM HCL 5 MG/5ML IJ SOLN
INTRAMUSCULAR | Status: DC | PRN
  Administered 2021-03-15 (×2): 1 mg via INTRAVENOUS
  Administered 2021-03-15: 2 mg via INTRAVENOUS

## 2021-03-15 MED ORDER — CEFAZOLIN SODIUM-DEXTROSE 2-4 GM/100ML-% IV SOLN
2.0000 g | INTRAVENOUS | Status: AC
  Administered 2021-03-15: 2 g via INTRAVENOUS

## 2021-03-15 MED ORDER — MIDAZOLAM HCL 5 MG/5ML IJ SOLN
INTRAMUSCULAR | Status: AC
  Filled 2021-03-15: qty 5

## 2021-03-15 MED ORDER — LIDOCAINE HCL (PF) 1 % IJ SOLN
INTRAMUSCULAR | Status: DC | PRN
  Administered 2021-03-15: 60 mL

## 2021-03-15 MED ORDER — HEPARIN (PORCINE) IN NACL 1000-0.9 UT/500ML-% IV SOLN
INTRAVENOUS | Status: DC | PRN
  Administered 2021-03-15: 500 mL

## 2021-03-15 MED ORDER — SODIUM CHLORIDE 0.9 % IV SOLN
INTRAVENOUS | Status: AC
  Filled 2021-03-15: qty 2

## 2021-03-15 MED ORDER — CEFAZOLIN SODIUM-DEXTROSE 2-4 GM/100ML-% IV SOLN
INTRAVENOUS | Status: AC
  Filled 2021-03-15: qty 100

## 2021-03-15 MED ORDER — FENTANYL CITRATE (PF) 100 MCG/2ML IJ SOLN
INTRAMUSCULAR | Status: AC
  Filled 2021-03-15: qty 2

## 2021-03-15 MED ORDER — CEFAZOLIN SODIUM-DEXTROSE 1-4 GM/50ML-% IV SOLN
1.0000 g | Freq: Once | INTRAVENOUS | Status: AC
  Administered 2021-03-15: 1 g via INTRAVENOUS
  Filled 2021-03-15 (×2): qty 50

## 2021-03-15 MED ORDER — CHLORHEXIDINE GLUCONATE 4 % EX LIQD: 4.0000 "application " | Freq: Once | CUTANEOUS | Status: DC

## 2021-03-15 MED ORDER — HEPARIN (PORCINE) IN NACL 1000-0.9 UT/500ML-% IV SOLN
INTRAVENOUS | Status: AC
  Filled 2021-03-15: qty 500

## 2021-03-15 MED ORDER — ONDANSETRON HCL 4 MG/2ML IJ SOLN: 4.0000 mg | Freq: Four times a day (QID) | INTRAMUSCULAR | Status: DC | PRN

## 2021-03-15 MED ORDER — LIDOCAINE HCL (PF) 1 % IJ SOLN
INTRAMUSCULAR | Status: AC
  Filled 2021-03-15: qty 90

## 2021-03-15 MED ORDER — IOHEXOL 350 MG/ML SOLN
INTRAVENOUS | Status: DC | PRN
  Administered 2021-03-15: 10 mL via INTRAVENOUS

## 2021-03-15 MED ORDER — SODIUM CHLORIDE 0.9 % IV SOLN
80.0000 mg | INTRAVENOUS | Status: AC
  Administered 2021-03-15: 80 mg

## 2021-03-15 MED ORDER — ACETAMINOPHEN 325 MG PO TABS: 325.0000 mg | ORAL_TABLET | ORAL | Status: DC | PRN

## 2021-03-15 MED ORDER — FENTANYL CITRATE (PF) 100 MCG/2ML IJ SOLN
INTRAMUSCULAR | Status: DC | PRN
  Administered 2021-03-15 (×3): 25 ug via INTRAVENOUS

## 2021-03-15 SURGICAL SUPPLY — 6 items
CABLE SURGICAL S-101-97-12 (CABLE) ×3 IMPLANT
ICD GALLANT VR CDVRA500Q (ICD Generator) ×1 IMPLANT
LEAD DURATA 7122Q-65CM (Lead) ×1 IMPLANT
PAD DEFIB RADIO PHYSIO CONN (PAD) ×3 IMPLANT
SHEATH 7FR PRELUDE SNAP 13 (SHEATH) ×1 IMPLANT
TRAY PACEMAKER INSERTION (PACKS) ×3 IMPLANT

## 2021-03-15 NOTE — H&P (Signed)
HPI Michael Terrell is referred today by Dr. Virgina Jock for evaluation of syncope and to consider ICD insertion. He is a pleasant 66 yo man with a h/o CAD s/p MI twice. He has undergone primary PCI. He has not been able to tolerate a beta blocker due to symptomatic bradycardia and losartan resulted in symptomatic low bp. He has had variable EF's with most recent EF by cath of 25% but other EF determinations were higher in the 35-40% range. He describes a sudden episode of syncope. No warning.       Allergies  Allergen Reactions   Lisinopril Rash   Metoprolol Other (See Comments)      Significant bradycardia              Current Outpatient Medications  Medication Sig Dispense Refill   aspirin 81 MG tablet Take 81 mg by mouth daily.        Azelastine HCl 0.15 % SOLN Inhale 1 spray into the lungs daily.       cetirizine (ZYRTEC) 10 MG tablet Take 10 mg by mouth daily as needed for allergies.        Cholecalciferol (VITAMIN D-3 PO) Take 2,500 Units by mouth daily.       Evolocumab 140 MG/ML SOAJ Inject 140 mg into the skin every 14 (fourteen) days.       famotidine (PEPCID) 20 MG tablet Take 20 mg by mouth daily.       ferrous sulfate 325 (65 FE) MG EC tablet Take 325 mg by mouth 3 (three) times a week.       levothyroxine (SYNTHROID, LEVOTHROID) 75 MCG tablet Take 75 mcg by mouth daily before breakfast.       montelukast (SINGULAIR) 10 MG tablet Take 10 mg by mouth at bedtime as needed.        nitroGLYCERIN (NITROSTAT) 0.4 MG SL tablet Place 1 tablet (0.4 mg total) under the tongue every 5 (five) minutes as needed for chest pain. 25 tablet 3   Polyvinyl Alcohol-Povidone (REFRESH OP) Place 1 drop into both eyes 3 (three) times daily as needed (for dry eyes).        BRILINTA 60 MG TABS tablet TAKE 1 TABLET BY MOUTH 2 TIMES DAILY (Patient not taking: Reported on 01/12/2021) 180 tablet 3    No current facility-administered medications for this visit.            Past Medical  History:  Diagnosis Date   Acute anterior wall MI ([place]) 10/2009   CAD (coronary artery disease)      BMS to LAD (2011)   Chest pain     Dyslipidemia     Heart attack ([place]) 11/2017   Hyperlipidemia     Hypertension     Sinus pause     Snoring        ROS:    All systems reviewed and negative except as noted in the HPI.          Past Surgical History:  Procedure Laterality Date   CARDIAC CATHETERIZATION   01/29/2010    patent LAD stent (3.5x21mm Vision - Dr. Corky Downs 11/03/2009); 50% prox PLA stenosis, 50% distal RCA stenosis (Dr. Norlene Duel)   CORONARY ANGIOPLASTY WITH STENT PLACEMENT   11/13/2009    3.5x46mm Vision BMS to LAD (Dr. Corky Downs)   CORONARY/GRAFT ACUTE MI REVASCULARIZATION N/A 12/04/2017    Procedure: Coronary/Graft Acute MI Revascularization;  Surgeon: Troy Sine, MD;  Location: [facility];  Service: Cardiovascular;  Laterality: N/A;   KNEE SURGERY       LEFT HEART CATH AND CORONARY ANGIOGRAPHY N/A 12/04/2017    Procedure: LEFT HEART CATH AND CORONARY ANGIOGRAPHY;  Surgeon: Troy Sine, MD;  Location: [facility];  Service: Cardiovascular;  Laterality: N/A;   NM MYOCAR PERF WALL MOTION   02/2010    bruce myoview - normal pattern of perfusion, low risk, no ischemia demonstrated, low risk   SHOULDER SURGERY       TRANSTHORACIC ECHOCARDIOGRAM   02/2012    EF 53-29%, grade 1 diastolic dysfunction; mild MR; LA mildly dilated; upper limit borderline LA enlargement             Family History  Problem Relation Age of Onset   CAD Father          MI in his 36s   Heart disease Brother     Heart disease Brother          Social History         Socioeconomic History   Marital status: Married      Spouse name: Not on file   Number of children: 2   Years of education: Not on file   Highest education level: Master's degree (e.g., MA, MS, MEng, MEd, MSW, MBA)  Occupational History   Occupation: Nurse, 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[employer]      Employer:  [employer]  Tobacco Use   Smoking status: Never   Smokeless tobacco: Never  Vaping Use   Vaping Use: Never used  Substance and Sexual Activity   Alcohol use: No      Alcohol/week: 0.0 standard drinks   Drug use: No   Sexual activity: Not on file  Other Topics Concern   Not on file  Social History Narrative   Not on file    Social Determinants of Health    Financial Resource Strain: Not on file  Food Insecurity: Not on file  Transportation Needs: Not on file  Physical Activity: Not on file  Stress: Not on file  Social Connections: Not on file  Intimate Partner Violence: Not on file        BP 136/72    Pulse 62    Ht 5\' 9"  (1.753 m)    Wt 172 lb 6.4 oz (78.2 kg)    SpO2 99%    BMI 25.46 kg/m    Physical Exam:   Well appearing NAD HEENT: Unremarkable Neck:  6 cm JVD, no thyromegally Lymphatics:  No adenopathy Back:  No CVA tenderness Lungs:  Clear with no wheezes HEART:  Regular rate rhythm, no murmurs, no rubs, no clicks Abd:  soft, positive bowel sounds, no organomegally, no rebound, no guarding Ext:  2 plus pulses, no edema, no cyanosis, no clubbing Skin:  No rashes no nodules Neuro:  CN II through XII intact, motor grossly intact   EKG - nsr with as mi     Assess/Plan:  ICM - his actual EF is unclear. He denies anginal symptoms. He will undergo cardiac MRI scanning and he will undergo ICD if EF is less than or equal to 92% Chronic systolic heart failure -he is limited by his low bp. Hopefully if his ICD goes in and a atrial lead is placed he will be able to take a beta blocker. Syncope - the etiology is unclear. I plan to perform an EP study if his EF is above 35%.   [name] Juline Sanderford,MD  EP Addendum  The patient's EF is 35 % by cardiac MRI. We will proceed with ICD insertion.  [name] Kendra Grissett,MD

## 2021-03-15 NOTE — Discharge Instructions (Addendum)
Supplemental Discharge Instructions for  Pacemaker/Defibrillator Patients  Tomorrow, 03/16/21, send in a device transmission  Activity No heavy lifting or vigorous activity with your left/right arm for 6 to 8 weeks.  Do not raise your left/right arm above your head for one week.  Gradually raise your affected arm as drawn below.            03/20/21                       03/21/21                   03/22/21                   03/23/21 __  NO DRIVING for  1 week   ; you may begin driving on  6/64/40  .  WOUND CARE Keep the wound area clean and dry.  Do not get this area wet , no showers for one week; you may shower on  03/23/21   . Tomorrow, 03/16/21, remove the arm sling Tomorrow, 03/16/21 remove the LARGE outer plastic bandage.  Underneath the plastic bandage there are steri strips (paper tapes), DO NOT remove these. The tape/steri-strips on your wound will fall off; do not pull them off.  No bandage is needed on the site.  DO  NOT apply any creams, oils, or ointments to the wound area. If you notice any drainage or discharge from the wound, any swelling or bruising at the site, or you develop a fever > 101? F after you are discharged home, call the office at once.  Special Instructions You are still able to use cellular telephones; use the ear opposite the side where you have your pacemaker/defibrillator.  Avoid carrying your cellular phone near your device. When traveling through airports, show security personnel your identification card to avoid being screened in the metal detectors.  Ask the security personnel to use the hand wand. Avoid arc welding equipment, MRI testing (magnetic resonance imaging), TENS units (transcutaneous nerve stimulators).  Call the office for questions about other devices. Avoid electrical appliances that are in poor condition or are not properly grounded. Microwave ovens are safe to be near or to operate.  Additional information for defibrillator patients should  your device go off: If your device goes off ONCE and you feel fine afterward, notify the device clinic nurses. If your device goes off ONCE and you do not feel well afterward, call 911. If your device goes off TWICE, call 911. If your device goes off THREE times in one day, call 911.  DO NOT DRIVE YOURSELF OR A FAMILY MEMBER WITH A DEFIBRILLATOR TO THE HOSPITAL--CALL 911.  After Your ICD (Implantable Cardiac Defibrillator)   You have a [employer] ICD  ACTIVITY Do not lift your arm above shoulder height for 1 week after your procedure. After 7 days, you may progress as below.  You should remove your sling 24 hours after your procedure, unless otherwise instructed by your provider.     Monday March 22, 2021  Tuesday March 23, 2021 Wednesday March 24, 2021 Thursday March 25, 2021   Do not lift, push, pull, or carry anything over 10 pounds with the affected arm until 6 weeks (Monday April 26, 2021 ) after your procedure.   You may drive AFTER your wound check, unless you have been told otherwise by your provider.   Ask your healthcare provider when you can go back to work  INCISION/Dressing If you are on a blood thinner such as Coumadin, Xarelto, Eliquis, Plavix, or Pradaxa please confirm with your provider when this should be resumed.   If large square, outer bandage is left in place, this can be removed after 24 hours from your procedure. Do not remove steri-strips or glue as below.   Monitor your defibrillator site for redness, swelling, and drainage. Call the device clinic at [phone] if you experience these symptoms or fever/chills.  If your incision is sealed with Steri-strips or staples, you may shower 10 days after your procedure or when told by your provider. Do not remove the steri-strips or let the shower hit directly on your site. You may wash around your site with soap and water.    If you were discharged in a sling, please do not wear this during the day  more than 48 hours after your surgery unless otherwise instructed. This may increase the risk of stiffness and soreness in your shoulder.   Avoid lotions, ointments, or perfumes over your incision until it is well-healed.  You may use a hot tub or a pool AFTER your wound check appointment if the incision is completely closed.  Your ICD is designed to protect you from life threatening heart rhythms. Because of this, you may receive a shock.   1 shock with no symptoms:  Call the office during business hours. 1 shock with symptoms (chest pain, chest pressure, dizziness, lightheadedness, shortness of breath, overall feeling unwell):  Call 911. If you experience 2 or more shocks in 24 hours:  Call 911. If you receive a shock, you should not drive for 6 months per the [place] DMV IF you receive appropriate therapy from your ICD.   ICD Alerts:  Some alerts are vibratory and others beep. These are NOT emergencies. Please call our office to let us know. If this occurs at night or on weekends, it can wait until the next business day. Send a remote transmission.  If your device is capable of reading fluid status (for heart failure), you will be offered monthly monitoring to review this with you.   DEVICE MANAGEMENT Remote monitoring is used to monitor your ICD from home. This monitoring is scheduled every 91 days by our office. It allows [place] to keep an eye on the functioning of your device to ensure it is working properly. You will routinely see your Electrophysiologist annually (more often if necessary).   You should receive your ID card for your new device in 4-8 weeks. Keep this card with you at all times once received. Consider wearing a medical alert bracelet or necklace.  Your ICD  may be MRI compatible. This will be discussed at your next office visit/wound check.  You should avoid contact with strong electric or magnetic fields.   Do not use amateur (ham) radio equipment or electric (arc) welding  torches. MP3 player headphones with magnets should not be used. Some devices are safe to use if held at least 12 inches (30 cm) from your defibrillator. These include power tools, lawn mowers, and speakers. If you are unsure if something is safe to use, ask your health care provider.  When using your cell phone, hold it to the ear that is on the opposite side from the defibrillator. Do not leave your cell phone in a pocket over the defibrillator.  You may safely use electric blankets, heating pads, computers, and microwave ovens.  Call the office right away if: You have chest pain. You feel  more than one shock. You feel more short of breath than you have felt before. You feel more light-headed than you have felt before. Your incision starts to open up.  This information is not intended to replace advice given to you by your health care provider. Make sure you discuss any questions you have with your health care provider.

## 2021-03-15 NOTE — H&P (Signed)
HPI [name] is referred today by Dr. Virgina Jock for evaluation of syncope and to consider ICD insertion. He is a pleasant 66 yo man with a h/o CAD s/p MI twice. He has undergone primary PCI. He has not been able to tolerate a beta blocker due to symptomatic bradycardia and losartan resulted in symptomatic low bp. He has had variable EF's with most recent EF by cath of 25% but other EF determinations were higher in the 35-40% range. He describes a sudden episode of syncope. No warning.       Allergies  Allergen Reactions   Lisinopril Rash   Metoprolol Other (See Comments)      Significant bradycardia              Current Outpatient Medications  Medication Sig Dispense Refill   aspirin 81 MG tablet Take 81 mg by mouth daily.        Azelastine HCl 0.15 % SOLN Inhale 1 spray into the lungs daily.       cetirizine (ZYRTEC) 10 MG tablet Take 10 mg by mouth daily as needed for allergies.        Cholecalciferol (VITAMIN D-3 PO) Take 2,500 Units by mouth daily.       Evolocumab 140 MG/ML SOAJ Inject 140 mg into the skin every 14 (fourteen) days.       famotidine (PEPCID) 20 MG tablet Take 20 mg by mouth daily.       ferrous sulfate 325 (65 FE) MG EC tablet Take 325 mg by mouth 3 (three) times a week.       levothyroxine (SYNTHROID, LEVOTHROID) 75 MCG tablet Take 75 mcg by mouth daily before breakfast.       montelukast (SINGULAIR) 10 MG tablet Take 10 mg by mouth at bedtime as needed.        nitroGLYCERIN (NITROSTAT) 0.4 MG SL tablet Place 1 tablet (0.4 mg total) under the tongue every 5 (five) minutes as needed for chest pain. 25 tablet 3   Polyvinyl Alcohol-Povidone (REFRESH OP) Place 1 drop into both eyes 3 (three) times daily as needed (for dry eyes).        BRILINTA 60 MG TABS tablet TAKE 1 TABLET BY MOUTH 2 TIMES DAILY (Patient not taking: Reported on 01/12/2021) 180 tablet 3    No current facility-administered medications for this visit.            Past Medical  History:  Diagnosis Date   Acute anterior wall MI ([place]) 10/2009   CAD (coronary artery disease)      BMS to LAD (2011)   Chest pain     Dyslipidemia     Heart attack ([place]) 11/2017   Hyperlipidemia     Hypertension     Sinus pause     Snoring        ROS:    All systems reviewed and negative except as noted in the HPI.          Past Surgical History:  Procedure Laterality Date   CARDIAC CATHETERIZATION   01/29/2010    patent LAD stent (3.5x56mm Vision - Dr. Corky Downs 11/03/2009); 50% prox PLA stenosis, 50% distal RCA stenosis (Dr. Norlene Duel)   CORONARY ANGIOPLASTY WITH STENT PLACEMENT   11/13/2009    3.5x19mm Vision BMS to LAD (Dr. Corky Downs)   CORONARY/GRAFT ACUTE MI REVASCULARIZATION N/A 12/04/2017    Procedure: Coronary/Graft Acute MI Revascularization;  Surgeon: Troy Sine, MD;  Location: [facility]  INVASIVE CV LAB;  Service: Cardiovascular;  Laterality: N/A;   KNEE SURGERY       LEFT HEART CATH AND CORONARY ANGIOGRAPHY N/A 12/04/2017    Procedure: LEFT HEART CATH AND CORONARY ANGIOGRAPHY;  Surgeon: Troy Sine, MD;  Location: [facility] CV LAB;  Service: Cardiovascular;  Laterality: N/A;   NM MYOCAR PERF WALL MOTION   02/2010    bruce myoview - normal pattern of perfusion, low risk, no ischemia demonstrated, low risk   SHOULDER SURGERY       TRANSTHORACIC ECHOCARDIOGRAM   02/2012    EF 16-10%, grade 1 diastolic dysfunction; mild MR; LA mildly dilated; upper limit borderline LA enlargement             Family History  Problem Relation Age of Onset   CAD Father          MI in his 84s   Heart disease Brother     Heart disease Brother          Social History         Socioeconomic History   Marital status: Married      Spouse name: Not on file   Number of children: 2   Years of education: Not on file   Highest education level: Master's degree (e.g., MA, MS, MEng, MEd, MSW, MBA)  Occupational History   Occupation: Nurse, 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[employer]      Employer:  [employer]  Tobacco Use   Smoking status: Never   Smokeless tobacco: Never  Vaping Use   Vaping Use: Never used  Substance and Sexual Activity   Alcohol use: No      Alcohol/week: 0.0 standard drinks   Drug use: No   Sexual activity: Not on file  Other Topics Concern   Not on file  Social History Narrative   Not on file    Social Determinants of Health    Financial Resource Strain: Not on file  Food Insecurity: Not on file  Transportation Needs: Not on file  Physical Activity: Not on file  Stress: Not on file  Social Connections: Not on file  Intimate Partner Violence: Not on file        BP 136/72    Pulse 62    Ht 5\' 9"  (1.753 m)    Wt 172 lb 6.4 oz (78.2 kg)    SpO2 99%    BMI 25.46 kg/m    Physical Exam:   Well appearing NAD HEENT: Unremarkable Neck:  6 cm JVD, no thyromegally Lymphatics:  No adenopathy Back:  No CVA tenderness Lungs:  Clear with no wheezes HEART:  Regular rate rhythm, no murmurs, no rubs, no clicks Abd:  soft, positive bowel sounds, no organomegally, no rebound, no guarding Ext:  2 plus pulses, no edema, no cyanosis, no clubbing Skin:  No rashes no nodules Neuro:  CN II through XII intact, motor grossly intact   EKG - nsr with as mi     Assess/Plan:  ICM - his actual EF is unclear. He denies anginal symptoms. He will undergo cardiac MRI scanning and he will undergo ICD if EF is less than or equal to 96% Chronic systolic heart failure -he is limited by his low bp. Hopefully if his ICD goes in and a atrial lead is placed he will be able to take a beta blocker. Syncope - the etiology is unclear. I plan to perform an EP study if his EF is above 35%.   Michael Overlie Finnean Cerami,MD  EP Attending  Patient seen and examined. Agree with above. His EF is 35% by CMR and I have recommended we proceed with ICD Insertion and he wishes to proceed.  Michael Overlie Michael Bognar,MD

## 2021-03-16 ENCOUNTER — Telehealth: Payer: Self-pay

## 2021-03-16 ENCOUNTER — Encounter (HOSPITAL_COMMUNITY): Payer: Self-pay | Admitting: Internal Medicine

## 2021-03-16 NOTE — Telephone Encounter (Signed)
-----   Message from [place], [place] sent at 03/16/2021  6:58 AM EST ----- Same day d/c yesterday  [employer]ott ICD  GT

## 2021-03-16 NOTE — Telephone Encounter (Signed)
LVM for patient to call device clinic back regarding DC instruction s/p ICD implant. Direct number to DC given.

## 2021-03-16 NOTE — Telephone Encounter (Signed)
Patient returned phone call  Follow-up after same day discharge: Implant date: 03/15/21 MD: Cristopher Peru, MD Device: SJM ICD Location: Left pectoral   Wound check visit: 03/25/21 90 day MD follow-up: 06/18/21  Remote Transmission received: Merlin comm on 03/16/21  Dressing removed: Yes, reviewed s/s of infection to monitor for and report.   Also reviewed activity restrictions.    Paceart and Merlin updated with device info, future remote checks scheduled.

## 2021-03-18 ENCOUNTER — Ambulatory Visit: Payer: BC Managed Care – PPO | Admitting: Cardiology

## 2021-03-25 ENCOUNTER — Other Ambulatory Visit: Payer: Self-pay

## 2021-03-25 ENCOUNTER — Ambulatory Visit (INDEPENDENT_AMBULATORY_CARE_PROVIDER_SITE_OTHER): Payer: BC Managed Care – PPO

## 2021-03-25 DIAGNOSIS — I255 Ischemic cardiomyopathy: Secondary | ICD-10-CM | POA: Diagnosis not present

## 2021-03-25 LAB — CUP PACEART INCLINIC DEVICE CHECK
Battery Remaining Longevity: 122 mo
Brady Statistic RV Percent Paced: 2.1 %
Date Time Interrogation Session: 20230126130850
HighPow Impedance: 66.375
Implantable Lead Implant Date: 20230116
Implantable Lead Location: 753860
Implantable Pulse Generator Implant Date: 20230116
Lead Channel Impedance Value: 587.5 Ohm
Lead Channel Pacing Threshold Amplitude: 0.75 V
Lead Channel Pacing Threshold Pulse Width: 0.5 ms
Lead Channel Sensing Intrinsic Amplitude: 11.8 mV
Lead Channel Setting Pacing Amplitude: 3.5 V
Lead Channel Setting Pacing Pulse Width: 0.5 ms
Lead Channel Setting Sensing Sensitivity: 0.5 mV
Pulse Gen Serial Number: 111046020

## 2021-03-25 NOTE — Patient Instructions (Signed)
   After Your ICD (Implantable Cardiac Defibrillator)    Monitor your defibrillator site for redness, swelling, and drainage. Call the device clinic at [phone] if you experience these symptoms or fever/chills.  Your incision was closed with Steri-strips or staples:  You may shower 7 days after your procedure and wash your incision with soap and water. Avoid lotions, ointments, or perfumes over your incision until it is well-healed.    You may use a hot tub or a pool after your wound check appointment if the incision is completely closed.  Do not lift, push or pull greater than 10 pounds with the affected arm until 6 weeks after your procedure. There are no other restrictions in arm movement after your wound check appointment.  Your ICD is MRI compatible.  Your ICD is designed to protect you from life threatening heart rhythms. Because of this, you may receive a shock.   1 shock with no symptoms:  Call the office during business hours. 1 shock with symptoms (chest pain, chest pressure, dizziness, lightheadedness, shortness of breath, overall feeling unwell):  Call 911. If you experience 2 or more shocks in 24 hours:  Call 911. If you receive a shock, you should not drive.  [place] DMV - no driving for 6 months if you receive appropriate therapy from your ICD.   ICD Alerts:  Some alerts are vibratory and others beep. These are NOT emergencies. Please call our office to let us know. If this occurs at night or on weekends, it can wait until the next business day. Send a remote transmission.  If your device is capable of reading fluid status (for heart failure), you will be offered monthly monitoring to review this with you.   Remote monitoring is used to monitor your ICD from home. This monitoring is scheduled every 91 days by our office. It allows us to keep an eye on the functioning of your device to ensure it is working properly. You will routinely see your Electrophysiologist annually  (more often if necessary).   

## 2021-03-25 NOTE — Progress Notes (Signed)
Wound check appointment. Steri-strips removed. Wound without redness or edema. Incision edges approximated, wound well healed. Normal device function. Thresholds, sensing, and impedances consistent with implant measurements. Device programmed at 3.5V for extra safety margin until 3 month visit. Histogram distribution appropriate for patient and level of activity. No ventricular arrhythmias noted. Patient educated about wound care, arm mobility, lifting restrictions, shock plan. Patietn is enrolled in remote monitoring, next scheduled check 06/14/21.  ROV with Dr. Lovena Le on 06/18/21.

## 2021-04-02 ENCOUNTER — Telehealth: Payer: Self-pay

## 2021-04-02 DIAGNOSIS — I429 Cardiomyopathy, unspecified: Secondary | ICD-10-CM | POA: Diagnosis not present

## 2021-04-02 DIAGNOSIS — I10 Essential (primary) hypertension: Secondary | ICD-10-CM | POA: Diagnosis not present

## 2021-04-02 DIAGNOSIS — I509 Heart failure, unspecified: Secondary | ICD-10-CM | POA: Diagnosis not present

## 2021-04-02 DIAGNOSIS — U071 COVID-19: Secondary | ICD-10-CM | POA: Diagnosis not present

## 2021-04-02 NOTE — Telephone Encounter (Signed)
Okay to take. Hold statin for the period of COVID treatment.  Thanks MJP

## 2021-04-07 DIAGNOSIS — I10 Essential (primary) hypertension: Secondary | ICD-10-CM | POA: Diagnosis not present

## 2021-04-07 DIAGNOSIS — J309 Allergic rhinitis, unspecified: Secondary | ICD-10-CM | POA: Diagnosis not present

## 2021-04-07 DIAGNOSIS — K219 Gastro-esophageal reflux disease without esophagitis: Secondary | ICD-10-CM | POA: Diagnosis not present

## 2021-04-07 DIAGNOSIS — U071 COVID-19: Secondary | ICD-10-CM | POA: Diagnosis not present

## 2021-04-16 ENCOUNTER — Telehealth (HOSPITAL_COMMUNITY): Payer: Self-pay | Admitting: Vascular Surgery

## 2021-04-16 NOTE — Telephone Encounter (Signed)
Lvm giving new chf appt 3/20 @ 11:40 W/ DB, Asked pt to call back to confirm appt/ new pt packet will be sent

## 2021-05-06 ENCOUNTER — Other Ambulatory Visit: Payer: Self-pay | Admitting: Gastroenterology

## 2021-05-06 DIAGNOSIS — Z1211 Encounter for screening for malignant neoplasm of colon: Secondary | ICD-10-CM | POA: Diagnosis not present

## 2021-05-06 DIAGNOSIS — Z86010 Personal history of colonic polyps: Secondary | ICD-10-CM | POA: Diagnosis not present

## 2021-05-06 DIAGNOSIS — K219 Gastro-esophageal reflux disease without esophagitis: Secondary | ICD-10-CM | POA: Diagnosis not present

## 2021-05-06 DIAGNOSIS — K5730 Diverticulosis of large intestine without perforation or abscess without bleeding: Secondary | ICD-10-CM | POA: Diagnosis not present

## 2021-05-16 NOTE — Progress Notes (Signed)
- ? ?  ADVANCED HF CLINIC CONSULT NOTE ? ?Referring Physician: Dr. Ernie Hew ?Primary Care: Fanny Bien, MD ?Primary Cardiologist: Dr. Virgina Jock ? ?HPI: ? ?66 y/o male with CAD s/p anterior STEMI (10/19), HTN and OSA referrd by Dr. Virgina Jock for further management of systolic HF. ? ?Had PCI/BMS to LAD in 2011. In 10/19 had anterior stemi with PCI/DES to ostial LAD. Moderate non-obstructive CAD in LCX and RCA ? ?Echo 10/19 EF 25-30% ?Echo 12/19 EF 35-40% ?cMRI 1/20: EF 45% Large LAD infarct ?cMRI 12/22: EF 35% Subendocardial LGE > 50% in entire LAD territory RV 47% ?  ?Event monitor 10/20:  373 episodes of sinus pauses >3 seconds.  Longest pause 10 seconds, intermittent first-degree AV block, all during sleep hours, not symptomatic. ? ?Had syncopal episode 6/22 while running. Eventually underwent ICD placement in 1/23 with Dr. Lovena Le ? ?Here for second opinion on his cardiomyopathy. Walks 50 mins with his dogs every day. Seems like it is a bit harder over the past year. Does not have to stop on hills. No CP, orthopnea or PND. No edema. Compliant with meds but has not been on any GDMT recently.  ? ?Previously stopped losartan due to night sweats. Had rash with lisinopril. Has never been on spiro or SGLT2i. Personally reviewed ?

## 2021-05-17 ENCOUNTER — Other Ambulatory Visit (HOSPITAL_COMMUNITY): Payer: Self-pay

## 2021-05-17 ENCOUNTER — Other Ambulatory Visit (HOSPITAL_COMMUNITY): Payer: Self-pay | Admitting: *Deleted

## 2021-05-17 ENCOUNTER — Other Ambulatory Visit: Payer: Self-pay

## 2021-05-17 ENCOUNTER — Ambulatory Visit (HOSPITAL_COMMUNITY)
Admission: RE | Admit: 2021-05-17 | Discharge: 2021-05-17 | Disposition: A | Discharge status: Home / Self Care | Payer: BC Managed Care – PPO | Source: Ambulatory Visit | Attending: Internal Medicine | Admitting: Internal Medicine

## 2021-05-17 ENCOUNTER — Encounter (HOSPITAL_COMMUNITY): Payer: Self-pay | Admitting: Internal Medicine

## 2021-05-17 VITALS — BP 140/80 | HR 54 | Wt 173.4 lb

## 2021-05-17 DIAGNOSIS — Z95810 Presence of automatic (implantable) cardiac defibrillator: Secondary | ICD-10-CM | POA: Diagnosis not present

## 2021-05-17 DIAGNOSIS — G4733 Obstructive sleep apnea (adult) (pediatric): Secondary | ICD-10-CM | POA: Diagnosis not present

## 2021-05-17 DIAGNOSIS — I455 Other specified heart block: Secondary | ICD-10-CM

## 2021-05-17 DIAGNOSIS — I110 Hypertensive heart disease with heart failure: Secondary | ICD-10-CM | POA: Insufficient documentation

## 2021-05-17 DIAGNOSIS — I255 Ischemic cardiomyopathy: Secondary | ICD-10-CM

## 2021-05-17 DIAGNOSIS — I5022 Chronic systolic (congestive) heart failure: Secondary | ICD-10-CM | POA: Diagnosis not present

## 2021-05-17 DIAGNOSIS — I252 Old myocardial infarction: Secondary | ICD-10-CM | POA: Diagnosis not present

## 2021-05-17 DIAGNOSIS — I2510 Atherosclerotic heart disease of native coronary artery without angina pectoris: Secondary | ICD-10-CM | POA: Insufficient documentation

## 2021-05-17 DIAGNOSIS — Z955 Presence of coronary angioplasty implant and graft: Secondary | ICD-10-CM | POA: Diagnosis not present

## 2021-05-17 MED ORDER — DAPAGLIFLOZIN PROPANEDIOL 10 MG PO TABS: 10.0000 mg | ORAL_TABLET | Freq: Every day | ORAL | 6 refills | Status: DC

## 2021-05-17 NOTE — Patient Instructions (Signed)
Start Farxiga 10 mg Daily ? ?Your physician has recommended that you have a cardiopulmonary stress test (CPX). CPX testing is a non-invasive measurement of heart and lung function. It replaces a traditional treadmill stress test. This type of test provides a tremendous amount of information that relates not only to your present condition but also for future outcomes. This test combines measurements of you ventilation, respiratory gas exchange in the lungs, electrocardiogram (EKG), blood pressure and physical response before, during, and following an exercise protocol. ? ?Please follow up with our heart failure pharmacist in 3-4 weeks ? ?Your physician recommends that you schedule a follow-up appointment in: 4 months, **PLEASE CALL OUR OFFICE IN MAY TO SCHEDULE THIS APPOINTMENT ? ?If you have any questions or concerns before your next appointment please send [place] a message through [place] or call our office at [phone].   ? ?TO LEAVE A MESSAGE FOR THE NURSE SELECT OPTION 2, PLEASE LEAVE A MESSAGE INCLUDING: ?YOUR NAME ?DATE OF BIRTH ?CALL BACK NUMBER ?REASON FOR CALL**this is important as we prioritize the call backs ? ?YOU WILL RECEIVE A CALL BACK THE SAME DAY AS LONG AS YOU CALL BEFORE 4:00 PM ? ?At the [facility], you and your health needs are our priority. As part of our continuing mission to provide you with exceptional heart care, we have created designated Provider Care Teams. These Care Teams include your primary Cardiologist (physician) and Advanced Practice Providers (APPs- Physician Assistants and Nurse Practitioners) who all work together to provide you with the care you need, when you need it.  ? ?You may see any of the following providers on your designated Care Team at your next follow up: ?Dr Glori Bickers ?Dr Loralie Champagne ?Darrick Grinder, NP ?Lyda Jester, PA ?Jessica Milford,NP ?Marlyce Huge, PA ?Audry Riles, PharmD ? ? ?Please be sure to bring in all your medications  bottles to every appointment.  ? ? ? ?

## 2021-05-19 ENCOUNTER — Other Ambulatory Visit: Payer: Self-pay

## 2021-05-19 ENCOUNTER — Ambulatory Visit (HOSPITAL_COMMUNITY): Payer: BC Managed Care – PPO | Attending: Family Medicine

## 2021-05-19 ENCOUNTER — Other Ambulatory Visit (HOSPITAL_COMMUNITY): Payer: Self-pay | Admitting: *Deleted

## 2021-05-19 DIAGNOSIS — I5022 Chronic systolic (congestive) heart failure: Secondary | ICD-10-CM | POA: Diagnosis not present

## 2021-05-19 DIAGNOSIS — I255 Ischemic cardiomyopathy: Secondary | ICD-10-CM | POA: Insufficient documentation

## 2021-05-24 DIAGNOSIS — E039 Hypothyroidism, unspecified: Secondary | ICD-10-CM | POA: Diagnosis not present

## 2021-05-25 ENCOUNTER — Telehealth (HOSPITAL_COMMUNITY): Payer: Self-pay

## 2021-05-25 NOTE — Telephone Encounter (Signed)
Received a fax requesting medical records from [facility], [place]. Records were successfully faxed to: [phone] ,which was the number provided.. Medical request form will be scanned into patients chart.  ? ?Patient was called and gave a verbal order ok to send medical records, [place], RMA ?

## 2021-05-26 ENCOUNTER — Other Ambulatory Visit: Payer: Self-pay

## 2021-05-26 ENCOUNTER — Encounter: Payer: Self-pay | Admitting: Cardiology

## 2021-05-26 ENCOUNTER — Ambulatory Visit: Payer: BC Managed Care – PPO | Admitting: Cardiology

## 2021-05-26 VITALS — BP 147/88 | HR 60 | Temp 98.0°F | Resp 16 | Ht 69.0 in | Wt 174.0 lb

## 2021-05-26 DIAGNOSIS — I2510 Atherosclerotic heart disease of native coronary artery without angina pectoris: Secondary | ICD-10-CM

## 2021-05-26 DIAGNOSIS — I5022 Chronic systolic (congestive) heart failure: Secondary | ICD-10-CM

## 2021-05-26 DIAGNOSIS — I255 Ischemic cardiomyopathy: Secondary | ICD-10-CM | POA: Diagnosis not present

## 2021-05-26 MED ORDER — LOSARTAN POTASSIUM 25 MG PO TABS: 25.0000 mg | ORAL_TABLET | Freq: Every day | ORAL | 3 refills | Status: DC

## 2021-05-26 NOTE — Progress Notes (Signed)
? ? ?Subjective:  ? ?Michael Terrell, male    DOB: [birth year], 66 y.o.   MRN: [id number] ? ?Chief complaint:  ?Coronary artery disease ? ? ?HPI ? ?66 y/o Caucasian male with hypertension, hyperlipidemia, CAD s/p prior LAD BMS 2011, anterior STEMI on 12/04/2017, s/p PPCI to ostial LAD, ischemic cardiomyopathy with recovered EF, St Jude s/p ICD (02/2021), h/o sinus pauses, OSA-noncompliant with CPAP. ? ?Patient denies chest pain, dyspnea is only minimal with more than usual activity. He underwent cardiac MRI in 01/2021 that showed EF 35%. Subsequently, he underwent primary prevention ICD by Dr. Lovena Le in 02/2021. Later, he was referred by Dr. Ernie Hew to Dr. Haroldine Laws for second opinion for his cardiomyopathy. CPEX details below. Wilder Glade was added.  ? ? ?Current Outpatient Medications:  ?  aspirin EC 81 MG tablet, Take 81 mg by mouth daily., Disp: , Rfl:  ?  azelastine (ASTELIN) 0.1 % nasal spray, Place 2 sprays into both nostrils 2 (two) times daily., Disp: , Rfl:  ?  cetirizine (ZYRTEC) 10 MG tablet, Take 10 mg by mouth daily., Disp: , Rfl:  ?  Cholecalciferol (VITAMIN D-3 PO), Take 2,500 Units by mouth daily., Disp: , Rfl:  ?  dapagliflozin propanediol (FARXIGA) 10 MG TABS tablet, Take 1 tablet (10 mg total) by mouth daily before breakfast., Disp: 30 tablet, Rfl: 6 ?  Evolocumab (REPATHA SURECLICK) 270 MG/ML SOAJ, Inject 1 mL as directed every 14 (fourteen) days., Disp: , Rfl:  ?  famotidine (PEPCID) 40 MG tablet, Take 40 mg by mouth daily., Disp: , Rfl:  ?  levothyroxine (SYNTHROID, LEVOTHROID) 75 MCG tablet, Take 75 mcg by mouth daily before breakfast., Disp: , Rfl:  ?  Loratadine 10 MG CAPS, Take 10 mg by mouth as needed., Disp: , Rfl:  ?  montelukast (SINGULAIR) 10 MG tablet, Take 10 mg by mouth as needed., Disp: , Rfl:  ?  nitroGLYCERIN (NITROSTAT) 0.4 MG SL tablet, Place 1 tablet (0.4 mg total) under the tongue every 5 (five) minutes as needed for chest pain., Disp: 25 tablet, Rfl: 3 ?  omeprazole (PRILOSEC) 40  MG capsule, Take 40 mg by mouth 2 (two) times daily as needed., Disp: , Rfl:  ?  Polyvinyl Alcohol-Povidone (REFRESH OP), Place 1 drop into both eyes 3 (three) times daily as needed (for dry eyes). , Disp: , Rfl:  ? ?Cardiovascular studies: ? ?CPEX 05/19/2021: ?Exercise testing with gas exchange demonstrates normal(excellent) functional capacity when compared to matched sedentary norms. Markedly elevated VE/VCO2 slope suggests at least a mild HF limitation with mild to moderate hyperventilation likely driving the VE/VCO2 slope even further. Patient was achieving ventilatory limits but also demonstrates at least a mild limitation due to HF, even considering excellent PVO2.  ? ?St Jude ICD placement (Dr. Lovena Le) 03/15/2021 ? ?CMRI 02/09/2022: ?1. Subendocardial late gadolinium enhancement consistent with prior ?infarct in LV basal anteroseptal, mid anteroseptal/anterior, apical ?septal/anterior walls, and apex. LGE is greater than 50% transmural ?suggesting nonviability in this territory. ?  ?2.  Mild LV dilatation with moderate systolic dysfunction (EF 35%) ?  ?3.  Normal RV size and low normal systolic function (EF 00%) ?  ? ?EKG 12/16/2020: ?Sinus rhythm 78 bpm ?First degree A-V block ?Sinus pause with ectopic atrial beat ?Left atrial enlargement ?Old anteroseptal infarct ? ?Event monitor 12/07/2018-01/05/2019: ?Baseline rhythm sinus.  Heart rate 27-149 bpm.  Average heart rate 62 bpm. ?373 episodes of sinus pauses >3 seconds.  Longest pause 10 seconds, intermittent first-degree AV block, all during sleep hours,  not symptomatic. ?No definite evidence of atrial fibrillation. Rate with possible junctional rhythm during sleep likely related to sleep disordered breathing. ?One episode of RVR likely atrial tachycardia.  ?No symptoms reported.  ?  ?Discussed above findings with the patient.  Previously, I have started him on Xarelto, milligrams daily for possible atrial fibrillation noted on event monitor.  On detailed  review of end of report, I do not believe.  Thus, I have asked him to stop Xarelto 20 mg daily, and asked to resume Brilinta at low-dose 60 mg twice daily for longer duration given his recurrent MI history.  He should start this on January 20, 2019.  Again, given his lack of symptoms with long sinus pauses during sleep, pacemaker is not indicated. ?  ?Cath 12/04/2017: ?LM: Normal ?100% ostial LAD occlusion, successful PPI with Resolute Onyx DES 3.0 X 38 mm ?Moderate nonobstructive disease in RCA and LCx. ? ?Echocardiogram 01/29/2018: ?Left ventricle cavity is normal in size. Accurate LVEF estimation limited to poor endocardial visualization. Visual EF is 35-40%. Normal diastolic filling pattern. Left ventricle regional wall motion findings: Mid to distal LAD moderate hypokinesis.  Consider MRI for more accurate EF estimation.  Calculated EF 40%. ?Mild tricuspid regurgitation.  ?Compared to hospital echocardiogram dated 12/05/2017, LVEF is marginally improved. ? ?Cardiac MRI 03/15/2018: ?Normal LV size. Mild basal septal hypertrophy. ?LGE: 50-75% in basal anteroseptal wall. ?         75-100% mid anteroseptal, anterior, apical septal amd anterior walls with poor chance of recovery.  ?Normal RV function. ?Mild MR, mild TR.  ?Normal sized great vessels ? ?Recent labs: ?03/04/2021: ?Glucose 100, BUN/Cr 15/0.96. EGFR 87. Na/K 139/4.3. Rest of the CMP normal ?H/H 15/43. MCV 94. Platelets 195 ? ? ?Review of Systems  ?Cardiovascular:  Positive for syncope. Negative for chest pain, dyspnea on exertion, leg swelling and palpitations.  ? ?   ? ? ?Vitals:  ? 05/26/21 1036  ?BP: (!) 147/88  ?Pulse: 60  ?Resp: 16  ?Temp: 98 ?F (36.7 ?C)  ?SpO2: 98%  ? ? ? ?Objective:  ?  ?Physical Exam ?Vitals and nursing note reviewed.  ?Constitutional:   ?   General: He is not in acute distress. ?Neck:  ?   Vascular: No JVD.  ?Cardiovascular:  ?   Rate and Rhythm: Normal rate and regular rhythm.  ?   Heart sounds: Normal heart sounds. No murmur  heard. ?Pulmonary:  ?   Effort: Pulmonary effort is normal.  ?   Breath sounds: Normal breath sounds. No wheezing or rales.  ?Musculoskeletal:  ?   Right lower leg: No edema.  ?   Left lower leg: No edema.  ? ? ?  ICD-10-CM   ?1. Ischemic cardiomyopathy  I25.5   ?  ?2. Chronic systolic heart failure (HCC)  I50.22   ?  ?3. Coronary artery disease involving native coronary artery of native heart without angina pectoris  I25.10   ?  ? ?Orders Placed This Encounter  ?Procedures  ? Basic metabolic panel  ? ?Meds ordered this encounter  ?Medications  ? losartan (COZAAR) 25 MG tablet  ?  Sig: Take 1 tablet (25 mg total) by mouth daily.  ?  Dispense:  30 tablet  ?  Refill:  3  ? ? ? ? ?   ?Assessment & Recommendations:  ? ? ?66 y/o Caucasian male with hypertension, hyperlipidemia, CAD s/p prior LAD BMS 2011, anterior STEMI on 12/04/2017, s/p PPCI to ostial LAD, ischemic cardiomyopathy with recovered EF, St  Jude s/p ICD (02/2021), h/o sinus pauses, OSA-noncompliant with CPAP ? ?Syncope: ?No recurrence ? ?Ischemic cardiomyopathy: ?EF 35% (CMRI 01/2021). ?LAD infarct with no viability. ?NYHA class II. CPX with at least mild HF component. ?Now s/p St Jude ICD (02/2021) ?Allergic to lisinopril, thus unable to use Entresto. ?Not on beta blocker due to sinus pauses ?Now on Farxiga 10 mg. ?Losartan stopped by PCP due to side effects of nighttime sweats. He is willing to try this again. ?Started losartan 25 mg daily. Check BMP in 1 week. ? ?In spite of having had HFrEF for > 3 years, he has done fairly well clinically and remains NYHA II at worst. He was not on GDMT due to above reasons. Now that his EF is known to be lower than before, I agree with resuming GDMT as above. I hope today's discussion helped his anxiety following ICD placement, albeit without any shocks, and seeing heart failure specialist, albeit for the right reasons.  ? ?CAD: ?Coronary artery disease s/p ostial LAD PCI for anterior wall MI in 11/2017. ?Continue  Aspirin 81 mg daily.  ?Continue Repatha given his statin tolerance. LDL 35 (11/2020).  ?Rest of the maangement as above. ? ?OSA: ?Emphazied compliance. ? ?F/u in 3 months ? ?Nigel Mormon, MD ?[employer]

## 2021-05-27 DIAGNOSIS — E039 Hypothyroidism, unspecified: Secondary | ICD-10-CM | POA: Diagnosis not present

## 2021-05-27 DIAGNOSIS — E782 Mixed hyperlipidemia: Secondary | ICD-10-CM | POA: Diagnosis not present

## 2021-05-27 DIAGNOSIS — K219 Gastro-esophageal reflux disease without esophagitis: Secondary | ICD-10-CM | POA: Diagnosis not present

## 2021-05-27 DIAGNOSIS — I509 Heart failure, unspecified: Secondary | ICD-10-CM | POA: Diagnosis not present

## 2021-06-10 LAB — BASIC METABOLIC PANEL
BUN/Creatinine Ratio: 15 (ref 10–24)
BUN: 17 mg/dL (ref 8–27)
CO2: 23 mmol/L (ref 20–29)
Calcium: 9.5 mg/dL (ref 8.6–10.2)
Chloride: 102 mmol/L (ref 96–106)
Creatinine, Ser: 1.11 mg/dL (ref 0.76–1.27)
Glucose: 100 mg/dL — ABNORMAL HIGH (ref 70–99)
Potassium: 4.6 mmol/L (ref 3.5–5.2)
Sodium: 140 mmol/L (ref 134–144)
eGFR: 73 mL/min/{1.73_m2} (ref >59)

## 2021-06-11 NOTE — Progress Notes (Incomplete)
***In Progress*** ? ?  ?Advanced Heart Failure Clinic Note  ? ?Referring Physician: Dr. Ernie Hew ?Primary Care: Fanny Bien, MD ?Primary Cardiologist: Dr. Virgina Jock ? ?HPI:  ?66 y/o male with CAD s/p anterior STEMI (11/2017), HTN and OSA referrd by Dr. Virgina Jock for further management of systolic HF. ?  ?Had PCI/BMS to LAD in 2011. In 11/2017 had anterior STEMI with PCI/DES to ostial LAD. Moderate non-obstructive CAD in LCX and RCA. ?  ?Echo 11/2017 EF 25-30% ?Echo 01/2018 EF 35-40% ?cMRI 02/2018: EF 45% Large LAD infarct ?cMRI 01/2021: EF 35% Subendocardial LGE > 50% in entire LAD territory RV 47% ?  ?Event monitor 11/2018: 373 episodes of sinus pauses >3 seconds.  Longest pause 10 seconds, intermittent first-degree AV block, all during sleep hours, not symptomatic. ?  ?Had syncopal episode 07/2020 while running. Eventually underwent ICD placement in 02/2021 with Dr. Lovena Le ?  ?Presented 04/2021 for a second opinion on his cardiomyopathy. He was walking 50 mins with his dogs every day and it seemed that it is has gotten a bit harder over the past year. He noted that he does not have to stop on hills. No CP, orthopnea or PND. No edema. Compliant with meds but has not been on any GDMT recently. He previously stopped losartan due to night sweats. Had rash with lisinopril. Had never been on spiro or SGLT2i. ? ?Today he returns to HF clinic for pharmacist medication titration. At last visit with MD Wilder Glade 10 mg daily was added. He subsequently had follow-up with his primary cardiologist and losartan 25 mg daily was retrialed.  ? ?Overall feeling ***. ?Dizziness, lightheadedness, fatigue:  ?Chest pain or palpitations: ? ?How is your breathing?: *** ?SOB: ?Able to complete all ADLs. Activity level *** ? ?Weight at home pounds. Takes furosemide/torsemide/bumex *** mg *** daily.  ?LEE ?PND/Orthopnea ? ?Appetite *** ?Low-salt diet:  ? ?Physical Exam ?Cost/affordability of meds  ? ?HF Medications: ?Losartan 25 mg  daily ?Farxiga 10 mg daily ? ?Has the patient been experiencing any side effects to the medications prescribed?  {YES NO:22349} ? ?Does the patient have any problems obtaining medications due to transportation or finances?   {YES NO:22349} ? ?Understanding of regimen: {excellent/good/fair/poor:19665} ?Understanding of indications: {excellent/good/fair/poor:19665} ?Potential of compliance: {excellent/good/fair/poor:19665} ?Patient understands to avoid NSAIDs. ?Patient understands to avoid decongestants. ?  ? ?Pertinent Lab Values: ?Labs 06/09/2021: Serum creatinine 1.11, BUN 17, Potassium 4.6, Sodium 140 ? ?Vital Signs: ?Weight: *** (last clinic weight: 173.4 lbs)  ?Blood pressure: *** 140/80, 147/88 ?Heart rate: *** 54, 60 ? ?Plan ?BMET today to ensure Scr/K stable ?No BB as don't want RV pacing and HR low already ?Scr and K up with losartan added at 1 week bmet - 20% increase, ok ?A. switch losartan to entresto 24/26 mg bid - f/u bmet 1-2 weeks and f/u pharmacy apt then (1 of 3-4 med apts) ?BArlyce Harman 12.5 mg daily - bmet 1 week, watch K, SCr ? ?Assessment/Plan: ?1. Systolic HF due to iCM ?- s/p anterior STEMI in 10/19 with PCI to ostial LAD  ?- Echo 01/2018 EF 35-40% ?- cMRI 02/2018: EF 45% Large LAD infarct ?- cMRI 01/2021: EF 35% Subendocardial LGE > 50% in entire LAD territory RV 47% ?- s/p ICD 02/2021. ICD interrogated in clinic No VT. ~ 4% RV pacing. Activity level 3-4hr/day  ?- NYHA II ?- Volume status *** ?-Continue losartan 25 mg daily *** ?- Continue Farxiga 10  ?- Will need to be careful with b-blocker given underlying conduction disease as  we do not want to force RV pacing ?  ?2. CAD s/p previous anterior STEMI ?- Had PCI/BMS to LAD in 2011. In 11/2017 had anterior stemi with PCI/DES to ostial LAD. Moderate non-obstructive CAD in LCX and RCA ?- followed by Dr. Virgina Jock ?- intolerant of statins ?- Continue ASA/PCSK-91 ?- no s/s angina ?  ?3. OSA ?- intolerant of CPAP ? ?Follow up *** ? ? ?Audry Riles,  PharmD, BCPS, BCCP, CPP ?Heart Failure Clinic Pharmacist ?[phone] ?  ?

## 2021-06-14 ENCOUNTER — Ambulatory Visit (INDEPENDENT_AMBULATORY_CARE_PROVIDER_SITE_OTHER): Payer: BC Managed Care – PPO

## 2021-06-14 DIAGNOSIS — I255 Ischemic cardiomyopathy: Secondary | ICD-10-CM

## 2021-06-15 ENCOUNTER — Ambulatory Visit (HOSPITAL_COMMUNITY)
Admission: RE | Admit: 2021-06-15 | Discharge: 2021-06-15 | Disposition: A | Discharge status: Home / Self Care | Payer: Managed Care, Other (non HMO) | Source: Ambulatory Visit | Attending: Cardiology | Admitting: Cardiology

## 2021-06-15 ENCOUNTER — Other Ambulatory Visit (HOSPITAL_COMMUNITY): Payer: Self-pay

## 2021-06-15 VITALS — BP 150/92 | HR 61 | Wt 172.8 lb

## 2021-06-15 DIAGNOSIS — I2510 Atherosclerotic heart disease of native coronary artery without angina pectoris: Secondary | ICD-10-CM | POA: Insufficient documentation

## 2021-06-15 DIAGNOSIS — I255 Ischemic cardiomyopathy: Secondary | ICD-10-CM | POA: Insufficient documentation

## 2021-06-15 DIAGNOSIS — G4733 Obstructive sleep apnea (adult) (pediatric): Secondary | ICD-10-CM | POA: Insufficient documentation

## 2021-06-15 DIAGNOSIS — I5022 Chronic systolic (congestive) heart failure: Secondary | ICD-10-CM | POA: Diagnosis present

## 2021-06-15 DIAGNOSIS — Z7982 Long term (current) use of aspirin: Secondary | ICD-10-CM | POA: Diagnosis not present

## 2021-06-15 DIAGNOSIS — I252 Old myocardial infarction: Secondary | ICD-10-CM | POA: Diagnosis not present

## 2021-06-15 DIAGNOSIS — Z79899 Other long term (current) drug therapy: Secondary | ICD-10-CM | POA: Insufficient documentation

## 2021-06-15 DIAGNOSIS — I110 Hypertensive heart disease with heart failure: Secondary | ICD-10-CM | POA: Insufficient documentation

## 2021-06-15 MED ORDER — SPIRONOLACTONE 25 MG PO TABS: 12.5000 mg | ORAL_TABLET | Freq: Every day | ORAL | 3 refills | Status: DC

## 2021-06-15 NOTE — Progress Notes (Signed)
?  ?Advanced Heart Failure Clinic Note  ? ?Referring Physician: Dr. Ernie Terrell ?Primary Care: Michael Bien, MD ?Primary Cardiologist: Dr. Virgina Terrell ?HF Cardiologist: Dr. Haroldine Terrell ? ?HPI:  ?66 y/o male with CAD s/p anterior STEMI (11/2017), HTN and OSA referrd by Dr. Virgina Terrell for further management of systolic HF. ?  ?Had PCI/BMS to LAD in 2011. In 11/2017 had anterior STEMI with PCI/DES to ostial LAD. Moderate non-obstructive CAD in LCX and RCA. ?  ?Echo 11/2017 EF 25-30% ?Echo 01/2018 EF 35-40% ?cMRI 02/2018: EF 45% Large LAD infarct ?cMRI 01/2021: EF 35% Subendocardial LGE > 50% in entire LAD territory RV 47% ?  ?Event monitor 11/2018: 373 episodes of sinus pauses >3 seconds.  Longest pause 10 seconds, intermittent first-degree AV block, all during sleep hours, not symptomatic. ?  ?Had syncopal episode 07/2020 while running. Eventually underwent ICD placement in 02/2021 with Dr. Lovena Terrell ?  ?Presented 04/2021 for a second opinion on his cardiomyopathy. He was walking 50 mins with his dogs every day and it seemed that it is has gotten a bit harder over the past year. He noted that he does not have to stop on hills. No CP, orthopnea or PND. No edema. Compliant with meds but has not been on any GDMT recently. He previously stopped losartan due to night sweats. Had rash with lisinopril. Had never been on spironolactone or SGLT2i. ? ?Today he returns to HF clinic for pharmacist medication titration. At last visit with MD, Michael Terrell 10 mg daily was initiated. He subsequently had follow-up with his primary cardiologist and losartan 25 mg daily was retrialed. Overall he is feeling good. He still has night sweats, but these resumed before losartan was restarted so he does not attribute it to the medication. Denies, dizziness, chest pain, or palpitations. He does have some lightheadedness that occurs about 2-3 times a week, he does not attribute this to anything, but has not checked his BP at these times. Of note, his BP  today in clinic was 150/92 mmHg, but concerned this may be due to [place] hypertension as his BP at home is consistently 95-105/60s mmHg per log he shows me. He admits he does get nervous when it is checked in office, however his home BP cuff has never been calibrated. I asked him to bring in his home BP cuff next time and check his BP when he feels lightheaded at home. His fatigue has been about the same, where he feels tired at the end of the day. He noticed he has felt SOB with his morning walks the last couple of days, but is not sure if this is due to allergies or he is just more aware of it with his appointment coming up. His breathing is good overall and he is able to complete all ADLs and exercise without needing to stop to rest. Weight at home is stable at 168-169 lbs. No LEE, PND or orthopnea. His appetite has been good, and he avoids adding salt to his foods.  ? ?HF Medications: ?Losartan 25 mg daily ?Farxiga 10 mg daily ? ?Has the patient been experiencing any side effects to the medications prescribed? No, rash to lisinopril in the past ? ?Does the patient have any problems obtaining medications due to transportation or finances? No, however, recently changed insurance plans 05/29/2021 to a [place] commercial plan ?PCP prescribes Repatha, that he is waiting on a new PA for approval ?His PCP gave him samples of Michael Terrell, that will last him the next couple of months.  I instructed him to call us if cost becomes an issue as we can help him with this. ? ?Understanding of regimen: good ?Understanding of indications: excellent ?Potential of compliance: excellent ?Patient understands to avoid NSAIDs. ?Patient understands to avoid decongestants. ?  ?Pertinent Lab Values: ?Labs 06/09/2021: Serum creatinine 1.11, BUN 17, Potassium 4.6, Sodium 140 ? ?Vital Signs: ?Weight: 172.8 lbs (last clinic weight: 173.4 lbs)  ?Blood pressure: 150/92 bpm ?Heart rate: 61 bpm ? ?Assessment/Plan: ?1. Systolic HF due to iCM ?- s/p  anterior STEMI in 11/2017 with PCI to ostial LAD  ?- Echo 01/2018 EF 35-40% ?- cMRI 02/2018: EF 45% Large LAD infarct ?- cMRI 01/2021: EF 35% Subendocardial LGE > 50% in entire LAD territory RV 47% ?- s/p ICD 02/2021. ICD interrogated in clinic No VT. ~ 4% RV pacing.  ?- NYHA II ?- Volume status stable, does not need loop diuretics ?-Continue losartan 25 mg daily ?-Start spironolactone 12.5 mg daily. Repeat BMET in 1 week ?- Continue Farxiga 10 mg daily ?- Will need to be careful with beta-blocker given underlying conduction disease as we do not want to force RV pacing ?  ?2. CAD s/p previous anterior STEMI ?- Had PCI/BMS to LAD in 2011. In 11/2017 had anterior stemi with PCI/DES to ostial LAD. Moderate non-obstructive CAD in LCX and RCA ?- followed by Dr. Virgina Terrell ?- intolerant of statins ?- Continue ASA/PCSK-91 ?- no s/s angina ?  ?3. OSA ?- intolerant of CPAP ? ?Follow up 07/12/21 with pharmacy clinic for medication titration.  ? ?Michael Terrell, PharmD, BCPS, BCCP, CPP ?Heart Failure Clinic Pharmacist ?[phone] ?  ?

## 2021-06-15 NOTE — Patient Instructions (Addendum)
It was a pleasure seeing you today! ? ?MEDICATIONS: ?-We are changing your medications today ?-Start spironolactone 12.5 mg (half-tablet) daily  ?-Call if you have questions about your medications. ? ?LABS: ?-We will call you if your labs need attention. ? ?NEXT APPOINTMENT: ?Return to clinic 07/12/21 with pharmacy clinic. ? ?In general, to take care of your heart failure: ?-Limit your fluid intake to 2 Liters (half-gallon) per day.   ?-Limit your salt intake to ideally 2-3 grams (2000-3000 mg) per day. ?-Weigh yourself daily and record, and bring that "weight diary" to your next appointment.  (Weight gain of 2-3 pounds in 1 day typically means fluid weight.) ?-The medications for your heart are to help your heart and help you live longer.   ?-Please contact us before stopping any of your heart medications. ? ?Call the clinic at [phone] with questions or to reschedule future appointments.  ?

## 2021-06-18 ENCOUNTER — Ambulatory Visit: Payer: Managed Care, Other (non HMO) | Admitting: Internal Medicine

## 2021-06-18 ENCOUNTER — Encounter: Payer: Self-pay | Admitting: Internal Medicine

## 2021-06-18 VITALS — BP 136/74 | HR 67 | Ht 69.0 in | Wt 174.6 lb

## 2021-06-18 DIAGNOSIS — I255 Ischemic cardiomyopathy: Secondary | ICD-10-CM | POA: Diagnosis not present

## 2021-06-18 DIAGNOSIS — Z95810 Presence of automatic (implantable) cardiac defibrillator: Secondary | ICD-10-CM

## 2021-06-18 HISTORY — DX: Presence of automatic (implantable) cardiac defibrillator: Z95.810

## 2021-06-18 LAB — CUP PACEART REMOTE DEVICE CHECK
Battery Remaining Longevity: 110 mo
Battery Remaining Percentage: 94 %
Battery Voltage: 3.04 V
Brady Statistic RV Percent Paced: 4.9 %
Date Time Interrogation Session: 20230417020839
HighPow Impedance: 73 Ohm
Implantable Lead Implant Date: 20230116
Implantable Lead Location: 753860
Implantable Pulse Generator Implant Date: 20230116
Lead Channel Impedance Value: 550 Ohm
Lead Channel Pacing Threshold Amplitude: 0.75 V
Lead Channel Pacing Threshold Pulse Width: 0.5 ms
Lead Channel Sensing Intrinsic Amplitude: 11.8 mV
Lead Channel Setting Pacing Amplitude: 3.5 V
Lead Channel Setting Pacing Pulse Width: 0.5 ms
Lead Channel Setting Sensing Sensitivity: 0.5 mV
Pulse Gen Serial Number: 111046020

## 2021-06-18 LAB — CUP PACEART INCLINIC DEVICE CHECK
Battery Remaining Longevity: 117 mo
Brady Statistic RV Percent Paced: 4.8 %
Date Time Interrogation Session: 20230421164754
HighPow Impedance: 75.375
Implantable Lead Implant Date: 20230116
Implantable Lead Location: 753860
Implantable Pulse Generator Implant Date: 20230116
Lead Channel Impedance Value: 525 Ohm
Lead Channel Pacing Threshold Amplitude: 0.75 V
Lead Channel Pacing Threshold Amplitude: 0.75 V
Lead Channel Pacing Threshold Pulse Width: 0.5 ms
Lead Channel Pacing Threshold Pulse Width: 0.5 ms
Lead Channel Sensing Intrinsic Amplitude: 11.8 mV
Lead Channel Setting Pacing Amplitude: 3.5 V
Lead Channel Setting Pacing Pulse Width: 0.5 ms
Lead Channel Setting Sensing Sensitivity: 0.5 mV
Pulse Gen Serial Number: 111046020

## 2021-06-18 NOTE — Patient Instructions (Signed)
Medication Instructions:  ?Your physician recommends that you continue on your current medications as directed. Please refer to the Current Medication list given to you today. ? ?Labwork: ?None ordered. ? ?Testing/Procedures: ?None ordered. ? ?Follow-Up: ?Your physician wants you to follow-up in: one year with Cristopher Peru, MD or one of the following Advanced Practice Providers on your designated Care Team:   ?Tommye Standard, PA-C ?Legrand Como "Jonni Sanger" [place], PA-C ? ?Remote monitoring is used to monitor your ICD from home. This monitoring reduces the number of office visits required to check your device to one time per year. It allows [place] to keep an eye on the functioning of your device to ensure it is working properly. You are scheduled for a device check from home on 09/13/2021. You may send your transmission at any time that day. If you have a wireless device, the transmission will be sent automatically. After your physician reviews your transmission, you will receive a postcard with your next transmission date. ? ?Any Other Special Instructions Will Be Listed Below (If Applicable). ? ?If you need a refill on your cardiac medications before your next appointment, please call your pharmacy.  ? ?Important Information About Sugar ? ? ? ? ? ? ? ?

## 2021-06-18 NOTE — Progress Notes (Signed)
? ? ? ? ?HPI ?Michael Terrell returns for evaluation of syncope s/p ICD insertion. He is a pleasant 66 yo man with a h/o CAD s/p MI twice. He has undergone primary PCI. He has not been able to tolerate a beta blocker due to symptomatic bradycardia and losartan resulted in symptomatic low bp. He has had variable EF's with most recent EF by cath of 25% but other EF determinations were higher in the 35-40% range. He describes a sudden episode of syncope in the past. No warning. He underwent ICD insertion about 3 months ago. ?Allergies  ?Allergen Reactions  ? Lisinopril Rash  ? Metoprolol Other (See Comments)  ?  Significant bradycardia  ? ? ? ?Current Outpatient Medications  ?Medication Sig Dispense Refill  ? aspirin EC 81 MG tablet Take 81 mg by mouth daily.    ? azelastine (ASTELIN) 0.1 % nasal spray Place 2 sprays into both nostrils 2 (two) times daily.    ? cetirizine (ZYRTEC) 10 MG tablet Take 10 mg by mouth daily.    ? Cholecalciferol (VITAMIN D-3 PO) Take 2,500 Units by mouth daily.    ? dapagliflozin propanediol (FARXIGA) 10 MG TABS tablet Take 1 tablet (10 mg total) by mouth daily before breakfast. 30 tablet 6  ? Evolocumab (REPATHA SURECLICK) 914 MG/ML SOAJ Inject 1 mL as directed every 14 (fourteen) days.    ? famotidine (PEPCID) 40 MG tablet Take 40 mg by mouth daily.    ? levothyroxine (SYNTHROID, LEVOTHROID) 75 MCG tablet Take 75 mcg by mouth daily before breakfast.    ? losartan (COZAAR) 25 MG tablet Take 1 tablet (25 mg total) by mouth daily. 30 tablet 3  ? montelukast (SINGULAIR) 10 MG tablet Take 10 mg by mouth as needed.    ? nitroGLYCERIN (NITROSTAT) 0.4 MG SL tablet Place 1 tablet (0.4 mg total) under the tongue every 5 (five) minutes as needed for chest pain. 25 tablet 3  ? omeprazole (PRILOSEC) 40 MG capsule Take 40 mg by mouth 2 (two) times daily as needed.    ? Polyvinyl Alcohol-Povidone (REFRESH OP) Place 1 drop into both eyes 3 (three) times daily as needed (for dry eyes).     ?  spironolactone (ALDACTONE) 25 MG tablet Take 0.5 tablets (12.5 mg total) by mouth daily. 30 tablet 3  ? ?No current facility-administered medications for this visit.  ? ? ? ?Past Medical History:  ?Diagnosis Date  ? Acute anterior wall MI ([place]) 10/2009  ? CAD (coronary artery disease)   ? BMS to LAD (2011)  ? Chest pain   ? Dyslipidemia   ? Heart attack ([place]) 11/2017  ? Hyperlipidemia   ? Hypertension   ? Sinus pause   ? Snoring   ? ? ?ROS: ? ? All systems reviewed and negative except as noted in the HPI. ? ? ?Past Surgical History:  ?Procedure Laterality Date  ? CARDIAC CATHETERIZATION  01/29/2010  ? patent LAD stent (3.5x76m [facility] - Dr. TCorky Downs9/07/2009); 50% prox PLA stenosis, 50% distal RCA stenosis (Dr. HNorlene Duel  ? CORONARY ANGIOPLASTY WITH STENT PLACEMENT  11/13/2009  ? 3.5x250mVision BMS to LAD (Dr. T.Corky Downs ? CORONARY/GRAFT ACUTE MI REVASCULARIZATION N/A 12/04/2017  ? Procedure: Coronary/Graft Acute MI Revascularization;  Surgeon: KeTroy SineMD;  [facility];  Service: Cardiovascular;  Laterality: N/A;  ? ICD IMPLANT N/A 03/15/2021  ? Procedure: ICD IMPLANT;  Surgeon: TaEvans LanceMD;  [facility];  Service:  Cardiovascular;  Laterality: N/A;  ? KNEE SURGERY    ? LEFT HEART CATH AND CORONARY ANGIOGRAPHY N/A 12/04/2017  ? Procedure: LEFT HEART CATH AND CORONARY ANGIOGRAPHY;  Surgeon: Troy Sine, MD;  Location: [facility];  Service: Cardiovascular;  Laterality: N/A;  ? NM MYOCAR PERF WALL MOTION  02/2010  ? bruce myoview - normal pattern of perfusion, low risk, no ischemia demonstrated, low risk  ? SHOULDER SURGERY    ? TRANSTHORACIC ECHOCARDIOGRAM  02/2012  ? EF 16-10%, grade 1 diastolic dysfunction; mild MR; LA mildly dilated; upper limit borderline LA enlargement  ? ? ? ?Family History  ?Problem Relation Age of Onset  ? CAD Father   ?     MI in his 36s  ? Heart disease Brother   ? Heart disease Brother   ? ? ? ?Social History  ? ?Socioeconomic History   ? Marital status: Married  ?  Spouse name: Not on file  ? Number of children: 2  ? Years of education: Not on file  ? Highest education level: Master's degree (e.g., MA, MS, MEng, MEd, MSW, MBA)  ?Occupational History  ? Occupation: Nurse, 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[employer]  ?  Employer: [employer]  ?Tobacco Use  ? Smoking status: Never  ? Smokeless tobacco: Never  ?Vaping Use  ? Vaping Use: Never used  ?Substance and Sexual Activity  ? Alcohol use: No  ?  Alcohol/week: 0.0 standard drinks  ? Drug use: No  ? Sexual activity: Not on file  ?Other Topics Concern  ? Not on file  ?Social History Narrative  ? Not on file  ? ?Social Determinants of Health  ? ?Financial Resource Strain: Not on file  ?Food Insecurity: Not on file  ?Transportation Needs: Not on file  ?Physical Activity: Not on file  ?Stress: Not on file  ?Social Connections: Not on file  ?Intimate Partner Violence: Not on file  ? ? ? ?BP 136/74   Pulse 67   Ht '5\' 9"'$  (1.753 m)   Wt 174 lb 9.6 oz (79.2 kg)   SpO2 96%   BMI 25.78 kg/m?  ? ?Physical Exam: ? ?Well appearing NAD ?HEENT: Unremarkable ?Neck:  No JVD, no thyromegally ?Lymphatics:  No adenopathy ?Back:  No CVA tenderness ?Lungs:  Clear with no wheezes ?HEART:  Regular rate rhythm, no murmurs, no rubs, no clicks ?Abd:  soft, positive bowel sounds, no organomegally, no rebound, no guarding ?Ext:  2 plus pulses, no edema, no cyanosis, no clubbing ?Skin:  No rashes no nodules ?Neuro:  CN II through XII intact, motor grossly intact ? ? ?DEVICE  ?Normal device function.  See PaceArt for details.  ? ?Assess/Plan:  ?ICM - he denies anginal symptoms. No change in his meds. ?Chronic systolic heart failure - his symptoms are class 2. He is on maximal GDMT. His fluid index improved when he started aldactone. ?Syncope - none since his ICD insertion ?ICD - his [employer] single chamber device is working normally. We will recheck in several months. ? ?Michael Overlie [name],MD ?

## 2021-06-22 ENCOUNTER — Ambulatory Visit (HOSPITAL_COMMUNITY)
Admission: RE | Admit: 2021-06-22 | Discharge: 2021-06-22 | Disposition: A | Discharge status: Home / Self Care | Payer: Managed Care, Other (non HMO) | Source: Ambulatory Visit | Attending: Physician Assistant | Admitting: Physician Assistant

## 2021-06-22 DIAGNOSIS — I5022 Chronic systolic (congestive) heart failure: Secondary | ICD-10-CM | POA: Insufficient documentation

## 2021-06-22 LAB — BASIC METABOLIC PANEL
Anion gap: 8 (ref 5–15)
BUN: 18 mg/dL (ref 8–23)
CO2: 26 mmol/L (ref 22–32)
Calcium: 9.5 mg/dL (ref 8.9–10.3)
Chloride: 105 mmol/L (ref 98–111)
Creatinine, Ser: 1.19 mg/dL (ref 0.61–1.24)
GFR, Estimated: >60 mL/min (ref >60)
Glucose, Bld: 91 mg/dL (ref 70–99)
Potassium: 4.7 mmol/L (ref 3.5–5.1)
Sodium: 139 mmol/L (ref 135–145)

## 2021-07-01 NOTE — Progress Notes (Signed)
Remote ICD transmission.   

## 2021-07-09 NOTE — Progress Notes (Incomplete)
?  ?Advanced Heart Failure Clinic Note  ? ?Referring Physician: Dr. Ernie Hew ?Primary Care: Fanny Bien, MD ?Primary Cardiologist: Dr. Virgina Jock ?HF Cardiologist: Dr. Haroldine Laws ? ?HPI:  ?66 y/o male with CAD s/p anterior STEMI (11/2017), HTN and OSA referrd by Dr. Virgina Jock for further management of systolic HF. ?  ?Had PCI/BMS to LAD in 2011. In 11/2017 had anterior STEMI with PCI/DES to ostial LAD. Moderate non-obstructive CAD in LCX and RCA. ?  ?Echo 11/2017 EF 25-30% ?Echo 01/2018 EF 35-40% ?cMRI 02/2018: EF 45% Large LAD infarct ?cMRI 01/2021: EF 35% Subendocardial LGE > 50% in entire LAD territory RV 47% ?  ?Event monitor 11/2018: 373 episodes of sinus pauses >3 seconds.  Longest pause 10 seconds, intermittent first-degree AV block, all during sleep hours, not symptomatic. ?  ?Had syncopal episode 07/2020 while running. Eventually underwent ICD placement in 02/2021 with Dr. Lovena Le ?  ?Presented 04/2021 for a second opinion on his cardiomyopathy. He was walking 50 mins with his dogs every day and it seemed that it is has gotten a bit harder over the past year. He noted that he does not have to stop on hills. No chest pain, orthopnea or PND. No edema. Compliant with meds but has not been on any GDMT recently. He previously stopped losartan due to night sweats. Had rash with lisinopril. He was seen by Dr. Haroldine Laws and Wilder Glade 10 mg daily was initiated. He subsequently had follow-up with his primary cardiologist and losartan 25 mg daily was retrialed. ? ?He returned to HF clinic for pharmacist medication titration on 06/15/2021. Overall he was feeling good and tolerating losartan. He denied, dizziness, chest pain, or palpitations. He did have some lightheadedness that occured about 2-3 times a week, but he did not attribute this to anything, however he had not checked his BP at these times. His BP in clinic was 150/92 mmHg, but there were concerns for [place] hypertension as his BP at home was consistently  95-105/60s mmHg per his log. He admitted that he gets nervous when it is checked in office, however his home BP cuff has never been calibrated. He was asked him to bring in his home BP cuff next time and to check his BP when he feels lightheaded at home. His fatigue had been about the same, where he felt tired at the end of the day. He noticed he had felt SOB with his morning walks the last couple of days, but was not sure if this was due to allergies or if he was just more aware of it with his appointment coming up. His breathing was good overall and he is able to complete all ADLs and exercise without needing to stop to rest. His weight at home was stable at 168-169 lbs. No LEE, PND or orthopnea. His appetite ha been good, and he was avoids adding salt to his foods.  ? ?Today he returns to HF clinic for pharmacist medication titration. At last visit with pharmacy clinic spironolactone 12.5 mg daily was initiated. He since saw Dr. Lovena Le, and was doing well and his fluid status was improved with the addition of spironolactone.  ? ?Overall feeling ***. ?Dizziness, lightheadedness, fatigue:  ?Chest pain or palpitations: ? ?How is your breathing?: *** ?SOB: ?Able to complete all ADLs. Activity level *** ? ?Weight at home pounds. Takes furosemide/torsemide/bumex *** mg *** daily.  ?LEE ?PND/Orthopnea ? ?Appetite *** ?Low-salt diet:  ? ?Physical Exam ?Cost/affordability of meds  ? ?HF Medications: ?Losartan 25 mg daily ?Spironolactone  12.5 mg daily ?Farxiga 10 mg daily ? ?Has the patient been experiencing any side effects to the medications prescribed? No, rash to lisinopril in the past ? ?Does the patient have any problems obtaining medications due to transportation or finances?  ?No, however, he recently changed insurance plans 05/29/2021 to a [place] commercial plan ?PCP prescribes Repatha, that he is waiting on a new PA for approval ?His PCP gave him samples of Wilder Glade, that will last him the next couple of months. I  instructed him to call us if cost becomes an issue as we can help him with this. ? ?Understanding of regimen: good ?Understanding of indications: excellent ?Potential of compliance: excellent ?Patient understands to avoid NSAIDs. ?Patient understands to avoid decongestants. ?  ?Pertinent Lab Values: ?Labs 06/22/2021: Serum creatine 1.19, BUN 18, Potassium 4.7, Sodium 139 - after starting spiro 12.5 ?Labs 06/09/2021: Serum creatinine 1.11, BUN 17, Potassium 4.6, Sodium 140 ? ?Vital Signs: ?Weight: 172.8 lbs (last clinic weight: 172.8 lbs)  ?Blood pressure: *** 150/92 bpm ?Heart rate: *** 61 bpm ? ?Plan ?No labs today ?A. Losartan to 50 mg daily - bmet 2 weeks ?Entresto cross-reactivity with rash could not find anything, captopril associated with rash d/t sulfahydral group ?B. Arlyce Harman to 25 mg daily - bmet 2 weeks ?Could do both changes potentially with close f/u ? ?6/26 with primary cards  ?No HF clinic f/u apts with [place].... does need f/u with Dr. Jacinto Reap? ? ?Assessment/Plan: ?1. Systolic HF due to iCM ?- s/p anterior STEMI in 11/2017 with PCI to ostial LAD  ?- Echo 01/2018 EF 35-40% ?- cMRI 02/2018: EF 45% Large LAD infarct ?- cMRI 01/2021: EF 35% Subendocardial LGE > 50% in entire LAD territory RV 47% ?- s/p ICD 02/2021. ICD interrogated in clinic No VT. ~ 4% RV pacing.  ?- NYHA II ?- Volume status stable, does not need loop diuretics *** ?-Continue losartan 25 mg daily ?-Continue spironolactone 12.5 mg daily ?- Continue Farxiga 10 mg daily ?- Will need to be careful with beta-blocker given underlying conduction disease as we do not want to force RV pacing ?  ?2. CAD s/p previous anterior STEMI ?- Had PCI/BMS to LAD in 2011. In 11/2017 had anterior stemi with PCI/DES to ostial LAD. Moderate non-obstructive CAD in LCX and RCA ?- followed by Dr. Virgina Jock ?- intolerant of statins ?- Continue ASA/PCSK-91 ?- no s/s angina ?  ?3. OSA ?- intolerant of CPAP ? ?Follow up *** ? ?Audry Riles, PharmD, BCPS, BCCP, CPP ?Heart Failure  Clinic Pharmacist ?[phone] ?  ?

## 2021-07-12 ENCOUNTER — Ambulatory Visit (HOSPITAL_COMMUNITY)
Admission: RE | Admit: 2021-07-12 | Discharge: 2021-07-12 | Disposition: A | Discharge status: Home / Self Care | Payer: Managed Care, Other (non HMO) | Source: Ambulatory Visit | Attending: Internal Medicine | Admitting: Internal Medicine

## 2021-07-12 DIAGNOSIS — Z7984 Long term (current) use of oral hypoglycemic drugs: Secondary | ICD-10-CM | POA: Diagnosis not present

## 2021-07-12 DIAGNOSIS — I110 Hypertensive heart disease with heart failure: Secondary | ICD-10-CM | POA: Insufficient documentation

## 2021-07-12 DIAGNOSIS — Z79899 Other long term (current) drug therapy: Secondary | ICD-10-CM | POA: Insufficient documentation

## 2021-07-12 DIAGNOSIS — G4733 Obstructive sleep apnea (adult) (pediatric): Secondary | ICD-10-CM | POA: Diagnosis not present

## 2021-07-12 DIAGNOSIS — I252 Old myocardial infarction: Secondary | ICD-10-CM | POA: Diagnosis not present

## 2021-07-12 DIAGNOSIS — I5022 Chronic systolic (congestive) heart failure: Secondary | ICD-10-CM | POA: Diagnosis not present

## 2021-07-12 DIAGNOSIS — I2510 Atherosclerotic heart disease of native coronary artery without angina pectoris: Secondary | ICD-10-CM | POA: Diagnosis not present

## 2021-07-12 DIAGNOSIS — Z7901 Long term (current) use of anticoagulants: Secondary | ICD-10-CM | POA: Diagnosis not present

## 2021-07-12 MED ORDER — SPIRONOLACTONE 25 MG PO TABS: 25.0000 mg | ORAL_TABLET | Freq: Every day | ORAL | 3 refills | Status: DC

## 2021-07-12 NOTE — Progress Notes (Signed)
?  ?Advanced Heart Failure Clinic Note  ? ?Referring Physician: Dr. Ernie Hew ?Primary Care: Fanny Bien, MD ?Primary Cardiologist: Dr. Virgina Jock ?HF Cardiologist: Dr. Haroldine Laws ? ?HPI:  ?66 y/o male with CAD s/p anterior STEMI (11/2017), HTN and OSA referrd by Dr. Virgina Jock for further management of systolic HF. ?  ?Had PCI/BMS to LAD in 2011. In 11/2017 had anterior STEMI with PCI/DES to ostial LAD. Moderate non-obstructive CAD in LCX and RCA. ?  ?Echo 11/2017 EF 25-30% ?Echo 01/2018 EF 35-40% ?cMRI 02/2018: EF 45% Large LAD infarct ?cMRI 01/2021: EF 35% Subendocardial LGE > 50% in entire LAD territory RV 47% ?  ?Event monitor 11/2018: 373 episodes of sinus pauses >3 seconds.  Longest pause 10 seconds, intermittent first-degree AV block, all during sleep hours, not symptomatic. ?  ?Had syncopal episode 07/2020 while running. Eventually underwent ICD placement in 02/2021 with Dr. Lovena Le. ?  ?Presented 04/2021 for a second opinion on his cardiomyopathy. He was walking 50 mins with his dogs every day and it seemed that it is has gotten a bit harder over the past year. He noted that he does not have to stop on hills. No chest pain, orthopnea or PND. No edema. Compliant with meds but has not been on any GDMT recently. He previously stopped losartan due to night sweats. Had rash with lisinopril. He was seen by Dr. Haroldine Laws and Wilder Glade 10 mg daily was initiated. He subsequently had follow-up with his primary cardiologist and losartan 25 mg daily was retrialed. ? ?He returned to HF clinic for pharmacist medication titration on 06/15/2021. Overall he was feeling good and tolerating losartan. He denied, dizziness, chest pain, or palpitations. He did have some lightheadedness that occured about 2-3 times a week, but he did not attribute this to anything, however he had not checked his BP at these times. His BP in clinic was 150/92 mmHg, but there were concerns for [place] hypertension as his BP at home was consistently  95-105/60s mmHg per his log. He admitted that he gets nervous when it is checked in office, however his home BP cuff has never been calibrated. He was asked him to bring in his home BP cuff next time and to check his BP when he feels lightheaded at home. His fatigue had been about the same, where he felt tired at the end of the day. He noticed he had felt SOB with his morning walks the last couple of days, but was not sure if this was due to allergies or if he was just more aware of it with his appointment coming up. His breathing was good overall and he is able to complete all ADLs and exercise without needing to stop to rest. His weight at home was stable at 168-169 lbs. No LEE, PND or orthopnea. His appetite had been good, and he was avoiding adding salt to his foods.  ? ?Today he returns to HF clinic for pharmacist medication titration. At last visit with pharmacy clinic spironolactone 12.5 mg daily was initiated. He since saw Dr. Lovena Le, and was doing well and his fluid status was improved with the addition of spironolactone. Overall he is feeling okay. He did have a pain in his chest yesterday, that caused him to take nitroglycerin, however it did not seem to help and his chest stayed sore for the rest of the day. He denies any other chest pain so he does not attribute this to his heart. He denies palpitations, dizziness, or lightheadedness. He does occassionally have  SOB however this seems to be exertional when he goes uphill. His breathing has been better since the last time I saw him as his allergies are not as bad. His fatigue has been about the same, he is tired at the end of the day, but this does not stop him from doing anything. He is exercising 6-7 days a week and walking his dogs. He is able to complete all ADLs. His weight at home has been consistently ~168 lbs. No LEE on exam. He denies PND or orthopnea. His appetite has been good and he adheres to a low-salt diet. ? ?HF Medications: ?Losartan 25  mg daily ?Spironolactone 12.5 mg daily ?Farxiga 10 mg daily ? ?Has the patient been experiencing any side effects to the medications prescribed? No, rash to lisinopril in the past. This was after multiple years of taking the medication per his report.  ? ?Does the patient have any problems obtaining medications due to transportation or finances?  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 No cost issues with his new insurance.  ? ?Understanding of regimen: good ?Understanding of indications: excellent ?Potential of compliance: excellent ?Patient understands to avoid NSAIDs. ?Patient understands to avoid decongestants. ?  ?Pertinent Lab Values: ?Labs 06/22/2021: Serum creatine 1.19, BUN 18, Potassium 4.7, Sodium 139 - after starting spiro 12.5 ?Labs 06/09/2021: Serum creatinine 1.11, BUN 17, Potassium 4.6, Sodium 140 ? ?Vital Signs: ?Weight: 171.2 lbs (last clinic weight: 172.8 lbs)  ?Blood pressure: 118/80 mmHg ?Heart rate: 56 bpm ? ?Assessment/Plan: ?1. Systolic HF due to iCM ?- s/p anterior STEMI in 11/2017 with PCI to ostial LAD  ?- Echo 01/2018 EF 35-40% ?- cMRI 02/2018: EF 45% Large LAD infarct ?- cMRI 01/2021: EF 35% Subendocardial LGE > 50% in entire LAD territory RV 47% ?- s/p ICD 02/2021. ICD interrogated in clinic No VT. ~ 4% RV pacing.  ?- NYHA II ?- Volume status stable, does not need loop diuretics  ?-Continue losartan 25 mg daily ?-Increase spironolactone to 25 mg daily. BMET in 2 weeks with PCP. Written prescription given today for labs. Results will be faxed to the office.  ?- Continue Farxiga 10 mg daily ?- Will need to be careful with beta-blocker given underlying conduction disease as we do not want to force RV pacing ?  ?2. CAD s/p previous anterior STEMI ?- Had PCI/BMS to LAD in 2011. In 11/2017 had anterior stemi with PCI/DES to ostial LAD. Moderate non-obstructive CAD in LCX and RCA ?- followed by Dr. Virgina Jock ?- intolerant of statins ?- Continue ASA/PCSK-91 ?- no  s/s angina ?  ?3. OSA ?- intolerant of CPAP ? ?Follow up July with Dr. Haroldine Laws.  ? ?Audry Riles, PharmD, BCPS, BCCP, CPP ?Heart Failure Clinic Pharmacist ?[phone] ?  ?

## 2021-07-12 NOTE — Patient Instructions (Signed)
It was a pleasure seeing you today! ? ?MEDICATIONS: ?-We are changing your medications today ?-Increase spironolactone to 25 mg (1 tablet) daily ?-Call if you have questions about your medications. ? ?LABS: ?-Please get lab work done on 07/23/21 ? ?NEXT APPOINTMENT: ?Return to clinic in July with Dr. Haroldine Laws. ? ?In general, to take care of your heart failure: ?-Limit your fluid intake to 2 Liters (half-gallon) per day.   ?-Limit your salt intake to ideally 2-3 grams (2000-3000 mg) per day. ?-Weigh yourself daily and record, and bring that "weight diary" to your next appointment.  (Weight gain of 2-3 pounds in 1 day typically means fluid weight.) ?-The medications for your heart are to help your heart and help you live longer.   ?-Please contact us before stopping any of your heart medications. ? ?Call the clinic at [phone] with questions or to reschedule future appointments.  ?

## 2021-07-23 DIAGNOSIS — E782 Mixed hyperlipidemia: Secondary | ICD-10-CM | POA: Diagnosis not present

## 2021-07-23 DIAGNOSIS — I10 Essential (primary) hypertension: Secondary | ICD-10-CM | POA: Diagnosis not present

## 2021-07-23 DIAGNOSIS — D649 Anemia, unspecified: Secondary | ICD-10-CM | POA: Diagnosis not present

## 2021-08-19 ENCOUNTER — Encounter (HOSPITAL_COMMUNITY): Payer: Self-pay | Admitting: Gastroenterology

## 2021-08-19 NOTE — Progress Notes (Signed)
Attempted to obtain medical history via telephone, unable to reach at this time. Unable to leave voicemail message left requesting return call to pre surgical testing department.

## 2021-08-23 ENCOUNTER — Ambulatory Visit: Payer: Managed Care, Other (non HMO) | Admitting: Cardiology

## 2021-08-23 ENCOUNTER — Other Ambulatory Visit: Payer: Self-pay | Admitting: Cardiology

## 2021-08-23 DIAGNOSIS — I5022 Chronic systolic (congestive) heart failure: Secondary | ICD-10-CM

## 2021-08-26 NOTE — Anesthesia Preprocedure Evaluation (Addendum)
Anesthesia Evaluation  Patient identified by MRN, date of birth, ID band Patient awake    Reviewed: Allergy & Precautions, H&P , NPO status , Patient's Chart, lab work & pertinent test results, reviewed documented beta blocker date and time   Airway Mallampati: I  TM Distance: >3 FB Neck ROM: Full    Dental no notable dental hx. (+) Teeth Intact, Dental Advisory Given   Pulmonary sleep apnea and Continuous Positive Airway Pressure Ventilation ,    Pulmonary exam normal breath sounds clear to auscultation       Cardiovascular Exercise Tolerance: Good hypertension, + CAD, + Past MI and + Cardiac Stents  Normal cardiovascular exam+ dysrhythmias + Cardiac Defibrillator  Rhythm:Regular Rate:Normal  Echocardiogram 12/24/2020:  Left ventricle cavity is normal in size. Normal left ventricular wall  thickness. Moderately depressed LV systolic function with visual EF  35-40%. Left ventricle regional wall motion findings: Mid anteroseptal,  Mid anterior, Mid inferoseptal, Apical anterior, Apical septal and Apical  cap hypokinesis. Normal diastolic filling pattern.    Neuro/Psych negative neurological ROS  negative psych ROS   GI/Hepatic negative GI ROS, Neg liver ROS,   Endo/Other  negative endocrine ROS  Renal/GU negative Renal ROS  negative genitourinary   Musculoskeletal   Abdominal   Peds  Hematology negative hematology ROS (+)   Anesthesia Other Findings   Reproductive/Obstetrics negative OB ROS                            Anesthesia Physical Anesthesia Plan  ASA: 3  Anesthesia Plan: MAC   Post-op Pain Management: Minimal or no pain anticipated   Induction: Intravenous  PONV Risk Score and Plan: 1 and Propofol infusion  Airway Management Planned: Mask, Natural Airway, Nasal Cannula and Simple Face Mask  Additional Equipment: None  Intra-op Plan:   Post-operative Plan:   Informed  Consent: I have reviewed the patients History and Physical, chart, labs and discussed the procedure including the risks, benefits and alternatives for the proposed anesthesia with the patient or authorized representative who has indicated his/her understanding and acceptance.     Dental Advisory Given  Plan Discussed with: CRNA and Anesthesiologist  Anesthesia Plan Comments:        Anesthesia Quick Evaluation

## 2021-08-27 ENCOUNTER — Encounter (HOSPITAL_COMMUNITY): Payer: Self-pay | Admitting: Gastroenterology

## 2021-08-27 ENCOUNTER — Ambulatory Visit (HOSPITAL_BASED_OUTPATIENT_CLINIC_OR_DEPARTMENT_OTHER): Payer: Managed Care, Other (non HMO) | Admitting: Anesthesiology

## 2021-08-27 ENCOUNTER — Ambulatory Visit (HOSPITAL_COMMUNITY)
Admission: RE | Admit: 2021-08-27 | Discharge: 2021-08-27 | Disposition: A | Discharge status: Home / Self Care | Payer: Managed Care, Other (non HMO) | Attending: Gastroenterology | Admitting: Gastroenterology

## 2021-08-27 ENCOUNTER — Ambulatory Visit (HOSPITAL_COMMUNITY): Payer: Managed Care, Other (non HMO) | Admitting: Anesthesiology

## 2021-08-27 ENCOUNTER — Encounter (HOSPITAL_COMMUNITY)
Admission: RE | Disposition: A | Discharge status: Home / Self Care | Payer: Self-pay | Source: Home / Self Care | Attending: Gastroenterology

## 2021-08-27 DIAGNOSIS — K5730 Diverticulosis of large intestine without perforation or abscess without bleeding: Secondary | ICD-10-CM

## 2021-08-27 DIAGNOSIS — I2510 Atherosclerotic heart disease of native coronary artery without angina pectoris: Secondary | ICD-10-CM | POA: Diagnosis not present

## 2021-08-27 DIAGNOSIS — K219 Gastro-esophageal reflux disease without esophagitis: Secondary | ICD-10-CM | POA: Insufficient documentation

## 2021-08-27 DIAGNOSIS — G4730 Sleep apnea, unspecified: Secondary | ICD-10-CM | POA: Insufficient documentation

## 2021-08-27 DIAGNOSIS — Z955 Presence of coronary angioplasty implant and graft: Secondary | ICD-10-CM | POA: Diagnosis not present

## 2021-08-27 DIAGNOSIS — Z1211 Encounter for screening for malignant neoplasm of colon: Secondary | ICD-10-CM

## 2021-08-27 DIAGNOSIS — D124 Benign neoplasm of descending colon: Secondary | ICD-10-CM | POA: Diagnosis not present

## 2021-08-27 DIAGNOSIS — I252 Old myocardial infarction: Secondary | ICD-10-CM | POA: Diagnosis not present

## 2021-08-27 DIAGNOSIS — Z79899 Other long term (current) drug therapy: Secondary | ICD-10-CM | POA: Diagnosis not present

## 2021-08-27 DIAGNOSIS — K635 Polyp of colon: Secondary | ICD-10-CM | POA: Diagnosis not present

## 2021-08-27 DIAGNOSIS — D120 Benign neoplasm of cecum: Secondary | ICD-10-CM | POA: Insufficient documentation

## 2021-08-27 DIAGNOSIS — G4733 Obstructive sleep apnea (adult) (pediatric): Secondary | ICD-10-CM

## 2021-08-27 DIAGNOSIS — I10 Essential (primary) hypertension: Secondary | ICD-10-CM | POA: Diagnosis not present

## 2021-08-27 HISTORY — PX: POLYPECTOMY: SHX5525

## 2021-08-27 HISTORY — PX: COLONOSCOPY WITH PROPOFOL: SHX5780

## 2021-08-27 SURGERY — COLONOSCOPY WITH PROPOFOL
Anesthesia: Monitor Anesthesia Care

## 2021-08-27 MED ORDER — LACTATED RINGERS IV SOLN: INTRAVENOUS | Status: DC

## 2021-08-27 MED ORDER — PROPOFOL 500 MG/50ML IV EMUL
INTRAVENOUS | Status: DC | PRN
  Administered 2021-08-27: 125 ug/kg/min via INTRAVENOUS

## 2021-08-27 MED ORDER — PROPOFOL 10 MG/ML IV BOLUS
INTRAVENOUS | Status: DC | PRN
  Administered 2021-08-27: 20 mg via INTRAVENOUS
  Administered 2021-08-27: 40 mg via INTRAVENOUS

## 2021-08-27 MED ORDER — SODIUM CHLORIDE 0.9 % IV SOLN: INTRAVENOUS | Status: DC

## 2021-08-27 SURGICAL SUPPLY — 22 items

## 2021-08-27 NOTE — H&P (Signed)
Michael Terrell HPI: This 66 year old white male presents to the office for colorectal cancer screening. He can occasionally can go 1-2 days without a BM. He has 2-3 BM's per day with no obvious blood or mucus in the stool. He takes Famotidine 20 mg for acid reflux with good control. He takes Ferrous sulfate 325 mg every other day. He was told  by his PCP 5 years ago that he "may be anemic". He continued to take the iron but is not sure is he still has iron deficiency anemia. He has a good appetite and his weight has been stable. He denies having any complaints of abdominal pain, nausea, vomiting, dysphagia or odynophagia. He denies having a family history of colon cancer, celiac sprue or IBD. His last colonoscopy done on 05/19/2014 by Dr. Earlean Shawl that revealed diverticulosis, internal hemorrhoids and colonic polyps were removed. He was to return for a colonoscopy 3-5 years but never did so.  Past Medical History:  Diagnosis Date   Acute anterior wall MI ([place]) 10/2009   CAD (coronary artery disease)    BMS to LAD (2011)   Chest pain    Dyslipidemia    Heart attack ([place]) 11/2017   Hyperlipidemia    Hypertension    Sinus pause    Snoring     Past Surgical History:  Procedure Laterality Date   CARDIAC CATHETERIZATION  01/29/2010   patent LAD stent (3.5x66m [facility] - Dr. TCorky Downs9/07/2009); 50% prox PLA stenosis, 50% distal RCA stenosis (Dr. HNorlene Duel   CORONARY ANGIOPLASTY WITH STENT PLACEMENT  11/13/2009   3.5x23mVision BMS to LAD (Dr. T.Corky Downs  CORONARY/GRAFT ACUTE MI REVASCULARIZATION N/A 12/04/2017   Procedure: Coronary/Graft Acute MI Revascularization;  Surgeon: KeTroy SineMD;  [facility];  Service: Cardiovascular;  Laterality: N/A;   ICD IMPLANT N/A 03/15/2021   Procedure: ICD IMPLANT;  Surgeon: TaEvans LanceMD;  [facility];  Service: Cardiovascular;  Laterality: N/A;   KNEE SURGERY     LEFT HEART CATH AND CORONARY ANGIOGRAPHY N/A  12/04/2017   Procedure: LEFT HEART CATH AND CORONARY ANGIOGRAPHY;  Surgeon: KeTroy SineMD;  [facility];  Service: Cardiovascular;  Laterality: N/A;   NM MYOCAR PERF WALL MOTION  02/2010   bruce myoview - normal pattern of perfusion, low risk, no ischemia demonstrated, low risk   SHOULDER SURGERY     TRANSTHORACIC ECHOCARDIOGRAM  02/2012   EF 5510-27%grade 1 diastolic dysfunction; mild MR; LA mildly dilated; upper limit borderline LA enlargement    Family History  Problem Relation Age of Onset   CAD Father        MI in his 3076s Heart disease Brother    Heart disease Brother     Social History:  reports that he has never smoked. He has never used smokeless tobacco. He reports that he does not drink alcohol and does not use drugs.  Allergies:  Allergies  Allergen Reactions   Lisinopril Rash   Metoprolol Other (See Comments)    Significant bradycardia    Medications: Scheduled: Continuous:  sodium chloride     lactated ringers 10 mL/hr at 08/27/21 0914    No results found for this or any previous visit (from the past 24 hour(s)).   No results found.  ROS:  As stated above in the HPI otherwise negative.  There were no vitals taken for this visit.    PE:  Gen: NAD, Alert and Oriented HEENT:  [place]/AT, EOMI Neck: Supple, no LAD Lungs: CTA Bilaterally CV: RRR without M/G/R ABD: Soft, NTND, +BS Ext: No C/C/E  Assessment/Plan: 1) Screening colonoscopy  Abram Sax D 08/27/2021, 7:23 AM

## 2021-08-27 NOTE — Transfer of Care (Signed)
Immediate Anesthesia Transfer of Care Note  Patient: Michael Terrell  Procedure(s) Performed: COLONOSCOPY WITH PROPOFOL POLYPECTOMY  Patient Location: Endoscopy Unit  Anesthesia Type:MAC  Level of Consciousness: awake, alert  and oriented  Airway & Oxygen Therapy: Patient Spontanous Breathing  Post-op Assessment: Report given to RN and Post -op Vital signs reviewed and stable  Post vital signs: Reviewed and stable  Last Vitals:  Vitals Value Taken Time  BP 132/67 08/27/21 0952  Temp    Pulse 67 08/27/21 0952  Resp 12 08/27/21 0952  SpO2 98 % 08/27/21 0952  Vitals shown include unvalidated device data.  Last Pain:  Vitals:   08/27/21 0755  TempSrc: Tympanic  PainSc: 0-No pain         Complications: No notable events documented.

## 2021-08-27 NOTE — Op Note (Signed)
[facility] Patient Name: Michael Terrell Procedure Date: 08/27/2021 MRN: [id number] Attending MD: Carol Ada , MD Date of Birth: 08-Feb-1956 CSN: [id number] Age: 66 Admit Type: Outpatient Procedure:                Colonoscopy Indications:              Screening for colorectal malignant neoplasm Providers:                Carol Ada, MD, Jaci Carrel, RN, William Dalton, Technician, Darliss Cheney, Technician Referring MD:              Medicines:                Propofol per Anesthesia Complications:            No immediate complications. Estimated Blood Loss:     Estimated blood loss: none. Procedure:                Pre-Anesthesia Assessment:                           - Prior to the procedure, a History and Physical                            was performed, and patient medications and                            allergies were reviewed. The patient's tolerance of                            previous anesthesia was also reviewed. The risks                            and benefits of the procedure and the sedation                            options and risks were discussed with the patient.                            All questions were answered, and informed consent                            was obtained. Prior Anticoagulants: The patient has                            taken no previous anticoagulant or antiplatelet                            agents. ASA Grade Assessment: III - A patient with                            severe systemic disease. After reviewing the risks  and benefits, the patient was deemed in                            satisfactory condition to undergo the procedure.                           - Sedation was administered by an anesthesia                            professional. Deep sedation was attained.                           After obtaining informed consent, the colonoscope                             was passed under direct vision. Throughout the                            procedure, the patient's blood pressure, pulse, and                            oxygen saturations were monitored continuously. The                            CF-HQ190L ([id number]) Olympus colonoscope was                            introduced through the anus and advanced to the the                            cecum, identified by appendiceal orifice and                            ileocecal valve. The colonoscopy was performed                            without difficulty. The patient tolerated the                            procedure well. The quality of the bowel                            preparation was evaluated using the BBPS [facility]                            Bowel Preparation Scale) with scores of: Right                            Colon = 2 (minor amount of residual staining, small                            fragments of stool and/or opaque liquid, but mucosa  seen well), Transverse Colon = 2 (minor amount of                            residual staining, small fragments of stool and/or                            opaque liquid, but mucosa seen well) and Left Colon                            = 2 (minor amount of residual staining, small                            fragments of stool and/or opaque liquid, but mucosa                            seen well). The total BBPS score equals 6. The                            quality of the bowel preparation was good. The                            ileocecal valve, appendiceal orifice, and rectum                            were photographed. Scope In: 9:20:32 AM Scope Out: 9:47:26 AM Scope Withdrawal Time: 0 hours 22 minutes 46 seconds  Total Procedure Duration: 0 hours 26 minutes 54 seconds  Findings:      Three sessile polyps were found in the transverse colon and cecum. The       polyps were 2 to 3 mm in size. These polyps were removed with a  cold       snare. Resection and retrieval were complete.      Scattered small and large-mouthed diverticula were found in the entire       colon. Impression:               - Three 2 to 3 mm polyps in the transverse colon                            and in the cecum, removed with a cold snare.                            Resected and retrieved.                           - Diverticulosis in the entire examined colon. Moderate Sedation:      Not Applicable - Patient had care per Anesthesia. Recommendation:           - Patient has a contact number available for                            emergencies. The signs and symptoms of potential  delayed complications were discussed with the                            patient. Return to normal activities tomorrow.                            Written discharge instructions were provided to the                            patient.                           - Resume previous diet.                           - Continue present medications.                           - Await pathology results.                           - Repeat colonoscopy in 5 years for surveillance. Procedure Code(s):        --- Professional ---                           [phone], Colonoscopy, flexible; with removal of                            tumor(s), polyp(s), or other lesion(s) by snare                            technique Diagnosis Code(s):        --- Professional ---                           Z12.11, Encounter for screening for malignant                            neoplasm of colon                           K63.5, Polyp of colon                           K57.30, Diverticulosis of large intestine without                            perforation or abscess without bleeding CPT copyright 2019 American Medical Association. All rights reserved. The codes documented in this report are preliminary and upon coder review may  be revised to meet current compliance  requirements. Carol Ada, MD Carol Ada, MD 08/27/2021 9:52:36 AM This report has been signed electronically. Number of Addenda: 0

## 2021-08-27 NOTE — Discharge Instructions (Signed)

## 2021-08-27 NOTE — Anesthesia Postprocedure Evaluation (Signed)
Anesthesia Post Note  Patient: Michael Terrell  Procedure(s) Performed: COLONOSCOPY WITH PROPOFOL POLYPECTOMY     Patient location during evaluation: PACU Anesthesia Type: MAC Level of consciousness: awake and alert Pain management: pain level controlled Vital Signs Assessment: post-procedure vital signs reviewed and stable Respiratory status: spontaneous breathing, nonlabored ventilation, respiratory function stable and patient connected to nasal cannula oxygen Cardiovascular status: stable and blood pressure returned to baseline Postop Assessment: no apparent nausea or vomiting Anesthetic complications: no   No notable events documented.  Last Vitals:  Vitals:   08/27/21 1012 08/27/21 1016  BP: 122/69 129/74  Pulse: (!) 45 (!) 105  Resp: 12 12  Temp:    SpO2: 99% 99%    Last Pain:  Vitals:   08/27/21 0952  TempSrc: Axillary  PainSc:                  [name]

## 2021-08-30 ENCOUNTER — Ambulatory Visit: Payer: Managed Care, Other (non HMO) | Admitting: Cardiology

## 2021-08-30 LAB — SURGICAL PATHOLOGY

## 2021-09-02 ENCOUNTER — Ambulatory Visit: Payer: Managed Care, Other (non HMO) | Admitting: Cardiology

## 2021-09-02 ENCOUNTER — Encounter: Payer: Self-pay | Admitting: Cardiology

## 2021-09-02 VITALS — BP 128/76 | HR 72 | Temp 98.3°F | Resp 16 | Ht 66.0 in | Wt 170.0 lb

## 2021-09-02 DIAGNOSIS — I2510 Atherosclerotic heart disease of native coronary artery without angina pectoris: Secondary | ICD-10-CM

## 2021-09-02 DIAGNOSIS — I5022 Chronic systolic (congestive) heart failure: Secondary | ICD-10-CM

## 2021-09-02 DIAGNOSIS — I255 Ischemic cardiomyopathy: Secondary | ICD-10-CM

## 2021-09-02 NOTE — Progress Notes (Signed)
Subjective:   Michael Terrell, male    DOB: 16-Mar-1955, 66 y.o.   MRN: [id number]  Chief complaint:  Coronary artery disease   HPI  66 y/o Caucasian male with hypertension, hyperlipidemia, CAD s/p prior LAD BMS 2011, anterior STEMI on 12/04/2017, s/p PPCI to ostial LAD, ischemic cardiomyopathy with recovered EF, St Jude s/p ICD (02/2021), h/o sinus pauses, OSA-noncompliant with CPAP.  Patient is doing well, denies chest pain, shortness of breath, palpitations, leg edema, orthopnea, PND, TIA/syncope.   Current Outpatient Medications:    aspirin EC 81 MG tablet, Take 81 mg by mouth daily., Disp: , Rfl:    azelastine (ASTELIN) 0.1 % nasal spray, Place 2 sprays into both nostrils as needed for allergies ([place])., Disp: , Rfl:    cetirizine (ZYRTEC) 10 MG tablet, Take 10 mg by mouth daily., Disp: , Rfl:    Cholecalciferol (VITAMIN D-3 PO), Take 2,500 Units by mouth daily., Disp: , Rfl:    dapagliflozin propanediol (FARXIGA) 10 MG TABS tablet, Take 1 tablet (10 mg total) by mouth daily before breakfast., Disp: 30 tablet, Rfl: 6   Evolocumab (REPATHA SURECLICK) 121 MG/ML SOAJ, Inject 140 mg as directed every 14 (fourteen) days., Disp: , Rfl:    famotidine (PEPCID) 20 MG tablet, Take 20 mg by mouth 2 (two) times daily., Disp: , Rfl:    levothyroxine (SYNTHROID, LEVOTHROID) 75 MCG tablet, Take 75 mcg by mouth daily before breakfast., Disp: , Rfl:    losartan (COZAAR) 25 MG tablet, TAKE 1 TABLET BY MOUTH EVERY DAY, Disp: 30 tablet, Rfl: 3   montelukast (SINGULAIR) 10 MG tablet, Take 10 mg by mouth at bedtime as needed (allergies in the spring)., Disp: , Rfl:    nitroGLYCERIN (NITROSTAT) 0.4 MG SL tablet, Place 1 tablet (0.4 mg total) under the tongue every 5 (five) minutes as needed for chest pain., Disp: 25 tablet, Rfl: 3   omeprazole (PRILOSEC) 40 MG capsule, Take 40 mg by mouth 2 (two) times daily as needed. (Patient not taking: Reported on 07/12/2021), Disp: , Rfl:    Polyvinyl  Alcohol-Povidone (REFRESH OP), Place 3 drops into both eyes 2 (two) times daily., Disp: , Rfl:    spironolactone (ALDACTONE) 25 MG tablet, Take 1 tablet (25 mg total) by mouth daily., Disp: 90 tablet, Rfl: 3  Cardiovascular studies:  CPEX 05/19/2021: Exercise testing with gas exchange demonstrates normal(excellent) functional capacity when compared to matched sedentary norms. Markedly elevated VE/VCO2 slope suggests at least a mild HF limitation with mild to moderate hyperventilation likely driving the VE/VCO2 slope even further. Patient was achieving ventilatory limits but also demonstrates at least a mild limitation due to HF, even considering excellent PVO2.   St Jude ICD placement (Dr. Lovena Le) 03/15/2021  CMRI 02/09/2022: 1. Subendocardial late gadolinium enhancement consistent with prior infarct in LV basal anteroseptal, mid anteroseptal/anterior, apical septal/anterior walls, and apex. LGE is greater than 50% transmural suggesting nonviability in this territory.   2.  Mild LV dilatation with moderate systolic dysfunction (EF 97%)   3.  Normal RV size and low normal systolic function (EF 58%)    EKG 12/16/2020: Sinus rhythm 78 bpm First degree A-V block Sinus pause with ectopic atrial beat Left atrial enlargement Old anteroseptal infarct  Event monitor 12/07/2018-01/05/2019: Baseline rhythm sinus.  Heart rate 27-149 bpm.  Average heart rate 62 bpm. 373 episodes of sinus pauses >3 seconds.  Longest pause 10 seconds, intermittent first-degree AV block, all during sleep hours, not symptomatic. No definite evidence of atrial fibrillation.  Rate with possible junctional rhythm during sleep likely related to sleep disordered breathing. One episode of RVR likely atrial tachycardia.  No symptoms reported.    Discussed above findings with the patient.  Previously, I have started him on Xarelto, milligrams daily for possible atrial fibrillation noted on event monitor.  On detailed review of  end of report, I do not believe.  Thus, I have asked him to stop Xarelto 20 mg daily, and asked to resume Brilinta at low-dose 60 mg twice daily for longer duration given his recurrent MI history.  He should start this on January 20, 2019.  Again, given his lack of symptoms with long sinus pauses during sleep, pacemaker is not indicated.   Cath 12/04/2017: LM: Normal 100% ostial LAD occlusion, successful PPI with Resolute Onyx DES 3.0 X 38 mm Moderate nonobstructive disease in RCA and LCx.  Echocardiogram 01/29/2018: Left ventricle cavity is normal in size. Accurate LVEF estimation limited to poor endocardial visualization. Visual EF is 35-40%. Normal diastolic filling pattern. Left ventricle regional wall motion findings: Mid to distal LAD moderate hypokinesis.  Consider MRI for more accurate EF estimation.  Calculated EF 40%. Mild tricuspid regurgitation.  Compared to hospital echocardiogram dated 12/05/2017, LVEF is marginally improved.  Cardiac MRI 03/15/2018: Normal LV size. Mild basal septal hypertrophy. LGE: 50-75% in basal anteroseptal wall.          75-100% mid anteroseptal, anterior, apical septal amd anterior walls with poor chance of recovery.  Normal RV function. Mild MR, mild TR.  Normal sized great vessels  Recent labs: 06/22/2021: Glucose 91, BUN/Cr 18/1.19. EGFR >60. Na/K 139/4.7. Rest of the CMP normal H/H 15/43. MCV 92. Platelets 195  Review of Systems  Cardiovascular:  Positive for syncope. Negative for chest pain, dyspnea on exertion, leg swelling and palpitations.         Vitals:   09/02/21 1310  BP: 128/76  Pulse: 72  Resp: 16  Temp: 98.3 F (36.8 C)  SpO2: 96%     Objective:    Physical Exam Vitals and nursing note reviewed.  Constitutional:      General: He is not in acute distress. Neck:     Vascular: No JVD.  Cardiovascular:     Rate and Rhythm: Normal rate and regular rhythm.     Heart sounds: Normal heart sounds. No murmur  heard. Pulmonary:     Effort: Pulmonary effort is normal.     Breath sounds: Normal breath sounds. No wheezing or rales.  Musculoskeletal:     Right lower leg: No edema.     Left lower leg: No edema.     No diagnosis found.  No orders of the defined types were placed in this encounter.  No orders of the defined types were placed in this encounter.        Assessment & Recommendations:    66 y/o Caucasian male with hypertension, hyperlipidemia, CAD s/p prior LAD BMS 2011, anterior STEMI on 12/04/2017, s/p PPCI to ostial LAD, ischemic cardiomyopathy with recovered EF, St Jude s/p ICD (02/2021), h/o sinus pauses, OSA-noncompliant with CPAP  Ischemic cardiomyopathy: EF 35% (CMRI 01/2021). LAD infarct with no viability. NYHA class II. CPX with at least mild HF component. Now s/p St Jude ICD (02/2021) Allergic to lisinopril, thus unable to use Entresto. Not on beta blocker due to sinus pauses Continue Farxiga 10 mg, losartan 25 mg daily, spiromnolactone 25 mg daily..   Repeat echocardiogram in 3 months  CAD: Coronary artery disease s/p ostial LAD  PCI for anterior wall MI in 11/2017. Continue Aspirin 81 mg daily.  Continue Repatha given his statin tolerance. LDL 35 (11/2020).  Rest of the maangement as above.  OSA: Emphazied compliance.  F/u in 6 months  Berman Grainger Esther Hardy, MD [facility] Pager: [phone] Office: [phone] If no answer Cell [phone]

## 2021-09-03 ENCOUNTER — Encounter: Payer: Self-pay | Admitting: Cardiology

## 2021-09-13 ENCOUNTER — Ambulatory Visit (INDEPENDENT_AMBULATORY_CARE_PROVIDER_SITE_OTHER): Payer: Managed Care, Other (non HMO)

## 2021-09-13 DIAGNOSIS — I5022 Chronic systolic (congestive) heart failure: Secondary | ICD-10-CM | POA: Diagnosis not present

## 2021-09-13 DIAGNOSIS — I255 Ischemic cardiomyopathy: Secondary | ICD-10-CM

## 2021-09-15 LAB — CUP PACEART REMOTE DEVICE CHECK
Battery Remaining Longevity: 115 mo
Battery Remaining Percentage: 92 %
Battery Voltage: 3.02 V
Brady Statistic RV Percent Paced: 3.9 %
Date Time Interrogation Session: 20230717020645
HighPow Impedance: 79 Ohm
Implantable Lead Implant Date: 20230116
Implantable Lead Location: 753860
Implantable Pulse Generator Implant Date: 20230116
Lead Channel Impedance Value: 550 Ohm
Lead Channel Pacing Threshold Amplitude: 0.75 V
Lead Channel Pacing Threshold Pulse Width: 0.5 ms
Lead Channel Sensing Intrinsic Amplitude: 11.8 mV
Lead Channel Setting Pacing Amplitude: 2.5 V
Lead Channel Setting Pacing Pulse Width: 0.5 ms
Lead Channel Setting Sensing Sensitivity: 0.5 mV
Pulse Gen Serial Number: 111046020

## 2021-10-11 ENCOUNTER — Other Ambulatory Visit (HOSPITAL_COMMUNITY): Payer: Self-pay | Admitting: *Deleted

## 2021-10-11 DIAGNOSIS — I5022 Chronic systolic (congestive) heart failure: Secondary | ICD-10-CM

## 2021-10-13 NOTE — Progress Notes (Signed)
Remote ICD transmission.   

## 2021-10-14 ENCOUNTER — Ambulatory Visit (HOSPITAL_BASED_OUTPATIENT_CLINIC_OR_DEPARTMENT_OTHER)
Admission: RE | Admit: 2021-10-14 | Discharge: 2021-10-14 | Disposition: A | Discharge status: Home / Self Care | Payer: Managed Care, Other (non HMO) | Source: Ambulatory Visit | Attending: *Deleted | Admitting: *Deleted

## 2021-10-14 ENCOUNTER — Encounter (HOSPITAL_COMMUNITY): Payer: Self-pay | Admitting: Internal Medicine

## 2021-10-14 ENCOUNTER — Ambulatory Visit (HOSPITAL_COMMUNITY)
Admission: RE | Admit: 2021-10-14 | Discharge: 2021-10-14 | Disposition: A | Discharge status: Home / Self Care | Payer: Managed Care, Other (non HMO) | Source: Ambulatory Visit | Attending: Internal Medicine | Admitting: Internal Medicine

## 2021-10-14 VITALS — BP 120/84 | HR 69 | Ht 69.0 in | Wt 169.6 lb

## 2021-10-14 DIAGNOSIS — I5022 Chronic systolic (congestive) heart failure: Secondary | ICD-10-CM

## 2021-10-14 DIAGNOSIS — Z7984 Long term (current) use of oral hypoglycemic drugs: Secondary | ICD-10-CM | POA: Insufficient documentation

## 2021-10-14 DIAGNOSIS — I110 Hypertensive heart disease with heart failure: Secondary | ICD-10-CM | POA: Diagnosis present

## 2021-10-14 DIAGNOSIS — I2510 Atherosclerotic heart disease of native coronary artery without angina pectoris: Secondary | ICD-10-CM | POA: Diagnosis not present

## 2021-10-14 DIAGNOSIS — I440 Atrioventricular block, first degree: Secondary | ICD-10-CM | POA: Insufficient documentation

## 2021-10-14 DIAGNOSIS — I252 Old myocardial infarction: Secondary | ICD-10-CM | POA: Insufficient documentation

## 2021-10-14 DIAGNOSIS — G4733 Obstructive sleep apnea (adult) (pediatric): Secondary | ICD-10-CM | POA: Insufficient documentation

## 2021-10-14 DIAGNOSIS — Z95810 Presence of automatic (implantable) cardiac defibrillator: Secondary | ICD-10-CM | POA: Diagnosis not present

## 2021-10-14 DIAGNOSIS — Z955 Presence of coronary angioplasty implant and graft: Secondary | ICD-10-CM | POA: Insufficient documentation

## 2021-10-14 LAB — ECHOCARDIOGRAM COMPLETE
Calc EF: 37.1 %
S' Lateral: 4.6 cm
Single Plane A2C EF: 38.8 %
Single Plane A4C EF: 34.5 %

## 2021-10-14 MED ORDER — LOSARTAN POTASSIUM 50 MG PO TABS: 50.0000 mg | ORAL_TABLET | Freq: Every day | ORAL | 6 refills | Status: DC

## 2021-10-14 MED ORDER — PERFLUTREN LIPID MICROSPHERE
1.0000 mL | INTRAVENOUS | Status: DC | PRN
  Administered 2021-10-14: 3 mL via INTRAVENOUS

## 2021-10-14 NOTE — Patient Instructions (Signed)
Increase Losartan to 50 mg Daily  Your physician recommends that you schedule a follow-up appointment in: 1 year, **PLEASE CALL OUR OFFICE IN JUNE 2024 TO SCHEDULE THIS APPOINTMENT  If you have any questions or concerns before your next appointment please send [place] a message through [place] or call our office at [phone].    TO LEAVE A MESSAGE FOR THE NURSE SELECT OPTION 2, PLEASE LEAVE A MESSAGE INCLUDING: YOUR NAME DATE OF BIRTH CALL BACK NUMBER REASON FOR CALL**this is important as we prioritize the call backs  YOU WILL RECEIVE A CALL BACK THE SAME DAY AS LONG AS YOU CALL BEFORE 4:00 PM  At the [facility], you and your health needs are our priority. As part of our continuing mission to provide you with exceptional heart care, we have created designated Provider Care Teams. These Care Teams include your primary Cardiologist (physician) and Advanced Practice Providers (APPs- Physician Assistants and Nurse Practitioners) who all work together to provide you with the care you need, when you need it.   You may see any of the following providers on your designated Care Team at your next follow up: Dr Glori Bickers Dr Haynes Kerns, NP Lyda Jester, [place] [facility] [place], [place] Audry Riles, PharmD   Please be sure to bring in all your medications bottles to every appointment.

## 2021-10-14 NOTE — Progress Notes (Signed)
  Echocardiogram 2D Echocardiogram has been performed.  Michael Terrell 10/14/2021, 3:05 PM

## 2021-10-14 NOTE — Addendum Note (Signed)
Encounter addended by: Scarlette Calico, RN on: 10/14/2021 3:34 PM  Actions taken: Pharmacy for encounter modified, Order list changed, Diagnosis association updated, Clinical Note Signed

## 2021-10-14 NOTE — Progress Notes (Signed)
-  ADVANCED HF CLINIC CONSULT NOTE  Referring Physician: Dr. Ernie Hew Primary Care: Fanny Bien, MD Primary Cardiologist: Dr. Virgina Jock  HPI:  66 y/o male with CAD s/p anterior STEMI (10/19), HTN and OSA referrd by Dr. Virgina Jock for further management of systolic HF.  Had PCI/BMS to LAD in 2011. In 10/19 had anterior stemi with PCI/DES to ostial LAD. Moderate non-obstructive CAD in LCX and RCA  Echo 10/19 EF 25-30% Echo 12/19 EF 35-40% cMRI 1/20: EF 45% Large LAD infarct cMRI 12/22: EF 35% Subendocardial LGE > 50% in entire LAD territory RV 47%   Event monitor 10/20:  373 episodes of sinus pauses >3 seconds.  Longest pause 10 seconds, intermittent first-degree AV block, all during sleep hours, not symptomatic.  Had syncopal episode 6/22 while running. Eventually underwent ICD placement in 1/23 with Dr. Lovena Le  I saw him for the first time in 3/23 for a second opinion on his cardiomyopathy. He had not been on GDMT. Previously stopped losartan due to night sweats. Had rash with lisinopril. Has never been on spiro or SGLT2i.  Wilder Glade started. Referred to HF PharmD for GDMT titration.   CPX 3/23 showed excellent functional capacity but slope was markedly elevated (41)  Here for f/u. Feels good. Exercising regularly. Walking or walking/running. 45-48mns. No CP. Mild DOE. No edema, orthopnea or PND. No problems with meds.   Echo today 10/14/21 EF 35-40% RV ok Personally reviewed   CPX 3/23  FVC 3.49 (82%)      FEV1 2.90 (89%)        MVV 148 (114%)   Peak VO2: 36.9 (127% predicted peak VO2)  VE/VCO2 slope:  41  Peak RER: 1.05  VE/MVV:  88%  O2pulse:  18   (120% predicted O2pulse)  Past Medical History:  Diagnosis Date   Acute anterior wall MI ([place]) 10/2009   CAD (coronary artery disease)    BMS to LAD (2011)   Chest pain    Dyslipidemia    Heart attack ([place]) 11/2017   Hyperlipidemia    Hypertension    Sinus pause    Snoring     Current Outpatient Medications  Medication Sig Dispense Refill   aspirin EC 81 MG tablet Take 81 mg by mouth daily.     azelastine (ASTELIN) 0.1 % nasal spray Place 2 sprays into both nostrils as needed for allergies ([place]).     cetirizine (ZYRTEC) 10 MG tablet Take 10 mg by mouth daily.     Cholecalciferol (VITAMIN D-3 PO) Take 2,500 Units by mouth daily.     dapagliflozin propanediol (FARXIGA) 10 MG TABS tablet Take 1 tablet (10 mg total) by mouth daily before breakfast. 30 tablet 6   Evolocumab (REPATHA SURECLICK) 062 MG/ML SOAJ Inject 140 mg as directed every 14 (fourteen) days.     famotidine (PEPCID) 20 MG tablet Take 20 mg by mouth 2 (two) times daily.     levothyroxine (SYNTHROID, LEVOTHROID) 75 MCG tablet Take 75 mcg by mouth daily before breakfast.     losartan (COZAAR) 25 MG tablet TAKE 1 TABLET BY MOUTH EVERY DAY 30 tablet 3   montelukast (SINGULAIR) 10 MG tablet Take 10 mg by mouth at bedtime as needed (allergies in the spring).     Polyvinyl Alcohol-Povidone (REFRESH OP) Place 3 drops into both eyes 2 (two) times daily.     spironolactone (ALDACTONE) 25 MG tablet Take 1 tablet (25 mg total) by mouth daily. 90 tablet 3   nitroGLYCERIN (NITROSTAT) 0.4 MG SL tablet Place 1 tablet (0.4 mg total) under the tongue every 5 (five) minutes as needed for chest pain. (Patient not taking: Reported on 10/14/2021) 25 tablet 3   No current facility-administered medications for this encounter.   Facility-Administered Medications Ordered in Other Encounters  Medication Dose Route Frequency Provider Last Rate Last Admin   perflutren lipid microspheres (DEFINITY) IV suspension  1-10 mL Intravenous PRN Mare Ludtke, Shaune Pascal, MD   3 mL at 10/14/21 1448    Allergies  Allergen Reactions   Lisinopril Rash   Metoprolol Other (See Comments)    Significant bradycardia      Social History   Socioeconomic History   Marital status: Married    Spouse name: Not on file   Number of children: 2   Years of education: Not on file   Highest education level: Master's degree (e.g., MA, MS, MEng, MEd, MSW, MBA)  Occupational History   Occupation: Nurse, 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[employer]    Employer: [employer]  Tobacco Use   Smoking status: Never   Smokeless tobacco: Never  Vaping Use   Vaping Use: Never used  Substance and Sexual Activity   Alcohol use: No    Alcohol/week: 0.0 standard drinks of alcohol   Drug use: No   Sexual activity: Not on file  Other Topics Concern   Not on file  Social History Narrative   Not on file   Social Determinants of Health   Financial Resource Strain: Low Risk  (02/22/2018)   Overall Financial Resource Strain (CARDIA)    Difficulty of Paying Living Expenses: Not very hard  Food Insecurity: No Food Insecurity (02/22/2018)   Hunger Vital Sign    Worried About Running Out of Food in the Last Year: Never true  Ran Out of Food in the Last Year: Never true  Transportation Needs: No Transportation Needs (02/22/2018)   PRAPARE - Hydrologist (Medical): No    Lack of Transportation (Non-Medical): No  Physical Activity: Insufficiently Active (02/22/2018)   Exercise Vital Sign    Days of Exercise per Week: 4 days    Minutes of Exercise per Session: 30 min  Stress: Stress Concern Present (02/22/2018)   [place]    Feeling of Stress : To some extent  Social Connections: Not on file  Intimate Partner Violence: Not on file      Family History  Problem Relation Age of Onset   CAD Father        MI in his 45s   Heart disease Brother    Heart disease Brother      Vitals:   10/14/21 1457  BP: 120/84  Pulse: 69  SpO2: 95%  Weight: 76.9 kg (169 lb 9.6 oz)  Height: '5\' 9"'$  (1.753 m)     PHYSICAL EXAM: General:  Well appearing. No resp difficulty HEENT: normal Neck: supple. no JVD. Carotids 2+ bilat; no bruits. No lymphadenopathy or thryomegaly appreciated. Cor: PMI nondisplaced. Regular rate & rhythm. No rubs, gallops or murmurs. Lungs: clear Abdomen: soft, nontender, nondistended. No hepatosplenomegaly. No bruits or masses. Good bowel sounds. Extremities: no cyanosis, clubbing, rash, edema Neuro: alert & orientedx3, cranial nerves grossly intact. moves all 4 extremities w/o difficulty. Affect pleasant   ECG: SR 60 with wenckebach +LVH 1AVB (235m)  Personally reviewed   ASSESSMENT & PLAN:   1. Systolic HF due to iCM - s/p anterior STEMI in 10/19 with PCI to ostial LAD  - Echo 12/19 EF 35-40% - cMRI 1/20: EF 45% Large LAD infarct - cMRI 12/22: EF 35% Subendocardial LGE > 50% in entire LAD territory RV 47% - s/p ICD 1/23. ICD interrogated in clinic No VT. ~ 4% RV pacing. Activity level 3-4hr/day  - CPX 3/23: pVO2: 36.9 slope:  41 RER 1.05 VE/MVV:  88% O2pulse:  18  - CPX looks great - NYHA I - Volume status ok  - Continue Farxiga 10  - Increase losartan to 50 daily - Continue spiro 12.5  - No b-blocker due to underlying conduction disease as we do not want to force RV pacing  2. CAD s/p previous anterior STEMI - Had PCI/BMS to LAD in 2011. In 10/19 had anterior stemi with PCI/DES to ostial LAD. Moderate non-obstructive CAD in LCX and RCA - followed by Dr. PVirgina Jock- intolerant of statins - Continue ASA/PCSK-91 - no s/s angina  3. OSA - intolerant of CPAP  DGlori Bickers MD  3:22 PM

## 2021-12-13 ENCOUNTER — Ambulatory Visit (INDEPENDENT_AMBULATORY_CARE_PROVIDER_SITE_OTHER): Payer: Managed Care, Other (non HMO)

## 2021-12-13 DIAGNOSIS — I5022 Chronic systolic (congestive) heart failure: Secondary | ICD-10-CM | POA: Diagnosis not present

## 2021-12-14 LAB — CUP PACEART REMOTE DEVICE CHECK
Battery Remaining Longevity: 111 mo
Battery Remaining Percentage: 90 %
Battery Voltage: 3.02 V
Brady Statistic RV Percent Paced: 3.7 %
Date Time Interrogation Session: 20231016023759
HighPow Impedance: 73 Ohm
Implantable Lead Implant Date: 20230116
Implantable Lead Location: 753860
Implantable Pulse Generator Implant Date: 20230116
Lead Channel Impedance Value: 450 Ohm
Lead Channel Pacing Threshold Amplitude: 0.75 V
Lead Channel Pacing Threshold Pulse Width: 0.5 ms
Lead Channel Sensing Intrinsic Amplitude: 11.8 mV
Lead Channel Setting Pacing Amplitude: 2.5 V
Lead Channel Setting Pacing Pulse Width: 0.5 ms
Lead Channel Setting Sensing Sensitivity: 0.5 mV
Pulse Gen Serial Number: 111046020

## 2022-01-06 NOTE — Progress Notes (Signed)
Remote ICD transmission.   

## 2022-02-14 ENCOUNTER — Other Ambulatory Visit (HOSPITAL_COMMUNITY): Payer: Self-pay | Admitting: Internal Medicine

## 2022-03-04 ENCOUNTER — Encounter: Payer: Self-pay | Admitting: Cardiology

## 2022-03-04 ENCOUNTER — Ambulatory Visit: Payer: Managed Care, Other (non HMO) | Admitting: Cardiology

## 2022-03-04 VITALS — BP 119/81 | HR 79 | Ht 69.0 in | Wt 169.4 lb

## 2022-03-04 DIAGNOSIS — I255 Ischemic cardiomyopathy: Secondary | ICD-10-CM

## 2022-03-04 DIAGNOSIS — I5022 Chronic systolic (congestive) heart failure: Secondary | ICD-10-CM

## 2022-03-04 DIAGNOSIS — I2510 Atherosclerotic heart disease of native coronary artery without angina pectoris: Secondary | ICD-10-CM

## 2022-03-04 MED ORDER — DAPAGLIFLOZIN PROPANEDIOL 10 MG PO TABS: 10.0000 mg | ORAL_TABLET | Freq: Every day | ORAL | 3 refills | Status: DC

## 2022-03-04 MED ORDER — LOSARTAN POTASSIUM 50 MG PO TABS: 50.0000 mg | ORAL_TABLET | Freq: Every day | ORAL | 3 refills | Status: DC

## 2022-03-04 MED ORDER — ASPIRIN 81 MG PO TBEC: 81.0000 mg | DELAYED_RELEASE_TABLET | Freq: Every day | ORAL | 3 refills | Status: AC

## 2022-03-04 MED ORDER — SPIRONOLACTONE 25 MG PO TABS: 25.0000 mg | ORAL_TABLET | Freq: Every day | ORAL | 3 refills | Status: DC

## 2022-03-04 NOTE — Progress Notes (Signed)
Subjective:   Michael Terrell, male    DOB: 1955-03-23, 67 y.o.   MRN: [id number]  Chief complaint:  Coronary artery disease   HPI  67 y/o Caucasian male with hypertension, hyperlipidemia, CAD s/p prior LAD BMS 2011, anterior STEMI on 12/04/2017, s/p PPCI to ostial LAD, ischemic cardiomyopathy with recovered EF, St Jude s/p ICD (02/2021), h/o sinus pauses, OSA-noncompliant with CPAP.  Patient is doing well, denies chest pain, shortness of breath, palpitations, leg edema, orthopnea, PND, TIA/syncope.   Current Outpatient Medications:    aspirin EC 81 MG tablet, Take 81 mg by mouth daily., Disp: , Rfl:    azelastine (ASTELIN) 0.1 % nasal spray, Place 2 sprays into both nostrils as needed for allergies ([place])., Disp: , Rfl:    cetirizine (ZYRTEC) 10 MG tablet, Take 10 mg by mouth daily., Disp: , Rfl:    Cholecalciferol (VITAMIN D-3 PO), Take 2,500 Units by mouth daily., Disp: , Rfl:    Evolocumab (REPATHA SURECLICK) 466 MG/ML SOAJ, Inject 140 mg as directed every 14 (fourteen) days., Disp: , Rfl:    famotidine (PEPCID) 20 MG tablet, Take 20 mg by mouth 2 (two) times daily., Disp: , Rfl:    FARXIGA 10 MG TABS tablet, TAKE 1 TABLET BY MOUTH DAILY BEFORE BREAKFAST, Disp: 30 tablet, Rfl: 6   levothyroxine (SYNTHROID, LEVOTHROID) 75 MCG tablet, Take 75 mcg by mouth daily before breakfast., Disp: , Rfl:    losartan (COZAAR) 50 MG tablet, Take 1 tablet (50 mg total) by mouth daily., Disp: 30 tablet, Rfl: 6   montelukast (SINGULAIR) 10 MG tablet, Take 10 mg by mouth at bedtime as needed (allergies in the spring)., Disp: , Rfl:    nitroGLYCERIN (NITROSTAT) 0.4 MG SL tablet, Place 1 tablet (0.4 mg total) under the tongue every 5 (five) minutes as needed for chest pain., Disp: 25 tablet, Rfl: 3   Polyvinyl Alcohol-Povidone (REFRESH OP), Place 3 drops into both eyes 2 (two) times daily., Disp: , Rfl:    spironolactone (ALDACTONE) 25 MG tablet, Take 1 tablet (25 mg total) by mouth daily., Disp:  90 tablet, Rfl: 3  Cardiovascular studies:  Echocardiogram 09/2021:  1. Left ventricular ejection fraction, by estimation, is 35 to 40%. The  left ventricle has moderately decreased function. The left ventricle  demonstrates regional wall motion abnormalities (see scoring  diagram/findings for description). The left  ventricular internal cavity size was mildly dilated. Left ventricular  diastolic parameters are consistent with Grade I diastolic dysfunction  (impaired relaxation). There is hypokinesis of the left ventricular,  mid-apical septal wall, anteroseptal wall  and inferoseptal wall.   2. Right ventricular systolic function is normal. The right ventricular  size is normal.   3. Right atrial size was mildly dilated.   4. The mitral valve is normal in structure. No evidence of mitral valve  regurgitation. No evidence of mitral stenosis.   5. The aortic valve is tricuspid. There is mild calcification of the  aortic valve. Aortic valve regurgitation is not visualized. No aortic  stenosis is present.   6. The inferior vena cava is normal in size with greater than 50%  respiratory variability, suggesting right atrial pressure of 3 mmHg.   EKG 03/04/2022: Sinus rhythm 70 bpm First degree A-V block  Poor R-wave progression Nonspecific T-abnormality  CPEX 05/19/2021: Exercise testing with gas exchange demonstrates normal(excellent) functional capacity when compared to matched sedentary norms. Markedly elevated VE/VCO2 slope suggests at least a mild HF limitation with mild to moderate hyperventilation  likely driving the VE/VCO2 slope even further. Patient was achieving ventilatory limits but also demonstrates at least a mild limitation due to HF, even considering excellent PVO2.   St Jude ICD placement (Dr. Lovena Le) 03/15/2021  CMRI 02/09/2022: 1. Subendocardial late gadolinium enhancement consistent with prior infarct in LV basal anteroseptal, mid anteroseptal/anterior,  apical septal/anterior walls, and apex. LGE is greater than 50% transmural suggesting nonviability in this territory.   2.  Mild LV dilatation with moderate systolic dysfunction (EF 64%)   3.  Normal RV size and low normal systolic function (EF 40%)    Cath 12/04/2017: LM: Normal 100% ostial LAD occlusion, successful PPI with Resolute Onyx DES 3.0 X 38 mm Moderate nonobstructive disease in RCA and LCx.  Recent labs: 09/29/2021: Glucose 90, BUN/Cr 20/1.2. EGFR 66. Na/K 138/4.8. Rest of the CMP normal H/H 14/42. MCV 98. Platelets 202 Chol 109, TG 119, HDL 49, LDL 36 TSH 2.2 normal  Review of Systems  Cardiovascular:  Negative for chest pain, dyspnea on exertion, leg swelling, palpitations and syncope.         Vitals:   03/04/22 1249  BP: 119/81  Pulse: 79  SpO2: 97%     Objective:    Physical Exam Vitals and nursing note reviewed.  Constitutional:      General: He is not in acute distress. Neck:     Vascular: No JVD.  Cardiovascular:     Rate and Rhythm: Normal rate and regular rhythm.     Heart sounds: Normal heart sounds. No murmur heard. Pulmonary:     Effort: Pulmonary effort is normal.     Breath sounds: Normal breath sounds. No wheezing or rales.  Musculoskeletal:     Right lower leg: No edema.     Left lower leg: No edema.       ICD-10-CM   1. Chronic systolic heart failure (HCC)  I50.22 EKG 12-Lead    dapagliflozin propanediol (FARXIGA) 10 MG TABS tablet    spironolactone (ALDACTONE) 25 MG tablet    losartan (COZAAR) 50 MG tablet    aspirin EC 81 MG tablet      Orders Placed This Encounter  Procedures   EKG 12-Lead    Meds ordered this encounter  Medications   dapagliflozin propanediol (FARXIGA) 10 MG TABS tablet    Sig: Take 1 tablet (10 mg total) by mouth daily before breakfast.    Dispense:  90 tablet    Refill:  3   spironolactone (ALDACTONE) 25 MG tablet    Sig: Take 1 tablet (25 mg total) by mouth daily.    Dispense:  90 tablet     Refill:  3   losartan (COZAAR) 50 MG tablet    Sig: Take 1 tablet (50 mg total) by mouth daily.    Dispense:  90 tablet    Refill:  3    Please cancel all previous orders for current medication. Change in dosage or pill size.   aspirin EC 81 MG tablet    Sig: Take 1 tablet (81 mg total) by mouth daily. Swallow whole.    Dispense:  90 tablet    Refill:  3         Assessment & Recommendations:    67 y/o Caucasian male with hypertension, hyperlipidemia, CAD s/p prior LAD BMS 2011, anterior STEMI on 12/04/2017, s/p PPCI to ostial LAD, ischemic cardiomyopathy with recovered EF, St Jude s/p ICD (02/2021), h/o sinus pauses, OSA-noncompliant with CPAP  Ischemic cardiomyopathy: EF 35% (CMRI 01/2021). LAD  infarct with no viability. NYHA class II. CPX with at least mild HF component. Now s/p St Jude ICD (02/2021). We will monitor ICD from now onwards.  Allergic to lisinopril, thus unable to use Entresto. Not on beta blocker due to sinus pauses Continue Farxiga 10 mg, losartan 25 mg daily, spiromnolactone 25 mg daily. Medications refilled today. Repeat echocardiogram in 01/2023.   CAD: Coronary artery disease s/p ostial LAD PCI for anterior wall MI in 11/2017. Continue Aspirin 81 mg daily.  Continue Repatha given his statin tolerance.  Chol 109, TG 119, HDL 49, LDL 36 (09/2021). Rest of the maangement as above.  OSA: Emphazied compliance.  F/u in 1 year    Nigel Mormon, MD Pager: [phone] Office: [phone]

## 2022-03-14 ENCOUNTER — Ambulatory Visit: Payer: Managed Care, Other (non HMO)

## 2022-03-14 DIAGNOSIS — I255 Ischemic cardiomyopathy: Secondary | ICD-10-CM

## 2022-03-14 DIAGNOSIS — I5022 Chronic systolic (congestive) heart failure: Secondary | ICD-10-CM

## 2022-03-15 LAB — CUP PACEART REMOTE DEVICE CHECK
Battery Remaining Longevity: 108 mo
Battery Remaining Percentage: 88 %
Battery Voltage: 3.01 V
Brady Statistic RV Percent Paced: 2.9 %
Date Time Interrogation Session: 20240115010733
HighPow Impedance: 75 Ohm
Implantable Lead Connection Status: 753985
Implantable Lead Implant Date: 20230116
Implantable Lead Location: 753860
Implantable Pulse Generator Implant Date: 20230116
Lead Channel Impedance Value: 380 Ohm
Lead Channel Pacing Threshold Amplitude: 0.75 V
Lead Channel Pacing Threshold Pulse Width: 0.5 ms
Lead Channel Sensing Intrinsic Amplitude: 11.8 mV
Lead Channel Setting Pacing Amplitude: 2.5 V
Lead Channel Setting Pacing Pulse Width: 0.5 ms
Lead Channel Setting Sensing Sensitivity: 0.5 mV
Pulse Gen Serial Number: 111046020

## 2022-05-04 ENCOUNTER — Other Ambulatory Visit (HOSPITAL_COMMUNITY): Payer: Self-pay | Admitting: Internal Medicine

## 2022-05-04 DIAGNOSIS — I5022 Chronic systolic (congestive) heart failure: Secondary | ICD-10-CM

## 2022-05-04 NOTE — Progress Notes (Signed)
Remote ICD transmission.   

## 2022-06-13 ENCOUNTER — Ambulatory Visit (INDEPENDENT_AMBULATORY_CARE_PROVIDER_SITE_OTHER): Payer: Managed Care, Other (non HMO)

## 2022-06-13 DIAGNOSIS — I255 Ischemic cardiomyopathy: Secondary | ICD-10-CM

## 2022-06-13 DIAGNOSIS — I441 Atrioventricular block, second degree: Secondary | ICD-10-CM | POA: Diagnosis not present

## 2022-06-13 DIAGNOSIS — Z95810 Presence of automatic (implantable) cardiac defibrillator: Secondary | ICD-10-CM | POA: Diagnosis not present

## 2022-06-13 DIAGNOSIS — Z4502 Encounter for adjustment and management of automatic implantable cardiac defibrillator: Secondary | ICD-10-CM | POA: Diagnosis not present

## 2022-06-14 LAB — CUP PACEART REMOTE DEVICE CHECK
Battery Remaining Longevity: 106 mo
Battery Remaining Percentage: 86 %
Battery Voltage: 3.01 V
Brady Statistic RV Percent Paced: 2.5 %
Date Time Interrogation Session: 20240415032449
HighPow Impedance: 70 Ohm
Implantable Lead Connection Status: 753985
Implantable Lead Implant Date: 20230116
Implantable Lead Location: 753860
Implantable Pulse Generator Implant Date: 20230116
Lead Channel Impedance Value: 360 Ohm
Lead Channel Pacing Threshold Amplitude: 0.75 V
Lead Channel Pacing Threshold Pulse Width: 0.5 ms
Lead Channel Sensing Intrinsic Amplitude: 11.8 mV
Lead Channel Setting Pacing Amplitude: 2.5 V
Lead Channel Setting Pacing Pulse Width: 0.5 ms
Lead Channel Setting Sensing Sensitivity: 0.5 mV
Pulse Gen Serial Number: 111046020

## 2022-07-01 ENCOUNTER — Encounter: Payer: Self-pay | Admitting: Cardiology

## 2022-07-20 NOTE — Progress Notes (Signed)
Remote ICD transmission.   

## 2022-07-21 ENCOUNTER — Encounter: Payer: Self-pay | Admitting: Cardiology

## 2022-09-12 DIAGNOSIS — Z4502 Encounter for adjustment and management of automatic implantable cardiac defibrillator: Secondary | ICD-10-CM | POA: Diagnosis not present

## 2022-09-12 DIAGNOSIS — I441 Atrioventricular block, second degree: Secondary | ICD-10-CM | POA: Diagnosis not present

## 2022-09-12 DIAGNOSIS — I255 Ischemic cardiomyopathy: Secondary | ICD-10-CM | POA: Diagnosis not present

## 2022-09-12 DIAGNOSIS — Z95810 Presence of automatic (implantable) cardiac defibrillator: Secondary | ICD-10-CM | POA: Diagnosis not present

## 2022-09-19 DIAGNOSIS — I441 Atrioventricular block, second degree: Secondary | ICD-10-CM | POA: Diagnosis not present

## 2022-09-19 DIAGNOSIS — Z95810 Presence of automatic (implantable) cardiac defibrillator: Secondary | ICD-10-CM | POA: Diagnosis not present

## 2022-09-19 DIAGNOSIS — Z4502 Encounter for adjustment and management of automatic implantable cardiac defibrillator: Secondary | ICD-10-CM | POA: Diagnosis not present

## 2022-09-19 DIAGNOSIS — I255 Ischemic cardiomyopathy: Secondary | ICD-10-CM | POA: Diagnosis not present

## 2022-10-26 DIAGNOSIS — Z125 Encounter for screening for malignant neoplasm of prostate: Secondary | ICD-10-CM | POA: Diagnosis not present

## 2022-10-26 DIAGNOSIS — Z13220 Encounter for screening for lipoid disorders: Secondary | ICD-10-CM | POA: Diagnosis not present

## 2022-10-26 DIAGNOSIS — E782 Mixed hyperlipidemia: Secondary | ICD-10-CM | POA: Diagnosis not present

## 2022-10-26 DIAGNOSIS — Z0000 Encounter for general adult medical examination without abnormal findings: Secondary | ICD-10-CM | POA: Diagnosis not present

## 2022-11-02 DIAGNOSIS — M25561 Pain in right knee: Secondary | ICD-10-CM | POA: Diagnosis not present

## 2022-11-02 DIAGNOSIS — Z23 Encounter for immunization: Secondary | ICD-10-CM | POA: Diagnosis not present

## 2022-11-02 DIAGNOSIS — R42 Dizziness and giddiness: Secondary | ICD-10-CM | POA: Diagnosis not present

## 2022-11-02 DIAGNOSIS — M170 Bilateral primary osteoarthritis of knee: Secondary | ICD-10-CM | POA: Diagnosis not present

## 2022-11-02 DIAGNOSIS — I951 Orthostatic hypotension: Secondary | ICD-10-CM | POA: Diagnosis not present

## 2022-11-02 DIAGNOSIS — Z0000 Encounter for general adult medical examination without abnormal findings: Secondary | ICD-10-CM | POA: Diagnosis not present

## 2022-11-02 DIAGNOSIS — M25562 Pain in left knee: Secondary | ICD-10-CM | POA: Diagnosis not present

## 2022-11-09 ENCOUNTER — Other Ambulatory Visit: Payer: Self-pay

## 2022-11-09 DIAGNOSIS — I255 Ischemic cardiomyopathy: Secondary | ICD-10-CM

## 2022-11-09 DIAGNOSIS — I5022 Chronic systolic (congestive) heart failure: Secondary | ICD-10-CM

## 2022-11-17 NOTE — Telephone Encounter (Signed)
Error message

## 2022-12-12 ENCOUNTER — Ambulatory Visit (INDEPENDENT_AMBULATORY_CARE_PROVIDER_SITE_OTHER): Payer: Managed Care, Other (non HMO)

## 2022-12-12 DIAGNOSIS — I255 Ischemic cardiomyopathy: Secondary | ICD-10-CM

## 2022-12-12 DIAGNOSIS — I5022 Chronic systolic (congestive) heart failure: Secondary | ICD-10-CM

## 2022-12-14 DIAGNOSIS — J309 Allergic rhinitis, unspecified: Secondary | ICD-10-CM | POA: Diagnosis not present

## 2022-12-14 DIAGNOSIS — K219 Gastro-esophageal reflux disease without esophagitis: Secondary | ICD-10-CM | POA: Diagnosis not present

## 2022-12-14 DIAGNOSIS — T82855S Stenosis of coronary artery stent, sequela: Secondary | ICD-10-CM | POA: Diagnosis not present

## 2022-12-14 DIAGNOSIS — I10 Essential (primary) hypertension: Secondary | ICD-10-CM | POA: Diagnosis not present

## 2022-12-14 LAB — CUP PACEART REMOTE DEVICE CHECK
Battery Remaining Longevity: 100 mo
Battery Remaining Percentage: 82 %
Battery Voltage: 3.01 V
Brady Statistic RV Percent Paced: 2.2 %
Date Time Interrogation Session: 20241014020613
HighPow Impedance: 69 Ohm
Implantable Lead Connection Status: 753985
Implantable Lead Implant Date: 20230116
Implantable Lead Location: 753860
Implantable Pulse Generator Implant Date: 20230116
Lead Channel Impedance Value: 360 Ohm
Lead Channel Pacing Threshold Amplitude: 0.75 V
Lead Channel Pacing Threshold Pulse Width: 0.5 ms
Lead Channel Sensing Intrinsic Amplitude: 11.8 mV
Lead Channel Setting Pacing Amplitude: 2.5 V
Lead Channel Setting Pacing Pulse Width: 0.5 ms
Lead Channel Setting Sensing Sensitivity: 0.5 mV
Pulse Gen Serial Number: 111046020

## 2022-12-28 NOTE — Progress Notes (Signed)
Remote ICD transmission.   

## 2023-01-24 ENCOUNTER — Other Ambulatory Visit (HOSPITAL_COMMUNITY): Payer: Self-pay | Admitting: Internal Medicine

## 2023-01-24 DIAGNOSIS — I5022 Chronic systolic (congestive) heart failure: Secondary | ICD-10-CM

## 2023-02-15 ENCOUNTER — Other Ambulatory Visit: Payer: Managed Care, Other (non HMO)

## 2023-02-15 ENCOUNTER — Ambulatory Visit (HOSPITAL_COMMUNITY): Payer: BC Managed Care – PPO | Attending: Cardiology

## 2023-02-15 DIAGNOSIS — I255 Ischemic cardiomyopathy: Secondary | ICD-10-CM

## 2023-02-15 DIAGNOSIS — I5022 Chronic systolic (congestive) heart failure: Secondary | ICD-10-CM | POA: Diagnosis not present

## 2023-02-15 LAB — ECHOCARDIOGRAM COMPLETE
Area-P 1/2: 6.02 cm2
S' Lateral: 4.4 cm

## 2023-02-15 NOTE — Progress Notes (Signed)
Echocardiogram reveals known old anterior wall myocardial infarction, no significant change.  Follow-up with Dr. Rosemary Holms.

## 2023-03-02 ENCOUNTER — Ambulatory Visit: Payer: Self-pay | Admitting: Cardiology

## 2023-03-06 ENCOUNTER — Other Ambulatory Visit (HOSPITAL_COMMUNITY): Payer: Self-pay | Admitting: Internal Medicine

## 2023-03-06 DIAGNOSIS — I5022 Chronic systolic (congestive) heart failure: Secondary | ICD-10-CM

## 2023-03-08 ENCOUNTER — Encounter: Payer: Self-pay | Admitting: Cardiology

## 2023-03-08 ENCOUNTER — Ambulatory Visit: Payer: BC Managed Care – PPO | Attending: Cardiology | Admitting: Cardiology

## 2023-03-08 VITALS — BP 115/78 | HR 80 | Resp 16 | Ht 69.0 in | Wt 170.0 lb

## 2023-03-08 DIAGNOSIS — I455 Other specified heart block: Secondary | ICD-10-CM | POA: Diagnosis not present

## 2023-03-08 DIAGNOSIS — G4733 Obstructive sleep apnea (adult) (pediatric): Secondary | ICD-10-CM

## 2023-03-08 DIAGNOSIS — Z95810 Presence of automatic (implantable) cardiac defibrillator: Secondary | ICD-10-CM

## 2023-03-08 DIAGNOSIS — R0683 Snoring: Secondary | ICD-10-CM | POA: Diagnosis not present

## 2023-03-08 DIAGNOSIS — I2510 Atherosclerotic heart disease of native coronary artery without angina pectoris: Secondary | ICD-10-CM | POA: Diagnosis not present

## 2023-03-08 DIAGNOSIS — G4731 Primary central sleep apnea: Secondary | ICD-10-CM

## 2023-03-08 DIAGNOSIS — I5020 Unspecified systolic (congestive) heart failure: Secondary | ICD-10-CM

## 2023-03-08 MED ORDER — NITROGLYCERIN 0.4 MG SL SUBL: 0.4000 mg | SUBLINGUAL_TABLET | SUBLINGUAL | 3 refills | Status: AC | PRN

## 2023-03-08 NOTE — Progress Notes (Signed)
 Cardiology Office Note:  .   Date:  03/08/2023  ID:  Michael Terrell, DOB 06/02/1955, MRN [id number] PCP: Waylan Almarie SAUNDERS, MD  [facility] Providers Cardiologist:  Newman Lawrence, MD PCP: Waylan Almarie SAUNDERS, MD  Chief Complaint  Patient presents with   Chronic systolic heart failure   Coronary Artery Disease   Follow-up      History of Present Illness: .    Michael Terrell is a 68 y.o. male with hypertension, hyperlipidemia, CAD s/p prior LAD BMS 2011, anterior STEMI on 12/04/2017, s/p PPCI to ostial LAD, ischemic cardiomyopathy with recovered EF, St Jude s/p ICD (02/2021), h/o sinus pauses, OSA-noncompliant with CPAP.   Patient is doing well.  He walks 5 times a week without recurrence of chest pain, shortness of breath.  He also denies orthopnea, PND, regular symptoms.  Reviewed recent echocardiogram results with the patient, details below.  Also reviewed recent lab results.  Vitals:   03/08/23 0837  BP: 115/78  Pulse: 80  Resp: 16  SpO2: 96%     ROS:  Review of Systems  Cardiovascular:  Negative for chest pain, dyspnea on exertion, leg swelling, palpitations and syncope.     Studies Reviewed: SABRA        EKG 03/08/2023: Sinus rhythm with 1st degree A-V block Left axis deviation When compared with ECG of 17-May-2021 12:02, No significant change since    Independently interpreted 09/2022: Chol 92, TG 91, HDL 49, LDL 26 Hb 14.4 Cr 1.1  Echocardiogram 02/15/2023: Mild LV dilatation. Hypokinesis of the anteroseptal/inferoseptal, anterior wall from base  to apex. EF 35-40%.Grade 1 DD. Mild RV systolic dysfunction.   Physical Exam:   Physical Exam Vitals and nursing note reviewed.  Constitutional:      General: He is not in acute distress. Neck:     Vascular: No JVD.  Cardiovascular:     Rate and Rhythm: Normal rate and regular rhythm.     Heart sounds: Normal heart sounds. No murmur heard. Pulmonary:     Effort: Pulmonary effort is  normal.     Breath sounds: Normal breath sounds. No wheezing or rales.  Musculoskeletal:     Right lower leg: No edema.     Left lower leg: No edema.      VISIT DIAGNOSES:   ICD-10-CM   1. CAD in native artery  I25.10 EKG 12-Lead       ASSESSMENT AND PLAN: .    Michael Terrell is a 68 y.o. male with hypertension, hyperlipidemia, CAD s/p prior LAD BMS 2011, anterior STEMI on 12/04/2017, s/p PPCI to ostial LAD, ischemic cardiomyopathy with recovered EF, St Jude s/p ICD (02/2021), h/o sinus pauses, OSA-noncompliant with CPAP   Ischemic cardiomyopathy: EF 35% (CMRI 01/2021). LAD infarct with no viability. NYHA class II. CPX with at least mild HF component. Now s/p St Jude ICD (02/2021).  No alerts. Allergic to lisinopril, thus unable to use Entresto. Not on beta blocker due to sinus pauses Continue Farxiga  10 mg, losartan  25 mg daily, spironolactone  25 mg daily. While his ejection fraction has not improved, he remains very well compensated, without any symptoms.  CAD: Coronary artery disease s/p ostial LAD PCI for anterior wall MI in 11/2017. Continue Aspirin  81 mg daily.  Continue Repatha given his statin tolerance.  Lipids well controlled. Rest of the maangement as above.   OSA: Previously saw before neurology 5 years ago. He is currently not using CPAP, was not comfortable with the full facemask.  He is open to idea of nasal mask.  I will refer him to Dr. Shlomo for repeat sleep study .    Meds ordered this encounter  Medications   nitroGLYCERIN  (NITROSTAT ) 0.4 MG SL tablet    Sig: Place 1 tablet (0.4 mg total) under the tongue every 5 (five) minutes as needed for chest pain.    Dispense:  25 tablet    Refill:  3     F/u in 1 year  Signed, Newman JINNY Lawrence, MD

## 2023-03-08 NOTE — Patient Instructions (Signed)
 Medication Instructions:   Your physician recommends that you continue on your current medications as directed. Please refer to the Current Medication list given to you today.  *If you need a refill on your cardiac medications before your next appointment, please call your pharmacy*   You have been referred to DR. TRACI TURNER HERE IN OUR OFFICE FOR MANAGEMENT OF YOUR SLEEP APNEA AND SLEEP SUPPLIES--YOU WILL GET A CALL BACK FROM HER SCHEDULER TO ARRANGE THIS APPOINTMENT    Testing/Procedures:  Your physician has requested that you have an echocardiogram. Echocardiography is a painless test that uses sound waves to create images of your heart. It provides your doctor with information about the size and shape of your heart and how well your heart's chambers and valves are working. This procedure takes approximately one hour. There are no restrictions for this procedure.  ECHO IN ONE YEAR PER DR. PATWARDHAN  Please do NOT wear cologne, perfume, aftershave, or lotions (deodorant is allowed). Please arrive 15 minutes prior to your appointment time.  Please note: We ask at that you not bring children with you during ultrasound (echo/ vascular) testing. Due to room size and safety concerns, children are not allowed in the ultrasound rooms during exams. Our front office staff cannot provide observation of children in our lobby area while testing is being conducted. An adult accompanying a patient to their appointment will only be allowed in the ultrasound room at the discretion of the ultrasound technician under special circumstances. We apologize for any inconvenience.    Follow-Up:  1.)  NEEDS IN-PERSON ICD CHECK WITH OUR EP DEPARTMENT/DOCTOR--PATIENT WAS DUE TO SEE DR. WADDELL BACK IN APRIL 2024  2.)  ONE YEAR WITH DR. PATWARDHAN

## 2023-03-13 ENCOUNTER — Ambulatory Visit (INDEPENDENT_AMBULATORY_CARE_PROVIDER_SITE_OTHER): Payer: BC Managed Care – PPO

## 2023-03-13 DIAGNOSIS — I5022 Chronic systolic (congestive) heart failure: Secondary | ICD-10-CM

## 2023-03-13 DIAGNOSIS — I255 Ischemic cardiomyopathy: Secondary | ICD-10-CM | POA: Diagnosis not present

## 2023-03-14 LAB — CUP PACEART REMOTE DEVICE CHECK
Battery Remaining Longevity: 98 mo
Battery Remaining Percentage: 80 %
Battery Voltage: 3.01 V
Brady Statistic RV Percent Paced: 2.2 %
Date Time Interrogation Session: 20250113010007
HighPow Impedance: 71 Ohm
Implantable Lead Connection Status: 753985
Implantable Lead Implant Date: 20230116
Implantable Lead Location: 753860
Implantable Pulse Generator Implant Date: 20230116
Lead Channel Impedance Value: 380 Ohm
Lead Channel Pacing Threshold Amplitude: 0.75 V
Lead Channel Pacing Threshold Pulse Width: 0.5 ms
Lead Channel Sensing Intrinsic Amplitude: 11.8 mV
Lead Channel Setting Pacing Amplitude: 2.5 V
Lead Channel Setting Pacing Pulse Width: 0.5 ms
Lead Channel Setting Sensing Sensitivity: 0.5 mV
Pulse Gen Serial Number: 111046020

## 2023-03-16 ENCOUNTER — Telehealth: Payer: Self-pay | Admitting: *Deleted

## 2023-03-16 DIAGNOSIS — I10 Essential (primary) hypertension: Secondary | ICD-10-CM | POA: Diagnosis not present

## 2023-03-16 DIAGNOSIS — M170 Bilateral primary osteoarthritis of knee: Secondary | ICD-10-CM | POA: Diagnosis not present

## 2023-03-16 DIAGNOSIS — K219 Gastro-esophageal reflux disease without esophagitis: Secondary | ICD-10-CM | POA: Diagnosis not present

## 2023-03-16 DIAGNOSIS — I429 Cardiomyopathy, unspecified: Secondary | ICD-10-CM | POA: Diagnosis not present

## 2023-03-16 NOTE — Telephone Encounter (Signed)
-----   Message from Nurse Alcario Drought E sent at 03/16/2023  4:01 PM EST ----- Regarding: RE: NEEDS TO SEE DR. Mayford Knife FOR SLEEP MANAGMENT--KNOWN OSA [place], It looks like Tacey Ruiz tried to call him on 03/09/23. He may also need to get [place] old records from his neurologist before he can be scheduled. Alcario Drought ----- Message ----- From: Loa Socks, LPN Sent: 03/05/1094   9:08 AM EST To: Reesa Chew, CMA; Lorelle Formosa Via, LPN; # Subject: NEEDS TO SEE DR. Mayford Knife FOR SLEEP MANAGMENT-#  Dr. Rosemary Holms saw this pt in clinic this morning and referred him to Dr. Mayford Knife to manage his OSA  Pt has known OSA with current sleep supplies.    Referral placed  Pt aware you will call to schedule an appointment in person with Dr. Mayford Knife for sleep management  Can you please call and shoot me the date thereafter?   Thanks [employer]

## 2023-03-22 NOTE — Progress Notes (Unsigned)
  Electrophysiology Office Note:   ID:  Michael Terrell, DOB [birth year], MRN [id number]  Primary Cardiologist: None Electrophysiologist: Lewayne Bunting, MD  {Click to update primary MD,subspecialty MD or APP then REFRESH:1}    History of Present Illness:   Michael Terrell is a 68 y.o. male with h/o CAD, symptomatic brady, h/o syncope, s/p ICD seen today for routine electrophysiology followup.   Since last being seen in our clinic the patient reports doing ***.  he denies chest pain, palpitations, dyspnea, PND, orthopnea, nausea, vomiting, dizziness, syncope, edema, weight gain, or early satiety.   Review of systems complete and found to be negative unless listed in HPI.   EP Information / Studies Reviewed:    EKG is not ordered today. EKG from 03/08/2023 reviewed which showed NSR at 78 bpm       ICD Interrogation-  reviewed in detail today,  See PACEART report.  Arrhythmia/Device History Abbott Single Chamber ICD implanted 02/2021 for ICM, CHF   Physical Exam:   VS:  There were no vitals taken for this visit.   Wt Readings from Last 3 Encounters:  03/08/23 170 lb (77.1 kg)  03/04/22 169 lb 6.4 oz (76.8 kg)  10/14/21 169 lb 9.6 oz (76.9 kg)     GEN: No acute distress *** NECK: No JVD; No carotid bruits CARDIAC: {EPRHYTHM:28826}, no murmurs, rubs, gallops RESPIRATORY:  Clear to auscultation without rales, wheezing or rhonchi  ABDOMEN: Soft, non-tender, non-distended EXTREMITIES:  {EDEMA LEVEL:28147::"No"} edema; No deformity   ASSESSMENT AND PLAN:    Chronic systolic CHF  s/p Abbott single chamber ICD  euvolemic today Stable on an appropriate medical regimen Normal ICD function See Pace Art report No changes today  ICM CAD Denies s/s ischemia  Disposition:   Follow up with {EPPROVIDERS:28135} {EPFOLLOW UP:28173}   Signed, Graciella Freer, PA-C

## 2023-03-23 ENCOUNTER — Ambulatory Visit: Payer: BC Managed Care – PPO | Attending: Student | Admitting: Student

## 2023-03-23 ENCOUNTER — Encounter: Payer: Self-pay | Admitting: Student

## 2023-03-23 VITALS — BP 114/74 | HR 72 | Ht 69.0 in | Wt 172.0 lb

## 2023-03-23 DIAGNOSIS — I5020 Unspecified systolic (congestive) heart failure: Secondary | ICD-10-CM

## 2023-03-23 DIAGNOSIS — I2510 Atherosclerotic heart disease of native coronary artery without angina pectoris: Secondary | ICD-10-CM

## 2023-03-23 DIAGNOSIS — I455 Other specified heart block: Secondary | ICD-10-CM | POA: Diagnosis not present

## 2023-03-23 DIAGNOSIS — I255 Ischemic cardiomyopathy: Secondary | ICD-10-CM

## 2023-03-23 DIAGNOSIS — I5022 Chronic systolic (congestive) heart failure: Secondary | ICD-10-CM

## 2023-03-23 DIAGNOSIS — Z95810 Presence of automatic (implantable) cardiac defibrillator: Secondary | ICD-10-CM

## 2023-03-23 LAB — CUP PACEART INCLINIC DEVICE CHECK
Battery Remaining Longevity: 100 mo
Brady Statistic RV Percent Paced: 2.2 %
Date Time Interrogation Session: 20250123100923
HighPow Impedance: 73.125
Implantable Lead Connection Status: 753985
Implantable Lead Implant Date: 20230116
Implantable Lead Location: 753860
Implantable Pulse Generator Implant Date: 20230116
Lead Channel Impedance Value: 437.5 Ohm
Lead Channel Pacing Threshold Amplitude: 0.5 V
Lead Channel Pacing Threshold Amplitude: 0.5 V
Lead Channel Pacing Threshold Pulse Width: 0.5 ms
Lead Channel Pacing Threshold Pulse Width: 0.5 ms
Lead Channel Sensing Intrinsic Amplitude: 11.8 mV
Lead Channel Setting Pacing Amplitude: 2.5 V
Lead Channel Setting Pacing Pulse Width: 0.5 ms
Lead Channel Setting Sensing Sensitivity: 0.5 mV
Pulse Gen Serial Number: 111046020

## 2023-03-23 LAB — BASIC METABOLIC PANEL
BUN/Creatinine Ratio: 14 (ref 10–24)
BUN: 15 mg/dL (ref 8–27)
CO2: 25 mmol/L (ref 20–29)
Calcium: 9.5 mg/dL (ref 8.6–10.2)
Chloride: 100 mmol/L (ref 96–106)
Creatinine, Ser: 1.09 mg/dL (ref 0.76–1.27)
Glucose: 109 mg/dL — ABNORMAL HIGH (ref 70–99)
Potassium: 4.9 mmol/L (ref 3.5–5.2)
Sodium: 137 mmol/L (ref 134–144)
eGFR: 74 mL/min/{1.73_m2} (ref >59)

## 2023-03-23 NOTE — Patient Instructions (Signed)
Medication Instructions:  Your physician recommends that you continue on your current medications as directed. Please refer to the Current Medication list given to you today.  *If you need a refill on your cardiac medications before your next appointment, please call your pharmacy*  Lab Work: BMET-TODAY If you have labs (blood work) drawn today and your tests are completely normal, you will receive your results only by: MyChart Message (if you have MyChart) OR A paper copy in the mail If you have any lab test that is abnormal or we need to change your treatment, we will call you to review the results.  Follow-Up: At [facility], you and your health needs are our priority.  As part of our continuing mission to provide you with exceptional heart care, we have created designated Provider Care Teams.  These Care Teams include your primary Cardiologist (physician) and Advanced Practice Providers (APPs -  Physician Assistants and Nurse Practitioners) who all work together to provide you with the care you need, when you need it.  Your next appointment:   1 year(s)  Provider:   Casimiro Needle "Otilio Saber, PA-C

## 2023-03-27 ENCOUNTER — Encounter: Payer: Self-pay | Admitting: Cardiology

## 2023-03-27 ENCOUNTER — Telehealth: Payer: Self-pay | Admitting: *Deleted

## 2023-03-27 NOTE — Telephone Encounter (Signed)
-----   Message from [place] [facility] sent at 03/27/2023  1:55 PM EST ----- Regarding: RE: NEEDS TO SEE DR. Mayford Knife FOR SLEEP MANAGMENT--KNOWN OSA Creta Levin and Lajoyce Corners,  Patient has been contacted 3x with no success. I will send him a letter. ----- Message ----- From: Luellen Pucker, RN Sent: 03/16/2023   4:01 PM EST To: Garald Braver Subject: FW: NEEDS TO SEE DR. Mayford Knife FOR SLEEP MANAGM#  Hey just an FYI, I do see some old sleep studies in epic. If you call this guy to schedule him, I would ask if his neurologist is who was managing his sleep apnea before and let him know we need his most recent notes from that MD. I think it was Huston Foley at [facility]. Alcario Drought, RN ----- Message ----- From: Loa Socks, LPN Sent: 4/0/9811   9:08 AM EST To: Reesa Chew, CMA; Lorelle Formosa Via, LPN; # Subject: NEEDS TO SEE DR. Mayford Knife FOR SLEEP MANAGMENT-#  Dr. Rosemary Holms saw this pt in clinic this morning and referred him to Dr. Mayford Knife to manage his OSA  Pt has known OSA with current sleep supplies.    Referral placed  Pt aware you will call to schedule an appointment in person with Dr. Mayford Knife for sleep management  Can you please call and shoot me the date thereafter?   Thanks [employer]

## 2023-04-26 NOTE — Addendum Note (Signed)
 Addended by: Geralyn Flash D on: 04/26/2023 10:58 AM   Modules accepted: Orders

## 2023-04-26 NOTE — Progress Notes (Signed)
 Remote ICD transmission.

## 2023-05-05 ENCOUNTER — Other Ambulatory Visit (HOSPITAL_COMMUNITY): Payer: Self-pay | Admitting: Internal Medicine

## 2023-05-05 DIAGNOSIS — I5022 Chronic systolic (congestive) heart failure: Secondary | ICD-10-CM

## 2023-05-17 ENCOUNTER — Other Ambulatory Visit: Payer: Self-pay | Admitting: Cardiology

## 2023-05-17 DIAGNOSIS — I5022 Chronic systolic (congestive) heart failure: Secondary | ICD-10-CM

## 2023-06-12 ENCOUNTER — Ambulatory Visit: Payer: BC Managed Care – PPO

## 2023-06-12 DIAGNOSIS — I5020 Unspecified systolic (congestive) heart failure: Secondary | ICD-10-CM

## 2023-06-12 DIAGNOSIS — I255 Ischemic cardiomyopathy: Secondary | ICD-10-CM | POA: Diagnosis not present

## 2023-06-13 DIAGNOSIS — R5383 Other fatigue: Secondary | ICD-10-CM | POA: Diagnosis not present

## 2023-06-13 DIAGNOSIS — E785 Hyperlipidemia, unspecified: Secondary | ICD-10-CM | POA: Diagnosis not present

## 2023-06-13 LAB — CUP PACEART REMOTE DEVICE CHECK
Battery Remaining Longevity: 95 mo
Battery Remaining Percentage: 78 %
Battery Voltage: 2.99 V
Brady Statistic RV Percent Paced: 2.3 %
Date Time Interrogation Session: 20250414030852
HighPow Impedance: 72 Ohm
Implantable Lead Connection Status: 753985
Implantable Lead Implant Date: 20230116
Implantable Lead Location: 753860
Implantable Pulse Generator Implant Date: 20230116
Lead Channel Impedance Value: 380 Ohm
Lead Channel Pacing Threshold Amplitude: 0.5 V
Lead Channel Pacing Threshold Pulse Width: 0.5 ms
Lead Channel Sensing Intrinsic Amplitude: 11.8 mV
Lead Channel Setting Pacing Amplitude: 2.5 V
Lead Channel Setting Pacing Pulse Width: 0.5 ms
Lead Channel Setting Sensing Sensitivity: 0.5 mV
Pulse Gen Serial Number: 111046020

## 2023-06-15 DIAGNOSIS — K219 Gastro-esophageal reflux disease without esophagitis: Secondary | ICD-10-CM | POA: Diagnosis not present

## 2023-06-15 DIAGNOSIS — E039 Hypothyroidism, unspecified: Secondary | ICD-10-CM | POA: Diagnosis not present

## 2023-06-15 DIAGNOSIS — E782 Mixed hyperlipidemia: Secondary | ICD-10-CM | POA: Diagnosis not present

## 2023-06-15 DIAGNOSIS — Z789 Other specified health status: Secondary | ICD-10-CM | POA: Diagnosis not present

## 2023-06-16 ENCOUNTER — Other Ambulatory Visit: Payer: Self-pay | Admitting: Family Medicine

## 2023-06-16 ENCOUNTER — Encounter: Payer: Self-pay | Admitting: Internal Medicine

## 2023-06-16 DIAGNOSIS — N644 Mastodynia: Secondary | ICD-10-CM

## 2023-06-16 DIAGNOSIS — N6310 Unspecified lump in the right breast, unspecified quadrant: Secondary | ICD-10-CM

## 2023-07-19 ENCOUNTER — Ambulatory Visit
Admission: RE | Admit: 2023-07-19 | Discharge: 2023-07-19 | Disposition: A | Discharge status: Home / Self Care | Source: Ambulatory Visit | Attending: Family Medicine | Admitting: Family Medicine

## 2023-07-19 ENCOUNTER — Ambulatory Visit

## 2023-07-19 DIAGNOSIS — N62 Hypertrophy of breast: Secondary | ICD-10-CM | POA: Diagnosis not present

## 2023-07-19 DIAGNOSIS — N6310 Unspecified lump in the right breast, unspecified quadrant: Secondary | ICD-10-CM

## 2023-07-19 DIAGNOSIS — N644 Mastodynia: Secondary | ICD-10-CM

## 2023-08-03 NOTE — Progress Notes (Signed)
 Remote ICD transmission.

## 2023-08-03 NOTE — Addendum Note (Signed)
 Addended by: Edra Govern D on: 08/03/2023 01:50 PM   Modules accepted: Orders

## 2023-09-07 DIAGNOSIS — M533 Sacrococcygeal disorders, not elsewhere classified: Secondary | ICD-10-CM | POA: Diagnosis not present

## 2023-09-07 DIAGNOSIS — M545 Low back pain, unspecified: Secondary | ICD-10-CM | POA: Diagnosis not present

## 2023-09-08 ENCOUNTER — Encounter: Payer: Self-pay | Admitting: Cardiology

## 2023-09-08 NOTE — Telephone Encounter (Signed)
 Short term use may be okay but avoid long term use. If no alternative to Meloxicam for pain control, could consider switching Aspirin  to Plavix. Bleeding risk would still be there, but Plavix has slightly less stomach bleeding risk than Aspirin .  Thanks MJP

## 2023-09-11 ENCOUNTER — Ambulatory Visit: Payer: Self-pay | Admitting: Internal Medicine

## 2023-09-11 ENCOUNTER — Ambulatory Visit: Payer: BC Managed Care – PPO

## 2023-09-11 DIAGNOSIS — I255 Ischemic cardiomyopathy: Secondary | ICD-10-CM

## 2023-09-11 LAB — CUP PACEART REMOTE DEVICE CHECK
Battery Remaining Longevity: 94 mo
Battery Remaining Percentage: 76 %
Battery Voltage: 2.99 V
Brady Statistic RV Percent Paced: 2.5 %
Date Time Interrogation Session: 20250714024544
HighPow Impedance: 71 Ohm
Implantable Lead Connection Status: 753985
Implantable Lead Implant Date: 20230116
Implantable Lead Location: 753860
Implantable Pulse Generator Implant Date: 20230116
Lead Channel Impedance Value: 410 Ohm
Lead Channel Pacing Threshold Amplitude: 0.5 V
Lead Channel Pacing Threshold Pulse Width: 0.5 ms
Lead Channel Sensing Intrinsic Amplitude: 11.8 mV
Lead Channel Setting Pacing Amplitude: 2.5 V
Lead Channel Setting Pacing Pulse Width: 0.5 ms
Lead Channel Setting Sensing Sensitivity: 0.5 mV
Pulse Gen Serial Number: 111046020

## 2023-11-03 DIAGNOSIS — Z125 Encounter for screening for malignant neoplasm of prostate: Secondary | ICD-10-CM | POA: Diagnosis not present

## 2023-11-03 DIAGNOSIS — I10 Essential (primary) hypertension: Secondary | ICD-10-CM | POA: Diagnosis not present

## 2023-11-03 DIAGNOSIS — E781 Pure hyperglyceridemia: Secondary | ICD-10-CM | POA: Diagnosis not present

## 2023-11-07 DIAGNOSIS — I10 Essential (primary) hypertension: Secondary | ICD-10-CM | POA: Diagnosis not present

## 2023-11-07 DIAGNOSIS — Z0000 Encounter for general adult medical examination without abnormal findings: Secondary | ICD-10-CM | POA: Diagnosis not present

## 2023-11-07 DIAGNOSIS — N62 Hypertrophy of breast: Secondary | ICD-10-CM | POA: Diagnosis not present

## 2023-11-07 DIAGNOSIS — Z23 Encounter for immunization: Secondary | ICD-10-CM | POA: Diagnosis not present

## 2023-11-07 DIAGNOSIS — Z7185 Encounter for immunization safety counseling: Secondary | ICD-10-CM | POA: Diagnosis not present

## 2023-11-15 DIAGNOSIS — I519 Heart disease, unspecified: Secondary | ICD-10-CM | POA: Diagnosis not present

## 2023-11-15 DIAGNOSIS — Z7185 Encounter for immunization safety counseling: Secondary | ICD-10-CM | POA: Diagnosis not present

## 2023-11-15 DIAGNOSIS — Z23 Encounter for immunization: Secondary | ICD-10-CM | POA: Diagnosis not present

## 2023-12-07 NOTE — Progress Notes (Signed)
 Remote ICD Transmission

## 2023-12-11 ENCOUNTER — Ambulatory Visit (INDEPENDENT_AMBULATORY_CARE_PROVIDER_SITE_OTHER): Payer: BC Managed Care – PPO

## 2023-12-11 DIAGNOSIS — I255 Ischemic cardiomyopathy: Secondary | ICD-10-CM

## 2023-12-11 LAB — CUP PACEART REMOTE DEVICE CHECK
Battery Remaining Longevity: 91 mo
Battery Remaining Percentage: 75 %
Battery Voltage: 2.99 V
Brady Statistic RV Percent Paced: 2.7 %
Date Time Interrogation Session: 20251013020106
HighPow Impedance: 75 Ohm
Implantable Lead Connection Status: 753985
Implantable Lead Implant Date: 20230116
Implantable Lead Location: 753860
Implantable Pulse Generator Implant Date: 20230116
Lead Channel Impedance Value: 440 Ohm
Lead Channel Pacing Threshold Amplitude: 0.5 V
Lead Channel Pacing Threshold Pulse Width: 0.5 ms
Lead Channel Sensing Intrinsic Amplitude: 11.8 mV
Lead Channel Setting Pacing Amplitude: 2.5 V
Lead Channel Setting Pacing Pulse Width: 0.5 ms
Lead Channel Setting Sensing Sensitivity: 0.5 mV
Pulse Gen Serial Number: 111046020

## 2023-12-13 NOTE — Progress Notes (Signed)
 Remote ICD Transmission

## 2023-12-14 ENCOUNTER — Ambulatory Visit: Payer: Self-pay | Admitting: Internal Medicine

## 2024-01-23 ENCOUNTER — Encounter: Payer: Self-pay | Admitting: Cardiology

## 2024-03-04 ENCOUNTER — Ambulatory Visit (HOSPITAL_COMMUNITY)
Admission: RE | Admit: 2024-03-04 | Discharge: 2024-03-04 | Disposition: A | Discharge status: Home / Self Care | Payer: BC Managed Care – PPO | Source: Ambulatory Visit

## 2024-03-04 DIAGNOSIS — I5020 Unspecified systolic (congestive) heart failure: Secondary | ICD-10-CM | POA: Diagnosis present

## 2024-03-04 LAB — ECHOCARDIOGRAM COMPLETE
Area-P 1/2: 6.54 cm2
S' Lateral: 4.2 cm

## 2024-03-04 MED ORDER — PERFLUTREN LIPID MICROSPHERE
1.0000 mL | INTRAVENOUS | Status: AC | PRN
  Administered 2024-03-04: 2 mL via INTRAVENOUS

## 2024-03-05 ENCOUNTER — Ambulatory Visit: Payer: Self-pay | Admitting: Cardiology

## 2024-03-11 ENCOUNTER — Ambulatory Visit: Payer: BC Managed Care – PPO

## 2024-03-11 DIAGNOSIS — I255 Ischemic cardiomyopathy: Secondary | ICD-10-CM

## 2024-03-12 LAB — CUP PACEART REMOTE DEVICE CHECK
Battery Remaining Longevity: 88 mo
Battery Remaining Percentage: 72 %
Battery Voltage: 2.99 V
Brady Statistic RV Percent Paced: 2.8 %
Date Time Interrogation Session: 20260112020123
HighPow Impedance: 74 Ohm
Implantable Lead Connection Status: 753985
Implantable Lead Implant Date: 20230116
Implantable Lead Location: 753860
Implantable Pulse Generator Implant Date: 20230116
Lead Channel Impedance Value: 410 Ohm
Lead Channel Pacing Threshold Amplitude: 0.5 V
Lead Channel Pacing Threshold Pulse Width: 0.5 ms
Lead Channel Sensing Intrinsic Amplitude: 11.8 mV
Lead Channel Setting Pacing Amplitude: 2.5 V
Lead Channel Setting Pacing Pulse Width: 0.5 ms
Lead Channel Setting Sensing Sensitivity: 0.5 mV
Pulse Gen Serial Number: 111046020

## 2024-03-13 ENCOUNTER — Ambulatory Visit: Payer: Self-pay | Admitting: Cardiology

## 2024-03-15 NOTE — Progress Notes (Signed)
 Remote ICD Transmission

## 2024-05-03 ENCOUNTER — Ambulatory Visit: Admitting: Cardiology
